# Patient Record
Sex: Male | Born: 1945
Health system: Southern US, Community
[De-identification: ages and names within clinical notes are randomized; demographics above are authoritative.]

## PROBLEM LIST (undated history)

## (undated) DIAGNOSIS — I493 Ventricular premature depolarization: Secondary | ICD-10-CM

## (undated) DIAGNOSIS — I10 Essential (primary) hypertension: Secondary | ICD-10-CM

## (undated) DIAGNOSIS — F32A Depression, unspecified: Secondary | ICD-10-CM

## (undated) DIAGNOSIS — F329 Major depressive disorder, single episode, unspecified: Secondary | ICD-10-CM

## (undated) DIAGNOSIS — F410 Panic disorder [episodic paroxysmal anxiety] without agoraphobia: Secondary | ICD-10-CM

## (undated) HISTORY — DX: Essential (primary) hypertension: I10

## (undated) HISTORY — PX: TYMPANIC MEMBRANE REPAIR: SHX294

## (undated) HISTORY — DX: Major depressive disorder, single episode, unspecified: F32.9

## (undated) HISTORY — DX: Depression, unspecified: F32.A

## (undated) HISTORY — PX: FRACTURE SURGERY: SHX138

## (undated) HISTORY — DX: Ventricular premature depolarization: I49.3

## (undated) HISTORY — DX: Panic disorder (episodic paroxysmal anxiety): F41.0

---

## 2006-05-23 ENCOUNTER — Ambulatory Visit: Payer: Self-pay | Admitting: Internal Medicine

## 2006-09-05 HISTORY — PX: EYE SURGERY: SHX253

## 2006-11-08 ENCOUNTER — Ambulatory Visit: Payer: Self-pay | Admitting: Unknown Physician Specialty

## 2010-01-11 ENCOUNTER — Ambulatory Visit: Payer: Self-pay | Admitting: Internal Medicine

## 2010-09-27 ENCOUNTER — Ambulatory Visit: Payer: Self-pay | Admitting: Internal Medicine

## 2011-05-10 ENCOUNTER — Other Ambulatory Visit: Payer: Self-pay | Admitting: Internal Medicine

## 2011-05-10 MED ORDER — CITALOPRAM HYDROBROMIDE 10 MG PO TABS: 10.0000 mg | ORAL_TABLET | Freq: Every day | ORAL | Status: DC

## 2011-05-10 NOTE — Telephone Encounter (Signed)
Please send refill for Citalopram 10 mg to [facility] in [place] [phone]

## 2011-06-01 ENCOUNTER — Other Ambulatory Visit: Payer: Self-pay | Admitting: Internal Medicine

## 2011-06-01 MED ORDER — FUROSEMIDE 20 MG PO TABS: 20.0000 mg | ORAL_TABLET | Freq: Every day | ORAL | Status: DC

## 2011-08-04 ENCOUNTER — Encounter: Payer: Self-pay | Admitting: Internal Medicine

## 2011-08-04 ENCOUNTER — Ambulatory Visit (INDEPENDENT_AMBULATORY_CARE_PROVIDER_SITE_OTHER): Payer: Self-pay | Admitting: Internal Medicine

## 2011-08-04 VITALS — BP 134/76 | HR 77 | Temp 97.6°F | Resp 16 | Ht 69.0 in | Wt 197.0 lb

## 2011-08-04 DIAGNOSIS — Z23 Encounter for immunization: Secondary | ICD-10-CM

## 2011-08-04 DIAGNOSIS — Z79899 Other long term (current) drug therapy: Secondary | ICD-10-CM

## 2011-08-04 DIAGNOSIS — Z13220 Encounter for screening for lipoid disorders: Secondary | ICD-10-CM

## 2011-08-04 DIAGNOSIS — Z1211 Encounter for screening for malignant neoplasm of colon: Secondary | ICD-10-CM

## 2011-08-04 DIAGNOSIS — M545 Low back pain: Secondary | ICD-10-CM

## 2011-08-04 DIAGNOSIS — Z125 Encounter for screening for malignant neoplasm of prostate: Secondary | ICD-10-CM

## 2011-08-04 DIAGNOSIS — R05 Cough: Secondary | ICD-10-CM

## 2011-08-04 DIAGNOSIS — F410 Panic disorder [episodic paroxysmal anxiety] without agoraphobia: Secondary | ICD-10-CM

## 2011-08-04 DIAGNOSIS — I10 Essential (primary) hypertension: Secondary | ICD-10-CM

## 2011-08-04 LAB — BASIC METABOLIC PANEL
BUN: 22 mg/dL (ref 6–23)
Calcium: 9.5 mg/dL (ref 8.4–10.5)
GFR: 70.66 mL/min (ref >60.00)
Glucose, Bld: 90 mg/dL (ref 70–99)

## 2011-08-04 LAB — HM COLONOSCOPY

## 2011-08-04 MED ORDER — LOSARTAN POTASSIUM 50 MG PO TABS: 50.0000 mg | ORAL_TABLET | Freq: Every day | ORAL | Status: DC

## 2011-08-04 NOTE — Progress Notes (Signed)
Subjective:    Patient ID: Austin Duran, male    DOB: 10-04-1945, 65 y.o.   MRN: [id number]  HPI  65 yo white male here for 6 month followup on hypertension, depression.  He has been having more lumbar back pain lately due to increased actitivity.  No radiculpathy or numbness, and pai is controlled with NSAIDS/analgesics OTC.  Worse with prlonged bending over /stooping.  N recent falls or accidents.  Past Medical History  Diagnosis Date  . Hearing loss     had a perforated right TM,   followed by Dr. Jenne Campus  . Panic attack   . Hypertension   . Depression   . Panic attacks     Current Outpatient Prescriptions on File Prior to Visit  Medication Sig Dispense Refill  . citalopram (CELEXA) 10 MG tablet Take 1 tablet (10 mg total) by mouth daily.  30 tablet  3  . furosemide (LASIX) 20 MG tablet Take 1 tablet (20 mg total) by mouth daily.  30 tablet  3     Review of Systems  Constitutional: Negative for fever, chills, diaphoresis, activity change, appetite change, fatigue and unexpected weight change.  HENT: Negative for hearing loss, ear pain, nosebleeds, congestion, sore throat, facial swelling, rhinorrhea, sneezing, drooling, mouth sores, trouble swallowing, neck pain, neck stiffness, dental problem, voice change, postnasal drip, sinus pressure, tinnitus and ear discharge.   Eyes: Negative for photophobia, pain, discharge, redness, itching and visual disturbance.  Respiratory: Negative for apnea, cough, choking, chest tightness, shortness of breath, wheezing and stridor.   Cardiovascular: Negative for chest pain, palpitations and leg swelling.  Gastrointestinal: Negative for nausea, vomiting, abdominal pain, diarrhea, constipation, blood in stool, abdominal distention, anal bleeding and rectal pain.  Genitourinary: Negative for dysuria, urgency, frequency, hematuria, flank pain, decreased urine volume, scrotal swelling, difficulty urinating and testicular pain.  Musculoskeletal: Positive  for back pain. Negative for myalgias, joint swelling, arthralgias and gait problem.  Skin: Negative for color change, rash and wound.  Neurological: Negative for dizziness, tremors, seizures, syncope, speech difficulty, weakness, light-headedness, numbness and headaches.  Psychiatric/Behavioral: Negative for suicidal ideas, hallucinations, behavioral problems, confusion, sleep disturbance, dysphoric mood, decreased concentration and agitation. The patient is not nervous/anxious.       BP 134/76  Pulse 77  Temp(Src) 97.6 F (36.4 C) (Oral)  Resp 16  Ht 5\' 9"  (1.753 m)  Wt 197 lb (89.359 kg)  BMI 29.09 kg/m2  SpO2 96%  Objective:   Physical Exam  Constitutional: He is oriented to person, place, and time.  HENT:  Head: Normocephalic and atraumatic.  Mouth/Throat: Oropharynx is clear and moist.  Eyes: Conjunctivae and EOM are normal.  Neck: Normal range of motion. Neck supple. No JVD present. No thyromegaly present.  Cardiovascular: Normal rate, regular rhythm and normal heart sounds.   Pulmonary/Chest: Effort normal and breath sounds normal. He has no wheezes. He has no rales.  Abdominal: Soft. Bowel sounds are normal. He exhibits no mass. There is no tenderness. There is no rebound.  Musculoskeletal: Normal range of motion. He exhibits no edema.  Neurological: He is alert and oriented to person, place, and time.  Skin: Skin is warm and dry.  Psychiatric: He has a normal mood and affect.       Assessment & Plan:   Screening for colon cancer He has refused colonoscopy.  Last FOBT that was actually returned was 2009, despite annual reminders.   Panic attacks Controlled with celexa.  No changes today.  Screening for hyperlipidemia Fasting  lipid panel was May 2012.  LDL was 84, HDL  Low at 32, trigs 95.  We have discussed the Mediterranean Diet in the past for lifestyle dietary changes which may improve his HDL.  Cough He was evaluated in May for cough with a normal chest x  ray.  Cough wasdue to ACE Inhibitor and has resolved with change in antihypertensive to Losartan  Lumbago Mild, episodic, without radiculopathy.  He has a history of cervical disk disease so likley has dgenerative changes in the lumbar spine as well.  Will defer workup at this time due to mildness of symptoms.   Continue NSAID/analgesics prn and continue regular exercise.     Updated Medication List Outpatient Encounter Prescriptions as of 08/04/2011  Medication Sig Dispense Refill  . aspirin 81 MG tablet Take 81 mg by mouth daily.        . citalopram (CELEXA) 10 MG tablet Take 1 tablet (10 mg total) by mouth daily.  30 tablet  3  . furosemide (LASIX) 20 MG tablet Take 1 tablet (20 mg total) by mouth daily.  30 tablet  3  . losartan (COZAAR) 50 MG tablet Take 1 tablet (50 mg total) by mouth daily.  90 tablet  3  . DISCONTD: losartan (COZAAR) 50 MG tablet Take 50 mg by mouth daily.

## 2011-08-07 ENCOUNTER — Encounter: Payer: Self-pay | Admitting: Internal Medicine

## 2011-08-07 DIAGNOSIS — F410 Panic disorder [episodic paroxysmal anxiety] without agoraphobia: Secondary | ICD-10-CM | POA: Insufficient documentation

## 2011-08-07 DIAGNOSIS — E785 Hyperlipidemia, unspecified: Secondary | ICD-10-CM | POA: Insufficient documentation

## 2011-08-07 DIAGNOSIS — Z125 Encounter for screening for malignant neoplasm of prostate: Secondary | ICD-10-CM | POA: Insufficient documentation

## 2011-08-07 DIAGNOSIS — M545 Low back pain: Secondary | ICD-10-CM | POA: Insufficient documentation

## 2011-08-07 DIAGNOSIS — R05 Cough: Secondary | ICD-10-CM | POA: Insufficient documentation

## 2011-08-07 DIAGNOSIS — Z1211 Encounter for screening for malignant neoplasm of colon: Secondary | ICD-10-CM | POA: Insufficient documentation

## 2011-08-07 NOTE — Assessment & Plan Note (Signed)
Fasting lipid panel was May 2012.  LDL was 84, HDL  Low at 32, trigs 95.  We have discussed the Mediterranean Diet in the past for lifestyle dietary changes which may improve his HDL.

## 2011-08-07 NOTE — Assessment & Plan Note (Signed)
Controlled with celexa.  No changes today.

## 2011-08-07 NOTE — Assessment & Plan Note (Signed)
He has refused colonoscopy.  Last FOBT that was actually returned was 2009, despite annual reminders.

## 2011-08-07 NOTE — Patient Instructions (Signed)
Your blood pressure is well controlled.  You are overdue for annual screening for colon CA using the take home fecal occult blood test.  Please do these as soon as possible.

## 2011-08-07 NOTE — Assessment & Plan Note (Signed)
He was evaluated in May for cough with a normal chest x ray.  Cough wasdue to ACE Inhibitor and has resolved with change in antihypertensive to Losartan

## 2011-08-07 NOTE — Assessment & Plan Note (Signed)
Mild, episodic, without radiculopathy.  He has a history of cervical disk disease so likley has dgenerative changes in the lumbar spine as well.  Will defer workup at this time due to mildness of symptoms.   Continue NSAID/analgesics prn and continue regular exercise.

## 2011-08-24 ENCOUNTER — Encounter: Payer: Self-pay | Admitting: Internal Medicine

## 2011-09-02 ENCOUNTER — Other Ambulatory Visit: Payer: Self-pay | Admitting: Internal Medicine

## 2011-09-02 MED ORDER — ALPRAZOLAM 0.5 MG PO TABS: 0.5000 mg | ORAL_TABLET | Freq: Two times a day (BID) | ORAL | Status: AC | PRN

## 2011-09-02 MED ORDER — CITALOPRAM HYDROBROMIDE 20 MG PO TABS: 20.0000 mg | ORAL_TABLET | Freq: Every day | ORAL | Status: DC

## 2011-09-02 NOTE — Telephone Encounter (Signed)
Patient's wife wants to know if you can bump up his Citalopram he has had several anxiety attacks over the last couple of weeks.

## 2011-09-02 NOTE — Telephone Encounter (Signed)
Spoke with patient and confirmed that he is having some panic attack issues. He has been very nervous and unable to sit still or concentrate. He said he almost feels claustrophobic in his car as well. He denies CP,SOB,diaphoresis, SI or HI, no thoughts of impending doom. He said he has been taking an extra citalopram when he has these attacks and hasn't noticed much difference. I explained that the medicine needed to build up in his system in order to notice any changes and that there may be something you could send in for immediate relief until increase in citalopram could get in his system, if you okayed the change. I explained that it may be Monday before he hears back from [place], but to check with his pharmacy tomorrow about any new meds. He verbalized understanding.

## 2011-09-02 NOTE — Telephone Encounter (Signed)
I really cannot adjust a patient's SSRI dose based on a request from his wife unless he is incompetent, which is not the case.  Can you call the patient to confirm that he is requesting a change in medication? thanks

## 2011-09-02 NOTE — Telephone Encounter (Signed)
Increase the citalopram to 20 mg daily , but this will not help immediately.  He needs an rx for alprazolam 0.5 mg one tablet twice daily if needed for panic attacks.  #60 no refills.  You can call to pharmacy.  I will update EPIC.

## 2011-09-02 NOTE — Telephone Encounter (Signed)
Rx called in as directed. Patient notified.  

## 2011-10-06 ENCOUNTER — Telehealth: Payer: Self-pay | Admitting: Internal Medicine

## 2011-10-06 NOTE — Telephone Encounter (Signed)
Pt has cough with green mucous, sinus pain.  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

## 2011-10-06 NOTE — Telephone Encounter (Signed)
Yes, please schedule for Friday

## 2011-10-06 NOTE — Telephone Encounter (Signed)
yes

## 2011-10-06 NOTE — Telephone Encounter (Signed)
Vernona Rieger when can i put this pt in on Friday  Thanks  robin

## 2011-10-06 NOTE — Telephone Encounter (Signed)
10:45

## 2011-10-06 NOTE — Telephone Encounter (Signed)
Patient is coughing up dark mucous and headache.Patient is wanting to be seen on Friday.

## 2011-10-07 ENCOUNTER — Encounter: Payer: Self-pay | Admitting: Internal Medicine

## 2011-10-07 ENCOUNTER — Ambulatory Visit (INDEPENDENT_AMBULATORY_CARE_PROVIDER_SITE_OTHER): Payer: Medicare Other | Admitting: Internal Medicine

## 2011-10-07 VITALS — BP 132/78 | HR 76 | Temp 97.8°F | Wt 206.0 lb

## 2011-10-07 DIAGNOSIS — H6690 Otitis media, unspecified, unspecified ear: Secondary | ICD-10-CM

## 2011-10-07 DIAGNOSIS — J0100 Acute maxillary sinusitis, unspecified: Secondary | ICD-10-CM | POA: Diagnosis not present

## 2011-10-07 MED ORDER — PREDNISONE (PAK) 10 MG PO TABS: ORAL_TABLET | ORAL | Status: AC

## 2011-10-07 MED ORDER — AMOXICILLIN-POT CLAVULANATE 875-125 MG PO TABS: 1.0000 | ORAL_TABLET | Freq: Two times a day (BID) | ORAL | Status: AC

## 2011-10-07 NOTE — Patient Instructions (Signed)
Have bronchitis and sinusitis/otitis .[name]  I am prescribing augmentin twice daily and prednisone taper  For 6 -7 days until finished.  For the first few days  Take OTC Sudafed PE  (10 mg every 6 hours unitl evening) for congestion ,  Then stop.    If still congested at night,  Use Afrin nasal spray for 4 to 5 days maximum   Ok to use Simply Saline nasal spray twice daily to flush sinuses (use before the afrin )   Use the benzonatate capsules for cough every 8 hours if needed

## 2011-10-07 NOTE — Progress Notes (Signed)
  Subjective:    Patient ID: Austin Duran, male    DOB: March 27, 1946, 66 y.o.   MRN: [id number]  HPI  is a 66 year old white male with a history of hypertension depression with anxiety attacks presents with a 2 week history of sinus congestion rhinitis postnasal drip headaches with more recent development of pain occurring under the left eye  accompanied by purulent blood-streaked mucoid discharge he has been using over-the-counter  Remedies Coricidin and Mucinex DM without any significant change in his symptoms. He denies fevers nausea vomiting or diarrhea. He did have an influenza vaccine this year and has had no recent sick contacts. Past Medical History  Diagnosis Date  . Hearing loss     had a perforated right TM,   followed by Dr. Jenne Campus  . Panic attack   . Hypertension   . Depression   . Panic attacks    Current Outpatient Prescriptions on File Prior to Visit  Medication Sig Dispense Refill  . aspirin 81 MG tablet Take 81 mg by mouth daily.        . citalopram (CELEXA) 20 MG tablet Take 1 tablet (20 mg total) by mouth daily.  30 tablet  3  . furosemide (LASIX) 20 MG tablet Take 1 tablet (20 mg total) by mouth daily.  30 tablet  3  . losartan (COZAAR) 50 MG tablet Take 1 tablet (50 mg total) by mouth daily.  90 tablet  3    Review of Systems  Constitutional: Negative for fever, chills, activity change and fatigue.  HENT: Positive for ear pain, congestion, postnasal drip and sinus pressure. Negative for hearing loss, nosebleeds, sore throat, rhinorrhea, sneezing, trouble swallowing, neck pain, neck stiffness, voice change, tinnitus and ear discharge.   Eyes: Negative for discharge, redness, itching and visual disturbance.  Respiratory: Negative for cough, chest tightness, shortness of breath, wheezing and stridor.   Cardiovascular: Negative for chest pain and leg swelling.  Musculoskeletal: Negative for myalgias and arthralgias.  Skin: Negative for color change and rash.    Neurological: Negative for dizziness, facial asymmetry and headaches.  Psychiatric/Behavioral: Negative for sleep disturbance.       Objective:   Physical Exam  Constitutional: He is oriented to person, place, and time.  HENT:  Head: Normocephalic and atraumatic.  Right Ear: Tympanic membrane is injected. A middle ear effusion is present.  Left Ear: Tympanic membrane is injected. A middle ear effusion is present.  Nose: Right sinus exhibits maxillary sinus tenderness. Left sinus exhibits maxillary sinus tenderness.  Mouth/Throat: Oropharynx is clear and moist.  Eyes: Conjunctivae and EOM are normal.  Neck: Normal range of motion. Neck supple. No JVD present. No thyromegaly present.  Cardiovascular: Normal rate, regular rhythm and normal heart sounds.   Pulmonary/Chest: Effort normal and breath sounds normal. He has no wheezes. He has no rales.  Abdominal: Soft. Bowel sounds are normal. He exhibits no mass. There is no tenderness. There is no rebound.  Musculoskeletal: Normal range of motion. He exhibits no edema.  Neurological: He is alert and oriented to person, place, and time.  Skin: Skin is warm and dry.  Psychiatric: He has a normal mood and affect.      Assessment & Plan:  Sinusitis with otitis: will treat with prednisone and augmentin, OTC sudafed PE if tolerated by BP

## 2011-10-07 NOTE — Telephone Encounter (Signed)
appt 10/07/11 @ 4:30 ok per laurie

## 2011-10-09 ENCOUNTER — Encounter: Payer: Self-pay | Admitting: Internal Medicine

## 2011-10-09 DIAGNOSIS — J0100 Acute maxillary sinusitis, unspecified: Secondary | ICD-10-CM | POA: Insufficient documentation

## 2011-10-10 ENCOUNTER — Ambulatory Visit: Payer: BC Managed Care – PPO | Admitting: Internal Medicine

## 2011-10-10 ENCOUNTER — Other Ambulatory Visit: Payer: Self-pay | Admitting: *Deleted

## 2011-10-10 MED ORDER — FUROSEMIDE 20 MG PO TABS: 20.0000 mg | ORAL_TABLET | Freq: Every day | ORAL | Status: DC

## 2011-10-11 ENCOUNTER — Ambulatory Visit: Payer: BC Managed Care – PPO | Admitting: Internal Medicine

## 2011-10-28 DIAGNOSIS — H2510 Age-related nuclear cataract, unspecified eye: Secondary | ICD-10-CM | POA: Diagnosis not present

## 2011-11-04 DIAGNOSIS — L819 Disorder of pigmentation, unspecified: Secondary | ICD-10-CM | POA: Diagnosis not present

## 2011-11-04 DIAGNOSIS — D239 Other benign neoplasm of skin, unspecified: Secondary | ICD-10-CM | POA: Diagnosis not present

## 2011-11-04 DIAGNOSIS — Z85828 Personal history of other malignant neoplasm of skin: Secondary | ICD-10-CM | POA: Diagnosis not present

## 2011-11-25 DIAGNOSIS — H2510 Age-related nuclear cataract, unspecified eye: Secondary | ICD-10-CM | POA: Diagnosis not present

## 2011-12-06 ENCOUNTER — Ambulatory Visit: Payer: Self-pay | Admitting: Ophthalmology

## 2011-12-06 DIAGNOSIS — I10 Essential (primary) hypertension: Secondary | ICD-10-CM | POA: Diagnosis not present

## 2011-12-06 DIAGNOSIS — F329 Major depressive disorder, single episode, unspecified: Secondary | ICD-10-CM | POA: Diagnosis not present

## 2011-12-06 DIAGNOSIS — R12 Heartburn: Secondary | ICD-10-CM | POA: Diagnosis not present

## 2011-12-06 DIAGNOSIS — M19029 Primary osteoarthritis, unspecified elbow: Secondary | ICD-10-CM | POA: Diagnosis not present

## 2011-12-06 DIAGNOSIS — G4733 Obstructive sleep apnea (adult) (pediatric): Secondary | ICD-10-CM | POA: Diagnosis not present

## 2011-12-06 DIAGNOSIS — K219 Gastro-esophageal reflux disease without esophagitis: Secondary | ICD-10-CM | POA: Diagnosis not present

## 2011-12-06 DIAGNOSIS — Z9109 Other allergy status, other than to drugs and biological substances: Secondary | ICD-10-CM | POA: Diagnosis not present

## 2011-12-06 DIAGNOSIS — F410 Panic disorder [episodic paroxysmal anxiety] without agoraphobia: Secondary | ICD-10-CM | POA: Diagnosis not present

## 2011-12-06 DIAGNOSIS — H2510 Age-related nuclear cataract, unspecified eye: Secondary | ICD-10-CM | POA: Diagnosis not present

## 2011-12-06 DIAGNOSIS — Z87891 Personal history of nicotine dependence: Secondary | ICD-10-CM | POA: Diagnosis not present

## 2011-12-06 DIAGNOSIS — Z79899 Other long term (current) drug therapy: Secondary | ICD-10-CM | POA: Diagnosis not present

## 2011-12-06 DIAGNOSIS — M19019 Primary osteoarthritis, unspecified shoulder: Secondary | ICD-10-CM | POA: Diagnosis not present

## 2011-12-06 DIAGNOSIS — Z7982 Long term (current) use of aspirin: Secondary | ICD-10-CM | POA: Diagnosis not present

## 2012-01-02 ENCOUNTER — Encounter: Payer: Self-pay | Admitting: Internal Medicine

## 2012-01-02 ENCOUNTER — Ambulatory Visit (INDEPENDENT_AMBULATORY_CARE_PROVIDER_SITE_OTHER): Payer: Medicare Other | Admitting: Internal Medicine

## 2012-01-02 VITALS — BP 126/74 | HR 70 | Temp 98.3°F | Resp 16 | Ht 69.0 in | Wt 206.0 lb

## 2012-01-02 DIAGNOSIS — Z0000 Encounter for general adult medical examination without abnormal findings: Secondary | ICD-10-CM | POA: Diagnosis not present

## 2012-01-02 DIAGNOSIS — R5381 Other malaise: Secondary | ICD-10-CM

## 2012-01-02 DIAGNOSIS — E785 Hyperlipidemia, unspecified: Secondary | ICD-10-CM | POA: Diagnosis not present

## 2012-01-02 DIAGNOSIS — E669 Obesity, unspecified: Secondary | ICD-10-CM | POA: Diagnosis not present

## 2012-01-02 DIAGNOSIS — R5383 Other fatigue: Secondary | ICD-10-CM

## 2012-01-02 DIAGNOSIS — Z125 Encounter for screening for malignant neoplasm of prostate: Secondary | ICD-10-CM

## 2012-01-02 DIAGNOSIS — C44519 Basal cell carcinoma of skin of other part of trunk: Secondary | ICD-10-CM | POA: Insufficient documentation

## 2012-01-02 DIAGNOSIS — Z9841 Cataract extraction status, right eye: Secondary | ICD-10-CM

## 2012-01-02 DIAGNOSIS — H905 Unspecified sensorineural hearing loss: Secondary | ICD-10-CM | POA: Insufficient documentation

## 2012-01-02 DIAGNOSIS — H903 Sensorineural hearing loss, bilateral: Secondary | ICD-10-CM | POA: Insufficient documentation

## 2012-01-02 DIAGNOSIS — R05 Cough: Secondary | ICD-10-CM

## 2012-01-02 DIAGNOSIS — Z79899 Other long term (current) drug therapy: Secondary | ICD-10-CM

## 2012-01-02 LAB — CBC WITH DIFFERENTIAL/PLATELET
Basophils Relative: 0.7 % (ref 0.0–3.0)
Eosinophils Relative: 2.2 % (ref 0.0–5.0)
HCT: 45.3 % (ref 39.0–52.0)
Hemoglobin: 15.3 g/dL (ref 13.0–17.0)
Lymphs Abs: 1.5 10*3/uL (ref 0.7–4.0)
MCV: 90.9 fl (ref 78.0–100.0)
Monocytes Absolute: 0.6 10*3/uL (ref 0.1–1.0)
Neutro Abs: 3.7 10*3/uL (ref 1.4–7.7)
Platelets: 285 10*3/uL (ref 150.0–400.0)
RBC: 4.99 Mil/uL (ref 4.22–5.81)
WBC: 6 10*3/uL (ref 4.5–10.5)

## 2012-01-02 LAB — TSH: TSH: 1.08 u[IU]/mL (ref 0.35–5.50)

## 2012-01-02 LAB — LIPID PANEL
Cholesterol: 131 mg/dL (ref 0–200)
LDL Cholesterol: 84 mg/dL (ref 0–99)
VLDL: 19.2 mg/dL (ref 0.0–40.0)

## 2012-01-02 MED ORDER — BENZONATATE 200 MG PO CAPS: 200.0000 mg | ORAL_CAPSULE | Freq: Three times a day (TID) | ORAL | Status: DC | PRN

## 2012-01-02 NOTE — Assessment & Plan Note (Signed)
I have addressed  BMI and recommended a low glycemic index diet utilizing smaller more frequent meals to increase metabolism.  I have also recommended that patient start exercising with a goal of 30 minutes of aerobic exercise a minimum of 5 days per week. Screening for lipid disorders, thyroid and diabetes to be done today.   

## 2012-01-02 NOTE — Progress Notes (Signed)
Patient ID: Austin Duran, male   DOB: 12-28-1945, 66 y.o.   MRN: [id number]  The patient is here for annual Medicare wellness examination and management of other chronic and acute problems.   The risk factors are reflected in the social history.  The roster of all physicians providing medical care to patient - is listed in the Snapshot section of the chart.  Activities of daily living:  The patient is 100% independent in all ADLs: dressing, toileting, feeding as well as independent mobility  Home safety : The patient has smoke detectors in the home. They wear seatbelts.  There are no firearms at home. There is no violence in the home.   There is no risks for hepatitis, STDs or HIV. There is no   history of blood transfusion. They have no travel history to infectious disease endemic areas of the world.  The patient has seen their dentist in the last six month. They have seen their eye doctor in the last year. They admit to slight hearing difficulty with regard to whispered voices and some television programs.  They have deferred audiologic testing in the last year.  They do not  have excessive sun exposure. Discussed the need for sun protection: hats, long sleeves and use of sunscreen if there is significant sun exposure.   Diet: the importance of a healthy diet is discussed. They do have a healthy diet.  The benefits of regular aerobic exercise were discussed. She walks 4 times per week ,  20 minutes.   Depression screen: there are no signs or vegative symptoms of depression- irritability, change in appetite, anhedonia, sadness/tearfullness. He has a history of panic attacks, controled with daily citalopram.   Cognitive assessment: the patient manages all their financial and personal affairs and is actively engaged. They could relate day,date,year and events; recalled 2/3 objects at 3 minutes; performed clock-face test normally.  The following portions of the patient's history were reviewed and  updated as appropriate: allergies, current medications, past family history, past medical history,  past surgical history, past social history  and problem list.  Visual acuity was not assessed per patient preference since he has regular follow up with her ophthalmologist and has undergone cataract surgery recently . 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  body mass index were assessed and reviewed.   During the course of the visit the patient was educated and counseled about appropriate screening and preventive services including : fall prevention , diabetes screening, nutrition counseling, colorectal cancer screening, and recommended immunizations.    BP 126/74  Pulse 70  Temp(Src) 98.3 F (36.8 C) (Oral)  Resp 16  Ht 5\' 9"  (1.753 m)  Wt 206 lb (93.441 kg)  BMI 30.42 kg/m2  SpO2 97%  .BP 126/74  Pulse 70  Temp(Src) 98.3 F (36.8 C) (Oral)  Resp 16  Ht 5\' 9"  (1.753 m)  Wt 206 lb (93.441 kg)  BMI 30.42 kg/m2  SpO2 97%  General Appearance:    Alert, cooperative, no distress, appears stated age  Head:    Normocephalic, without obvious abnormality, atraumatic  Eyes:    PERRL, conjunctiva/corneas clear, EOM's intact, fundi    benign, both eyes       Ears:    Normal TM's and external ear canals, both ears  Nose:   Nares normal, septum midline, mucosa normal, no drainage   or sinus tenderness  Throat:   Lips, mucosa, and  tongue normal; teeth and gums normal  Neck:   Supple, symmetrical, trachea midline, no adenopathy;       thyroid:  No enlargement/tenderness/nodules; no carotid   bruit or JVD  Back:     Symmetric, no curvature, ROM normal, no CVA tenderness  Lungs:     Clear to auscultation bilaterally, respirations unlabored  Chest wall:    No tenderness or deformity  Heart:    Regular rate and rhythm, S1 and S2 normal, no murmur, rub   or gallop  Abdomen:     Soft, non-tender, bowel sounds  active all four quadrants,    no masses, no organomegaly  Genitalia:    Normal male without lesion, discharge or tenderness  Rectal:    Normal tone, normal prostate, no masses or tenderness;   guaiac negative stool  Extremities:   Extremities normal, atraumatic, no cyanosis or edema  Pulses:   2+ and symmetric all extremities  Skin:   Skin color, texture, turgor normal, no rashes or lesions  Lymph nodes:   Cervical, supraclavicular, and axillary nodes normal  Neurologic:   CNII-XII intact. Normal strength, sensation and reflexes      throughout   Assessment and Plan:  Basal cell carcinoma of back Taken  Off of back by Diona Browner last year  Encounter for Medicare annual wellness exam Prostate, testicular/genital exam and hernia check all done today . PSA drawn .  Obesity (BMI 30-39.9) I have addressed  BMI and recommended a low glycemic index diet utilizing smaller more frequent meals to increase metabolism.  I have also recommended that patient start exercising with a goal of 30 minutes of aerobic exercise a minimum of 5 days per week. Screening for lipid disorders, thyroid and diabetes to be done today.       Updated Medication List Outpatient Encounter Prescriptions as of 01/02/2012  Medication Sig Dispense Refill  . aspirin 81 MG tablet Take 81 mg by mouth daily.        . citalopram (CELEXA) 20 MG tablet Take 1 tablet (20 mg total) by mouth daily.  30 tablet  3  . furosemide (LASIX) 20 MG tablet Take 1 tablet (20 mg total) by mouth daily.  30 tablet  3  . losartan (COZAAR) 50 MG tablet Take 1 tablet (50 mg total) by mouth daily.  90 tablet  3  . benzonatate (TESSALON) 200 MG capsule Take 1 capsule (200 mg total) by mouth 3 (three) times daily as needed for cough.  60 capsule  1

## 2012-01-02 NOTE — Assessment & Plan Note (Signed)
Prostate, testicular/genital exam and hernia check all done today . PSA drawn .

## 2012-01-02 NOTE — Assessment & Plan Note (Signed)
Taken  Off of back by [employer] last year

## 2012-01-03 LAB — COMPLETE METABOLIC PANEL WITH GFR
AST: 19 U/L (ref 0–37)
Albumin: 4.5 g/dL (ref 3.5–5.2)
Alkaline Phosphatase: 71 U/L (ref 39–117)
BUN: 16 mg/dL (ref 6–23)
Potassium: 4.3 mEq/L (ref 3.5–5.3)
Total Bilirubin: 0.8 mg/dL (ref 0.3–1.2)

## 2012-01-04 HISTORY — PX: CATARACT EXTRACTION, BILATERAL: SHX1313

## 2012-01-09 DIAGNOSIS — H2510 Age-related nuclear cataract, unspecified eye: Secondary | ICD-10-CM | POA: Diagnosis not present

## 2012-01-10 ENCOUNTER — Other Ambulatory Visit: Payer: Self-pay | Admitting: *Deleted

## 2012-01-10 MED ORDER — CITALOPRAM HYDROBROMIDE 20 MG PO TABS: 20.0000 mg | ORAL_TABLET | Freq: Every day | ORAL | Status: DC

## 2012-01-17 ENCOUNTER — Ambulatory Visit: Payer: Self-pay | Admitting: Ophthalmology

## 2012-01-17 DIAGNOSIS — I10 Essential (primary) hypertension: Secondary | ICD-10-CM | POA: Diagnosis not present

## 2012-01-17 DIAGNOSIS — M19019 Primary osteoarthritis, unspecified shoulder: Secondary | ICD-10-CM | POA: Diagnosis not present

## 2012-01-17 DIAGNOSIS — H9190 Unspecified hearing loss, unspecified ear: Secondary | ICD-10-CM | POA: Diagnosis not present

## 2012-01-17 DIAGNOSIS — F3289 Other specified depressive episodes: Secondary | ICD-10-CM | POA: Diagnosis not present

## 2012-01-17 DIAGNOSIS — Z7982 Long term (current) use of aspirin: Secondary | ICD-10-CM | POA: Diagnosis not present

## 2012-01-17 DIAGNOSIS — J449 Chronic obstructive pulmonary disease, unspecified: Secondary | ICD-10-CM | POA: Diagnosis not present

## 2012-01-17 DIAGNOSIS — G4733 Obstructive sleep apnea (adult) (pediatric): Secondary | ICD-10-CM | POA: Diagnosis not present

## 2012-01-17 DIAGNOSIS — Z79899 Other long term (current) drug therapy: Secondary | ICD-10-CM | POA: Diagnosis not present

## 2012-01-17 DIAGNOSIS — Z87891 Personal history of nicotine dependence: Secondary | ICD-10-CM | POA: Diagnosis not present

## 2012-01-17 DIAGNOSIS — M19029 Primary osteoarthritis, unspecified elbow: Secondary | ICD-10-CM | POA: Diagnosis not present

## 2012-01-17 DIAGNOSIS — K219 Gastro-esophageal reflux disease without esophagitis: Secondary | ICD-10-CM | POA: Diagnosis not present

## 2012-01-17 DIAGNOSIS — H2510 Age-related nuclear cataract, unspecified eye: Secondary | ICD-10-CM | POA: Diagnosis not present

## 2012-01-17 DIAGNOSIS — F410 Panic disorder [episodic paroxysmal anxiety] without agoraphobia: Secondary | ICD-10-CM | POA: Diagnosis not present

## 2012-02-09 ENCOUNTER — Telehealth: Payer: Self-pay | Admitting: Internal Medicine

## 2012-02-09 MED ORDER — FUROSEMIDE 20 MG PO TABS: 20.0000 mg | ORAL_TABLET | Freq: Every day | ORAL | Status: DC

## 2012-02-09 NOTE — Telephone Encounter (Signed)
Rx called in, patient notified.

## 2012-02-09 NOTE — Telephone Encounter (Signed)
Austin Duran called checking on rx for furosemide  [name] in [place] told pt this refill request was sent in  On Monday Pt has enough to get through sat Please advise

## 2012-04-05 HISTORY — PX: CATARACT EXTRACTION: SUR2

## 2012-05-02 ENCOUNTER — Other Ambulatory Visit: Payer: Self-pay | Admitting: Internal Medicine

## 2012-05-02 MED ORDER — CITALOPRAM HYDROBROMIDE 20 MG PO TABS: 20.0000 mg | ORAL_TABLET | Freq: Every day | ORAL | Status: DC

## 2012-06-08 ENCOUNTER — Telehealth: Payer: Self-pay | Admitting: Internal Medicine

## 2012-06-08 MED ORDER — FUROSEMIDE 20 MG PO TABS: 20.0000 mg | ORAL_TABLET | Freq: Every day | ORAL | Status: DC

## 2012-06-08 NOTE — Telephone Encounter (Signed)
Rx sent to [facility] pharmacy. Pt informed via VM.

## 2012-06-08 NOTE — Telephone Encounter (Signed)
Patients wife called in stating that [name] was suppose to send request for his furosemide on Sunday. I advised patient that we haven't gotten any request from them but I would take a phone note. Their number is [phone].

## 2012-06-08 NOTE — Telephone Encounter (Signed)
Lasix oral tablet 20 mg Sig: take one tablet by mouth one time daily.  This is a duplicate, and has already been taken care of.

## 2012-06-08 NOTE — Telephone Encounter (Signed)
Please call in prescription for furosemide to [facility] [place] at [phone]. Patient's wife states they faxed [place] request on Sunday, I advised we didn't receive this.

## 2012-06-21 DIAGNOSIS — L919 Hypertrophic disorder of the skin, unspecified: Secondary | ICD-10-CM | POA: Diagnosis not present

## 2012-06-21 DIAGNOSIS — L819 Disorder of pigmentation, unspecified: Secondary | ICD-10-CM | POA: Diagnosis not present

## 2012-06-21 DIAGNOSIS — D239 Other benign neoplasm of skin, unspecified: Secondary | ICD-10-CM | POA: Diagnosis not present

## 2012-06-21 DIAGNOSIS — Z85828 Personal history of other malignant neoplasm of skin: Secondary | ICD-10-CM | POA: Diagnosis not present

## 2012-07-04 ENCOUNTER — Encounter: Payer: Self-pay | Admitting: Internal Medicine

## 2012-07-04 ENCOUNTER — Telehealth: Payer: Self-pay

## 2012-07-04 ENCOUNTER — Ambulatory Visit (INDEPENDENT_AMBULATORY_CARE_PROVIDER_SITE_OTHER): Payer: Medicare Other | Admitting: Internal Medicine

## 2012-07-04 VITALS — BP 122/70 | HR 65 | Temp 97.6°F | Resp 12 | Ht 67.5 in | Wt 208.5 lb

## 2012-07-04 DIAGNOSIS — Z9849 Cataract extraction status, unspecified eye: Secondary | ICD-10-CM | POA: Diagnosis not present

## 2012-07-04 DIAGNOSIS — Z1211 Encounter for screening for malignant neoplasm of colon: Secondary | ICD-10-CM | POA: Diagnosis not present

## 2012-07-04 DIAGNOSIS — Z23 Encounter for immunization: Secondary | ICD-10-CM | POA: Diagnosis not present

## 2012-07-04 DIAGNOSIS — I10 Essential (primary) hypertension: Secondary | ICD-10-CM | POA: Diagnosis not present

## 2012-07-04 DIAGNOSIS — Z79899 Other long term (current) drug therapy: Secondary | ICD-10-CM

## 2012-07-04 DIAGNOSIS — E785 Hyperlipidemia, unspecified: Secondary | ICD-10-CM

## 2012-07-04 DIAGNOSIS — Z9841 Cataract extraction status, right eye: Secondary | ICD-10-CM

## 2012-07-04 DIAGNOSIS — E669 Obesity, unspecified: Secondary | ICD-10-CM

## 2012-07-04 LAB — BASIC METABOLIC PANEL
BUN: 15 mg/dL (ref 6–23)
CO2: 27 mEq/L (ref 19–32)
Calcium: 9.1 mg/dL (ref 8.4–10.5)
Creatinine, Ser: 1.2 mg/dL (ref 0.4–1.5)
Glucose, Bld: 102 mg/dL — ABNORMAL HIGH (ref 70–99)

## 2012-07-04 MED ORDER — CITALOPRAM HYDROBROMIDE 20 MG PO TABS: 20.0000 mg | ORAL_TABLET | Freq: Every day | ORAL | Status: DC

## 2012-07-04 MED ORDER — FUROSEMIDE 20 MG PO TABS: 20.0000 mg | ORAL_TABLET | Freq: Every day | ORAL | Status: DC

## 2012-07-04 MED ORDER — LOSARTAN POTASSIUM 50 MG PO TABS: 50.0000 mg | ORAL_TABLET | Freq: Every day | ORAL | Status: DC

## 2012-07-04 MED ORDER — ZOSTER VACCINE LIVE 19400 UNT/0.65ML ~~LOC~~ SOLR: 0.6500 mL | Freq: Once | SUBCUTANEOUS | Status: DC

## 2012-07-04 NOTE — Addendum Note (Signed)
Addended by: Sherlene Shams on: 07/04/2012 02:35 PM   Modules accepted: Orders

## 2012-07-04 NOTE — Patient Instructions (Addendum)
Your goal weight is 197 lbs to bring your BMI down to < 30   You cna ge3t the shingles vaccine at any participating pharmacy with the prescripiton I have provided.   This is  Dr. Melina Schools version of a  "Low GI"  Diet:  All of the foods can be found at grocery stores and in bulk at [employer].  The Atkins protein bars and shakes are available in more varieties at [employer], [employer] and [employer].     7 AM Breakfast:  Low carbohydrate Protein  Shakes (I recommend the EAS AdvantEdge "Carb Control" shakes  Or the low carb shakes by Atkins.   Both are available everywhere:  In  cases at [employer]Js  Or in 4 packs at grocery stores and pharmacies  2.5 carbs  (Alternative is  a toasted Arnold's Sandwhich Thin w/ peanut butter, a "Bagel Thin" with cream cheese and salmon) or  a scrambled egg burrito made with a low carb tortilla .  Avoid cereal and bananas, oatmeal too unless you are cooking the old fashioned kind that takes 30-40 minutes to prepare.  the rest is overly processed, has minimal fiber, and is loaded with carbohydrates!   10 AM: Protein bar by Atkins (the snack size, under 200 cal).  There are many varieties , available widely again or in bulk in limited varieties at [employer]Js)  Other so called "protein bars" tend to be loaded with carbohydrates.  Remember, in food advertising, the word "energy" is synonymous for " carbohydrate."  Lunch: sandwich of [place], (or any lunchmeat, grilled meat or canned tuna), fresh avocado, mayonnaise  and cheese on a lower carbohydrate pita bread, flatbread, or tortilla . Ok to use regular mayonnaise. The bread is the only source or carbohydrate that can be decreased (Joseph's makes a pita bread and a flat bread that are 50 cal and 4 net carbs ; Toufayan makes a low carb flatbread that's 100 cal and 9 net carbs  and  [place]ission makes a low carb whole wheat tortilla  That is 210 cal and 6 net carbs)  3 PM:  Mid day :  Another protein bar,  Or a  cheese stick (100 cal, 0 carbs),  Or 1  ounce of  almonds, walnuts, pistachios, pecans, peanuts,  Macadamia nuts. Or a Dannon light n Fit greek yogurt, 80 cal 8 net carbs . Avoid "granola"; the dried cranberries and raisins are loaded with carbohydrates. Mixed nuts ok if no raisins or cranberries or dried fruit.      6 PM  Dinner:  "mean and green:"  Meat/chicken/fish or a high protein legume; , with a green salad, and a low GI  Veggie (broccoli, cauliflower, green beans, spinach, brussel sprouts. Lima beans) : Avoid "Low fat dressings, as well as Reyne Dumas and [address]! They are loaded with sugar! Instead use ranch, vinagrette,  Blue cheese, etc  9 PM snack : Breyer's "low carb" fudgsicle or  ice cream bar (Carb Smart line), or  Weight Watcher's ice cream bar , or another "no sugar added" ice cream;a serving of fresh berries/cherries with whipped cream (Avoid bananas, pineapple, grapes  and watermelon on a regular basis because they are high in sugar)   Remember that snack Substitutions should be less than 15 to 20 carbs  Per serving. Remember to subtract fiber grams and sugar alcohols to get the "net carbs."

## 2012-07-04 NOTE — Progress Notes (Signed)
Patient ID: Austin Duran, male   DOB: [birth year], 66 y.o.   MRN: [id number]    Patient Active Problem List  Diagnosis  . Panic attacks  . Screening for colon cancer  . Screening for prostate cancer  . Dyslipidemia with elevated low density lipoprotein cholesterol and abnormally low high density lipoprotein cholesterol  . Cough  . Lumbago  . Sinusitis, acute maxillary  . Basal cell carcinoma of back  . Hearing loss, sensorineural, asymmetrical  . S/P bilateral cataract extraction  . Encounter for Medicare annual wellness exam  . Obesity (BMI 30-39.9)  . Hypertension    Subjective:  CC:   Chief Complaint  Patient presents with  . Follow-up    HPI:   Austin Duran a 66 y.o. male who presents 6 month follow up on hypertension, hyperlipidemia and other chronic problems.  He has had cataract surgery by Dr. Druscilla Brownie with excellent results.  His vision has been restored to normal .  He has no abnormal joint pain.  He exercises twice a week by walking on a treadmill and lifting weights. Has not retired yet.  Not interested in taking more medications for low HDL. Sleeping well.   Past Medical History  Diagnosis Date  . Hearing loss     had a perforated right TM,   followed by Dr. Jenne Campus  . Panic attack   . Hypertension   . Depression   . Panic attacks     Past Surgical History  Procedure Date  . Tympanic membrane repair     [facility] , right   . Fracture surgery   . Eye surgery 2008    eyelid surgery, Dr. Kemper Durie  . Cataract extraction, bilateral May 2013    Porfilio         The following portions of the patient's history were reviewed and updated as appropriate: Allergies, current medications, and problem list.    Review of Systems:   12 Pt  review of systems was negative except those addressed in the HPI,     History   Social History  . Marital Status: Married    Spouse Name: N/A    Number of Children: N/A  . Years of Education: N/A    Occupational History  . Not on file.   Social History Main Topics  . Smoking status: Former Smoker    Quit date: 08/03/2004  . Smokeless tobacco: Never Used  . Alcohol Use: 2.5 oz/week    5 drink(s) per week  . Drug Use: No  . Sexually Active: Not on file   Other Topics Concern  . Not on file   Social History Narrative  . No narrative on file    Objective:  BP 122/70  Pulse 65  Temp 97.6 F (36.4 C) (Oral)  Resp 12  Ht 5' 7.5" (1.715 m)  Wt 208 lb 8 oz (94.575 kg)  BMI 32.17 kg/m2  SpO2 96%  General appearance: alert, cooperative and appears stated age Ears: normal TM's and external ear canals both ears Throat: lips, mucosa, and tongue normal; teeth and gums normal Neck: no adenopathy, no carotid bruit, supple, symmetrical, trachea midline and thyroid not enlarged, symmetric, no tenderness/mass/nodules Back: symmetric, no curvature. ROM normal. No CVA tenderness. Lungs: clear to auscultation bilaterally Heart: regular rate and rhythm, S1, S2 normal, no murmur, click, rub or gallop Abdomen: soft, non-tender; bowel sounds normal; no masses,  no organomegaly Pulses: 2+ and symmetric Skin: Skin color, texture, turgor normal. No rashes or lesions Lymph  nodes: Cervical, supraclavicular, and axillary nodes normal.  Assessment and Plan:  S/P bilateral cataract extraction Done sequentially.  Good results.   Screening for colon cancer He has deferred colonoscopy annually,  Hi has been given fecal occult blood tests on numerous occasions but has yet to return them.   Dyslipidemia with elevated low density lipoprotein cholesterol and abnormally low high density lipoprotein cholesterol He has been offered Niaspan for low HDL but i snot interested in taking more medication.  Obesity (BMI 30-39.9) I have addressed  BMI and recommended a low glycemic index diet utilizing smaller more frequent meals to increase metabolism.  I have also recommended that patient increase  His   exercising with a goal of 30 minutes of aerobic exercise to 5 days per week.    Hypertension Well controlled on current regimen. Renal function stable, no changes today.   Updated Medication List Outpatient Encounter Prescriptions as of 07/04/2012  Medication Sig Dispense Refill  . aspirin 81 MG tablet Take 81 mg by mouth daily.        . citalopram (CELEXA) 20 MG tablet Take 1 tablet (20 mg total) by mouth daily.  90 tablet  3  . furosemide (LASIX) 20 MG tablet Take 1 tablet (20 mg total) by mouth daily.  90 tablet  3  . losartan (COZAAR) 50 MG tablet Take 1 tablet (50 mg total) by mouth daily.  90 tablet  3  . DISCONTD: citalopram (CELEXA) 20 MG tablet Take 1 tablet (20 mg total) by mouth daily.  30 tablet  3  . DISCONTD: furosemide (LASIX) 20 MG tablet Take 1 tablet (20 mg total) by mouth daily.  30 tablet  3  . DISCONTD: losartan (COZAAR) 50 MG tablet Take 1 tablet (50 mg total) by mouth daily.  90 tablet  3  . benzonatate (TESSALON) 200 MG capsule Take 1 capsule (200 mg total) by mouth 3 (three) times daily as needed for cough.  60 capsule  1  . zoster vaccine live, PF, (ZOSTAVAX) 29562 UNT/0.65ML injection Inject 19,400 Units into the skin once.  1 vial  0     Orders Placed This Encounter  Procedures  . Flu vaccine greater than or equal to 3yo preservative free IM  . Basic metabolic panel    No Follow-up on file.

## 2012-07-04 NOTE — Telephone Encounter (Signed)
Received IFOB back from pt, please enter order so lab can process .

## 2012-07-06 ENCOUNTER — Encounter: Payer: Self-pay | Admitting: Internal Medicine

## 2012-07-06 DIAGNOSIS — I10 Essential (primary) hypertension: Secondary | ICD-10-CM | POA: Insufficient documentation

## 2012-07-06 NOTE — Assessment & Plan Note (Signed)
Well controlled on current regimen. Renal function stable, no changes today. 

## 2012-07-06 NOTE — Assessment & Plan Note (Signed)
He has deferred colonoscopy annually,  Hi has been given fecal occult blood tests on numerous occasions but has yet to return them.

## 2012-07-06 NOTE — Assessment & Plan Note (Signed)
I have addressed  BMI and recommended a low glycemic index diet utilizing smaller more frequent meals to increase metabolism.  I have also recommended that patient increase  His  exercising with a goal of 30 minutes of aerobic exercise to 5 days per week.

## 2012-07-06 NOTE — Assessment & Plan Note (Signed)
He has been offered Niaspan for low HDL but i snot interested in taking more medication.

## 2012-07-06 NOTE — Assessment & Plan Note (Signed)
Done sequentially.  Good results.

## 2012-07-09 ENCOUNTER — Other Ambulatory Visit: Payer: Medicare Other

## 2012-07-19 ENCOUNTER — Telehealth: Payer: Self-pay | Admitting: Internal Medicine

## 2012-07-19 ENCOUNTER — Ambulatory Visit: Payer: Self-pay | Admitting: Family Medicine

## 2012-07-19 DIAGNOSIS — S60459A Superficial foreign body of unspecified finger, initial encounter: Secondary | ICD-10-CM | POA: Diagnosis not present

## 2012-07-19 MED ORDER — ZOSTER VACCINE LIVE 19400 UNT/0.65ML ~~LOC~~ SOLR: 0.6500 mL | Freq: Once | SUBCUTANEOUS | Status: DC

## 2012-07-19 NOTE — Telephone Encounter (Signed)
Mr portal thought dr Darrick Huntsman gave him a rx for shingles shot He cannot find this  Can you send the rx to [facility] in [place]   [phone]

## 2012-07-19 NOTE — Telephone Encounter (Signed)
Shingles vaccine sent electronic to [facility].

## 2012-08-15 DIAGNOSIS — H7290 Unspecified perforation of tympanic membrane, unspecified ear: Secondary | ICD-10-CM | POA: Diagnosis not present

## 2012-08-15 DIAGNOSIS — H26499 Other secondary cataract, unspecified eye: Secondary | ICD-10-CM | POA: Diagnosis not present

## 2012-08-15 DIAGNOSIS — H66009 Acute suppurative otitis media without spontaneous rupture of ear drum, unspecified ear: Secondary | ICD-10-CM | POA: Diagnosis not present

## 2012-09-07 ENCOUNTER — Encounter: Payer: Self-pay | Admitting: Adult Health

## 2012-09-07 ENCOUNTER — Ambulatory Visit (INDEPENDENT_AMBULATORY_CARE_PROVIDER_SITE_OTHER): Payer: Medicare Other | Admitting: Adult Health

## 2012-09-07 ENCOUNTER — Telehealth: Payer: Self-pay | Admitting: Internal Medicine

## 2012-09-07 VITALS — BP 141/80 | HR 76 | Temp 97.7°F | Ht 67.0 in | Wt 206.0 lb

## 2012-09-07 DIAGNOSIS — J329 Chronic sinusitis, unspecified: Secondary | ICD-10-CM | POA: Insufficient documentation

## 2012-09-07 MED ORDER — FLUTICASONE PROPIONATE 50 MCG/ACT NA SUSP: NASAL | Status: DC

## 2012-09-07 MED ORDER — AMOXICILLIN-POT CLAVULANATE 875-125 MG PO TABS: 1.0000 | ORAL_TABLET | Freq: Two times a day (BID) | ORAL | Status: DC

## 2012-09-07 NOTE — Telephone Encounter (Signed)
Patient Information:  Caller Name: Dereck  Phone: [phone]  Patient: Austin, Duran  Gender: Male  DOB: April 21, 1946  Age: 67 Years  PCP: Duncan Dull (Adults only)  Office Follow Up:  Does the office need to follow up with this patient?: Yes  Instructions For The Office: Patient triages to be seen today or tomrrow . Office appt booked. Advised home care and instrucitons. Please contact patient for appt/ Work in  Charity fundraiser Note:  Patient requesting to be seen before weekend.  Home care instructions and guidelines reviewed.  Symptoms  Reason For Call & Symptoms: Patient states he has been feeling bad since 08/31/12 .  Yesterday, 09/07/11, onset of cough productive Manson Passey to clear, voice hoarse and scratchy , +runny nose clear,  Reviewed Health History In EMR: Yes  Reviewed Medications In EMR: Yes  Reviewed Allergies In EMR: Yes  Reviewed Surgeries / Procedures: No  Date of Onset of Symptoms: 08/31/2012  Treatments Tried: tylenol cough/cold  Treatments Tried Worked: No  Guideline(s) Used:  Cough  Disposition Per Guideline:   See Today or Tomorrow in Office  Reason For Disposition Reached:   Continuous (nonstop) coughing interferes with work or school and no improvement using cough treatment per Care Advice  Advice Given:  Reassurance  Coughing is the way that our lungs remove irritants and mucus. It helps protect our lungs from getting pneumonia.  You can get a dry hacking cough after a chest cold. Sometimes this type of cough can last 1-3 weeks, and be worse at night.  You can also get a cough after being exposed to irritating substances like smoke, strong perfumes, and dust.  Here is some care advice that should help.  OTC Cough Syrup - Dextromethorphan:  Read the package instructions for dosage, contraindications, and other important information.  Cough Medicines:  OTC Cough Drops: Cough drops can help a lot, especially for mild coughs. They reduce coughing by soothing your  irritated throat and removing that tickle sensation in the back of the throat. Cough drops also have the advantage of portability - you can carry them with you.  Home Remedy - Hard Candy: Hard candy works just as well as medicine-flavored OTC cough drops. Diabetics should use sugar-free candy.  Home Remedy - Honey: This old home remedy has been shown to help decrease coughing at night. The adult dosage is 2 teaspoons (10 ml) at bedtime. Honey should not be given to infants under one year of age.  Coughing Spasms:  Drink warm fluids. Inhale warm mist (Reason: both relax the airway and loosen up the phlegm).  Suck on cough drops or hard candy to coat the irritated throat.  Prevent Dehydration:  Drink adequate liquids.  This will help soothe an irritated or dry throat and loosen up the phlegm.  Avoid Tobacco Smoke:  Smoking or being exposed to smoke makes coughs much worse.  Call Back If:  Difficulty breathing  Cough lasts more than 3 weeks  Fever lasts > 3 days  You become worse.

## 2012-09-07 NOTE — Telephone Encounter (Signed)
Appointment 1/3 @ 3:30 with Austin Duran  Pt aware of appointment

## 2012-09-07 NOTE — Patient Instructions (Addendum)
  You have a sinus infection.  You can take tessalon capsules for your cough.  I have given you a prescription for Augmentin. Fill this only if you develop a fever or if your secretions change to a yellow-green color.  Please call if your symptoms do not improve within 3-4 days.

## 2012-09-07 NOTE — Telephone Encounter (Signed)
Please try the other providers in the office for appt today

## 2012-09-07 NOTE — Progress Notes (Signed)
  Subjective:    Patient ID: Austin Duran, male    DOB: 12-10-1945, 67 y.o.   MRN: [id number]  HPI  Patient is a 67 y/o male who presents to clinic today with c/o runny nose, sore throat, coughing which has been waking him up multiple times during the night. His symptoms started about 10 days ago. He is now c/o frontal HA and believes this may be from sinus pressure. Pt has tried tylenol cold and sinus with temporary relief of symptoms. He denies fever, chills, wheezing.   Current Outpatient Prescriptions on File Prior to Visit  Medication Sig Dispense Refill  . aspirin 81 MG tablet Take 81 mg by mouth daily.        . citalopram (CELEXA) 20 MG tablet Take 1 tablet (20 mg total) by mouth daily.  90 tablet  3  . losartan (COZAAR) 50 MG tablet Take 1 tablet (50 mg total) by mouth daily.  90 tablet  3  . zoster vaccine live, PF, (ZOSTAVAX) 62952 UNT/0.65ML injection Inject 19,400 Units into the skin once.  1 vial  0  . benzonatate (TESSALON) 200 MG capsule Take 1 capsule (200 mg total) by mouth 3 (three) times daily as needed for cough.  60 capsule  1  . fluticasone (FLONASE) 50 MCG/ACT nasal spray 2 sprays in each nostril daily  16 g  6  . furosemide (LASIX) 20 MG tablet Take 1 tablet (20 mg total) by mouth daily.  90 tablet  3     Review of Systems  Constitutional: Negative for fever, chills, activity change and appetite change.  HENT: Positive for congestion, sore throat, rhinorrhea, postnasal drip and sinus pressure. Negative for ear pain, neck pain and ear discharge.   Eyes: Negative.   Respiratory: Positive for cough. Negative for chest tightness, shortness of breath and wheezing.   Cardiovascular: Negative for chest pain.  Gastrointestinal: Negative.   Genitourinary: Negative.   Skin: Negative for rash.  Neurological: Negative.   Psychiatric/Behavioral: Negative.          Objective:   Physical Exam  Constitutional: He is oriented to person, place, and time. He appears  well-developed and well-nourished. No distress.  HENT:  Head: Normocephalic and atraumatic.  Right Ear: External ear normal.  Left Ear: External ear normal.  Mouth/Throat: No oropharyngeal exudate.       Pharyngeal erythema  Eyes: Conjunctivae normal are normal. Right eye exhibits no discharge. Left eye exhibits no discharge.  Neck: Normal range of motion.  Cardiovascular: Normal rate, regular rhythm and normal heart sounds.  Exam reveals no gallop.   No murmur heard. Pulmonary/Chest: Effort normal and breath sounds normal. No respiratory distress. He has no wheezes. He has no rales.  Abdominal: Soft.  Lymphadenopathy:    He has no cervical adenopathy.  Neurological: He is alert and oriented to person, place, and time.  Skin: Skin is warm and dry. No rash noted.  Psychiatric: He has a normal mood and affect. His behavior is normal. Judgment and thought content normal.          Assessment & Plan:

## 2012-09-07 NOTE — Assessment & Plan Note (Signed)
Suspect this is viral. Start flonase nasal spray. Tessalon capsules for cough. Provided patient with prescription for Augmentin but instructed not to fill unless he develops a fever or his secretions change color to yellow-green. RTC if no resolution of symptoms.

## 2012-12-27 ENCOUNTER — Encounter: Payer: Self-pay | Admitting: Internal Medicine

## 2012-12-27 ENCOUNTER — Ambulatory Visit (INDEPENDENT_AMBULATORY_CARE_PROVIDER_SITE_OTHER): Payer: Medicare Other | Admitting: Internal Medicine

## 2012-12-27 VITALS — BP 122/62 | HR 79 | Temp 97.8°F | Resp 14 | Wt 210.8 lb

## 2012-12-27 DIAGNOSIS — E876 Hypokalemia: Secondary | ICD-10-CM

## 2012-12-27 DIAGNOSIS — R5381 Other malaise: Secondary | ICD-10-CM

## 2012-12-27 DIAGNOSIS — Z125 Encounter for screening for malignant neoplasm of prostate: Secondary | ICD-10-CM | POA: Diagnosis not present

## 2012-12-27 DIAGNOSIS — I10 Essential (primary) hypertension: Secondary | ICD-10-CM

## 2012-12-27 DIAGNOSIS — Z79899 Other long term (current) drug therapy: Secondary | ICD-10-CM | POA: Diagnosis not present

## 2012-12-27 DIAGNOSIS — R5383 Other fatigue: Secondary | ICD-10-CM | POA: Diagnosis not present

## 2012-12-27 DIAGNOSIS — E785 Hyperlipidemia, unspecified: Secondary | ICD-10-CM | POA: Diagnosis not present

## 2012-12-27 DIAGNOSIS — E669 Obesity, unspecified: Secondary | ICD-10-CM

## 2012-12-27 LAB — COMPREHENSIVE METABOLIC PANEL
BUN: 16 mg/dL (ref 6–23)
CO2: 30 mEq/L (ref 19–32)
GFR: 68.23 mL/min (ref >60.00)
Glucose, Bld: 133 mg/dL — ABNORMAL HIGH (ref 70–99)
Sodium: 136 mEq/L (ref 135–145)
Total Bilirubin: 1.4 mg/dL — ABNORMAL HIGH (ref 0.3–1.2)
Total Protein: 6.8 g/dL (ref 6.0–8.3)

## 2012-12-27 MED ORDER — POTASSIUM CHLORIDE CRYS ER 10 MEQ PO TBCR: 20.0000 meq | EXTENDED_RELEASE_TABLET | Freq: Every day | ORAL | Status: DC

## 2012-12-27 NOTE — Patient Instructions (Signed)
Return in 6 months for your Wellness exam

## 2012-12-27 NOTE — Assessment & Plan Note (Signed)
Diuretic induced.

## 2012-12-27 NOTE — Progress Notes (Signed)
Patient ID: Austin Duran, male   DOB: Dec 26, 1945, 67 y.o.   MRN: [id number]  Patient Active Problem List  Diagnosis  . Screening for colon cancer  . Screening for prostate cancer  . Dyslipidemia with elevated low density lipoprotein cholesterol and abnormally low high density lipoprotein cholesterol  . Cough  . Lumbago  . Basal cell carcinoma of back  . Hearing loss, sensorineural, asymmetrical  . S/P bilateral cataract extraction  . Encounter for Medicare annual wellness exam  . Obesity (BMI 30-39.9)  . Hypertension  . Hypokalemia    Subjective:  CC:   Chief Complaint  Patient presents with  . Follow-up    HPI:   Austin Duran a 67 y.o. male who presents 6 month follow up on hypertension and hyperlipidemia .  He was treated for sinusitis several weeks ago but he  developed nausea and vomiting from the Augmentin and stopped it after one day.  His symptoms resolved without antibiotics .  He feels fine .  Tolerating medications without muscle cramps    Sleeping well, no complaints. Has not lost any weight since the fall but is walking several  times a week. .  Underwent cataract surgery last fall with no complications.    Past Medical History  Diagnosis Date  . Hearing loss     had a perforated right TM,   followed by Dr. Jenne Campus  . Panic attack   . Hypertension   . Depression   . Panic attacks     Past Surgical History  Procedure Laterality Date  . Tympanic membrane repair      [facility] , right   . Fracture surgery    . Cataract extraction, bilateral  May 2013    Porfilio  . Eye surgery  2008    eyelid surgery, Dr. Kemper Durie  . Cataract extraction Bilateral Aug 2013    Porfilio       The following portions of the patient's history were reviewed and updated as appropriate: Allergies, current medications, and problem list.    Review of Systems:   Patient denies headache, fevers, malaise, unintentional weight loss, skin rash, eye pain, sinus congestion and  sinus pain, sore throat, dysphagia,  hemoptysis , cough, dyspnea, wheezing, chest pain, palpitations, orthopnea, edema, abdominal pain, nausea, melena, diarrhea, constipation, flank pain, dysuria, hematuria, urinary  Frequency, nocturia, numbness, tingling, seizures,  Focal weakness, Loss of consciousness,  Tremor, insomnia, depression, anxiety, and suicidal ideation.      History   Social History  . Marital Status: Married    Spouse Name: N/A    Number of Children: N/A  . Years of Education: N/A   Occupational History  . Not on file.   Social History Main Topics  . Smoking status: Former Smoker    Quit date: 08/03/2004  . Smokeless tobacco: Never Used  . Alcohol Use: 2.5 oz/week    5 drink(s) per week  . Drug Use: No  . Sexually Active: Not on file   Other Topics Concern  . Not on file   Social History Narrative  . No narrative on file    Objective:  BP 122/62  Pulse 79  Temp(Src) 97.8 F (36.6 C) (Oral)  Resp 14  Wt 210 lb 12 oz (95.596 kg)  BMI 33 kg/m2  SpO2 97%  General appearance: alert, cooperative and appears stated age Ears: normal TM's and external ear canals both ears Throat: lips, mucosa, and tongue normal; teeth and gums normal Neck: no adenopathy, no carotid  bruit, supple, symmetrical, trachea midline and thyroid not enlarged, symmetric, no tenderness/mass/nodules Back: symmetric, no curvature. ROM normal. No CVA tenderness. Lungs: clear to auscultation bilaterally Heart: regular rate and rhythm, S1, S2 normal, no murmur, click, rub or gallop Abdomen: soft, non-tender; bowel sounds normal; no masses,  no organomegaly Pulses: 2+ and symmetric Skin: Skin color, texture, turgor normal. No rashes or lesions Lymph nodes: Cervical, supraclavicular, and axillary nodes normal.  Assessment and Plan:  Hypokalemia Diuretic induced.   Hypertension Well controlled on current regimen. Renal function stable,  Potassium low.  no changes today.  Obesity (BMI  30-39.9) I have addressed  BMI and recommended a low glycemic index diet utilizing smaller more frequent meals to increase metabolism.  I have also recommended that patient start exercising with a goal of 30 minutes of aerobic exercise a minimum of 5 days per week.   Dyslipidemia with elevated low density lipoprotein cholesterol and abnormally low high density lipoprotein cholesterol LDL and triglycerides are at goal on current medications. He has no side effects and liver enzymes are normal. No changes today    Updated Medication List Outpatient Encounter Prescriptions as of 12/27/2012  Medication Sig Dispense Refill  . aspirin 81 MG tablet Take 81 mg by mouth daily.        . citalopram (CELEXA) 20 MG tablet Take 1 tablet (20 mg total) by mouth daily.  90 tablet  3  . furosemide (LASIX) 20 MG tablet Take 1 tablet (20 mg total) by mouth daily.  90 tablet  3  . losartan (COZAAR) 50 MG tablet Take 1 tablet (50 mg total) by mouth daily.  90 tablet  3  . potassium chloride (K-DUR,KLOR-CON) 10 MEQ tablet Take 2 tablets (20 mEq total) by mouth daily.  10 tablet  1  . [DISCONTINUED] amoxicillin-clavulanate (AUGMENTIN) 875-125 MG per tablet Take 1 tablet by mouth 2 (two) times daily.  20 tablet  0  . [DISCONTINUED] benzonatate (TESSALON) 200 MG capsule Take 1 capsule (200 mg total) by mouth 3 (three) times daily as needed for cough.  60 capsule  1  . [DISCONTINUED] fluticasone (FLONASE) 50 MCG/ACT nasal spray 2 sprays in each nostril daily  16 g  6  . [DISCONTINUED] zoster vaccine live, PF, (ZOSTAVAX) 16109 UNT/0.65ML injection Inject 19,400 Units into the skin once.  1 vial  0   No facility-administered encounter medications on file as of 12/27/2012.     Orders Placed This Encounter  Procedures  . Comprehensive metabolic panel  . Lipid panel  . CBC with Differential  . TSH  . Comprehensive metabolic panel  . PSA, Medicare    Return in about 6 months (around 06/28/2013).

## 2012-12-27 NOTE — Assessment & Plan Note (Signed)
LDL and triglycerides are at goal on current medications. He has no side effects and liver enzymes are normal. No changes today  

## 2012-12-27 NOTE — Assessment & Plan Note (Signed)
Well controlled on current regimen. Renal function stable,  Potassium low.  no changes today.

## 2012-12-27 NOTE — Assessment & Plan Note (Signed)
I have addressed  BMI and recommended a low glycemic index diet utilizing smaller more frequent meals to increase metabolism.  I have also recommended that patient start exercising with a goal of 30 minutes of aerobic exercise a minimum of 5 days per week.  

## 2013-01-02 ENCOUNTER — Ambulatory Visit: Payer: Medicare Other | Admitting: Internal Medicine

## 2013-03-21 DIAGNOSIS — D239 Other benign neoplasm of skin, unspecified: Secondary | ICD-10-CM | POA: Diagnosis not present

## 2013-03-21 DIAGNOSIS — D485 Neoplasm of uncertain behavior of skin: Secondary | ICD-10-CM | POA: Diagnosis not present

## 2013-03-21 DIAGNOSIS — L570 Actinic keratosis: Secondary | ICD-10-CM | POA: Diagnosis not present

## 2013-03-21 DIAGNOSIS — L821 Other seborrheic keratosis: Secondary | ICD-10-CM | POA: Diagnosis not present

## 2013-07-02 ENCOUNTER — Encounter: Payer: Self-pay | Admitting: Internal Medicine

## 2013-07-02 ENCOUNTER — Ambulatory Visit (INDEPENDENT_AMBULATORY_CARE_PROVIDER_SITE_OTHER): Payer: Medicare Other | Admitting: Internal Medicine

## 2013-07-02 VITALS — BP 130/80 | HR 78 | Temp 98.3°F | Resp 12 | Ht 68.0 in | Wt 213.8 lb

## 2013-07-02 DIAGNOSIS — E669 Obesity, unspecified: Secondary | ICD-10-CM

## 2013-07-02 DIAGNOSIS — G609 Hereditary and idiopathic neuropathy, unspecified: Secondary | ICD-10-CM | POA: Diagnosis not present

## 2013-07-02 DIAGNOSIS — Z0000 Encounter for general adult medical examination without abnormal findings: Secondary | ICD-10-CM | POA: Diagnosis not present

## 2013-07-02 DIAGNOSIS — Z79899 Other long term (current) drug therapy: Secondary | ICD-10-CM | POA: Diagnosis not present

## 2013-07-02 DIAGNOSIS — I10 Essential (primary) hypertension: Secondary | ICD-10-CM

## 2013-07-02 DIAGNOSIS — C44519 Basal cell carcinoma of skin of other part of trunk: Secondary | ICD-10-CM | POA: Diagnosis not present

## 2013-07-02 DIAGNOSIS — R5381 Other malaise: Secondary | ICD-10-CM

## 2013-07-02 DIAGNOSIS — Z1211 Encounter for screening for malignant neoplasm of colon: Secondary | ICD-10-CM

## 2013-07-02 DIAGNOSIS — E785 Hyperlipidemia, unspecified: Secondary | ICD-10-CM | POA: Diagnosis not present

## 2013-07-02 DIAGNOSIS — Z125 Encounter for screening for malignant neoplasm of prostate: Secondary | ICD-10-CM

## 2013-07-02 DIAGNOSIS — Z23 Encounter for immunization: Secondary | ICD-10-CM | POA: Diagnosis not present

## 2013-07-02 DIAGNOSIS — G5693 Unspecified mononeuropathy of bilateral upper limbs: Secondary | ICD-10-CM

## 2013-07-02 DIAGNOSIS — E559 Vitamin D deficiency, unspecified: Secondary | ICD-10-CM | POA: Diagnosis not present

## 2013-07-02 DIAGNOSIS — N400 Enlarged prostate without lower urinary tract symptoms: Secondary | ICD-10-CM | POA: Insufficient documentation

## 2013-07-02 DIAGNOSIS — R002 Palpitations: Secondary | ICD-10-CM | POA: Diagnosis not present

## 2013-07-02 LAB — CBC WITH DIFFERENTIAL/PLATELET
Basophils Absolute: 0 10*3/uL (ref 0.0–0.1)
Eosinophils Relative: 1.7 % (ref 0.0–5.0)
HCT: 44.1 % (ref 39.0–52.0)
Hemoglobin: 14.9 g/dL (ref 13.0–17.0)
Lymphocytes Relative: 21.7 % (ref 12.0–46.0)
Lymphs Abs: 1.3 10*3/uL (ref 0.7–4.0)
MCV: 90.6 fl (ref 78.0–100.0)
Monocytes Relative: 11.6 % (ref 3.0–12.0)
Neutro Abs: 4 10*3/uL (ref 1.4–7.7)
Neutrophils Relative %: 64.6 % (ref 43.0–77.0)
Platelets: 245 10*3/uL (ref 150.0–400.0)
RDW: 12.6 % (ref 11.5–14.6)
WBC: 6.1 10*3/uL (ref 4.5–10.5)

## 2013-07-02 LAB — LIPID PANEL
Cholesterol: 144 mg/dL (ref 0–200)
LDL Cholesterol: 93 mg/dL (ref 0–99)
Total CHOL/HDL Ratio: 5
VLDL: 20.6 mg/dL (ref 0.0–40.0)

## 2013-07-02 LAB — COMPREHENSIVE METABOLIC PANEL
ALT: 18 U/L (ref 0–53)
AST: 19 U/L (ref 0–37)
Albumin: 4.1 g/dL (ref 3.5–5.2)
CO2: 25 mEq/L (ref 19–32)
Calcium: 9.2 mg/dL (ref 8.4–10.5)
Chloride: 105 mEq/L (ref 96–112)
Creatinine, Ser: 1 mg/dL (ref 0.4–1.5)
GFR: 79.24 mL/min (ref >60.00)
Potassium: 4.1 mEq/L (ref 3.5–5.1)
Total Bilirubin: 1.4 mg/dL — ABNORMAL HIGH (ref 0.3–1.2)

## 2013-07-02 LAB — TSH: TSH: 1.28 u[IU]/mL (ref 0.35–5.50)

## 2013-07-02 LAB — MAGNESIUM: Magnesium: 2.1 mg/dL (ref 1.5–2.5)

## 2013-07-02 NOTE — Patient Instructions (Signed)
You had your annual Medicare wellness exam today  Please use the stool kit to send [place] back a sample to test for blood.  This is your colon CA screening test.   You received the pneumonia and the flu vaccines today.   Three issues identified on your exam today:  1) enlarged prostate 2) Weight gain.  Your BMI is now 32 3) Recurrent episodes of arm weakness and numbness of uncertain etiology  We will contact you with the bloodwork results    I want you to lose 18 lbs over the next 6 monhths (goal 195 lbs), to get your BMI < 30. You can do this easily if you combine daily exercise with a low glycemic index diet (a diet that reduces the starches in your diet to high fiber substitutes.    This is  One version of a  "Low GI"  Diet:  It will still lower your blood sugars and allow you to lose 4 to 8  lbs  per month if you follow it carefully.  Your goal with exercise is a minimum of 30 minutes of aerobic exercise 5 days per week (Walking does not count once it becomes easy!)    All of the foods can be found at grocery stores and in bulk at [employer].  The Atkins protein bars and shakes are available in more varieties at Target, WalMart and Lowe's Foods.     7 AM Breakfast:  Choose from the following:  Low carbohydrate Protein  Shakes (I recommend the EAS AdvantEdge "Carb Control" shakes  Or the low carb shakes by Atkins.    2.5 carbs   Arnold's "Sandwhich Thin"toasted  w/ peanut butter (no jelly: about 20 net carbs  "Bagel Thin" with cream cheese and salmon: about 20 carbs   a scrambled egg/bacon/cheese burrito made with Mission's "carb balance" whole wheat tortilla  (about 10 net carbs )   Avoid cereal and bananas, oatmeal and cream of wheat and grits. They are loaded with carbohydrates!   10 AM: high protein snack  Protein bar by Atkins (the snack size, under 200 cal, usually < 6 net carbs).    A stick of cheese:  Around 1 carb,  100 cal     Dannon Light n Fit [place] Yogurt  (80 cal, 8  carbs)  Other so called "protein bars" and Greek yogurts tend to be loaded with carbohydrates.  Remember, in food advertising, the word "energy" is synonymous for " carbohydrate."  Lunch:   A Sandwich using the bread choices listed, Can use any  Eggs,  lunchmeat, grilled meat or canned tuna), avocado, regular mayo/mustard  and cheese.  A Salad using blue cheese, ranch,  Goddess or vinagrette,  No croutons or "confetti" and no "candied nuts" but regular nuts OK.   No pretzels or chips.  Pickles and miniature sweet peppers are a good low carb alternative that provide a "crunch"  The bread is the only source of carbohydrate in a sandwich and  can be decreased by trying some of these alternatives to traditional loaf bread  Joseph's makes a pita bread and a flat bread that are 50 cal and 4 net carbs available at BJs and WalMart.  This can be toasted to use with hummous as well  Toufayan makes a low carb flatbread that's 100 cal and 9 net carbs available at [employer] and [employer] makes 2 sizes of  Low carb whole wheat tortilla  (The large one is  210 cal and 6 net carbs) Avoid "Low fat dressings, as well as Reyne Dumas and [address] dressings They are loaded with sugar!   3 PM/ Mid day  Snack:  Consider  1 ounce of  almonds, walnuts, pistachios, pecans, peanuts,  Macadamia nuts or a nut medley.  Avoid "granola"; the dried cranberries and raisins are loaded with carbohydrates. Mixed nuts as long as there are no raisins,  cranberries or dried fruit.     6 PM  Dinner:     Meat/fowl/fish with a green salad, and either broccoli, cauliflower, green beans, spinach, brussel sprouts or  Lima beans. DO NOT BREAD THE PROTEIN!!      There is a low carb pasta by Dreamfield's that is acceptable and tastes great: only 5 digestible carbs/serving.( All grocery stores but BJs carry it )  Try Kai Levins Angelo's chicken piccata or chicken or eggplant parm over low carb pasta.(Lowes and BJs)   Clifton Custard Sanchez's  "Carnitas" (pulled pork, no sauce,  0 carbs) or his beef pot roast to make a dinner burrito (at BJ's)  Pesto over low carb pasta (bj's sells a good quality pesto in the center refrigerated section of the deli   Whole wheat pasta is still full of digestible carbs and  Not as low in glycemic index as Dreamfield's.   Brown rice is still rice,  So skip the rice and noodles if you eat [place] or [place] (or at least limit to 1/2 cup)  9 PM snack :   Breyer's "low carb" fudgsicle or  ice cream bar (Carb Smart line), or  Weight Watcher's ice cream bar , or another "no sugar added" ice cream;  a serving of fresh berries/cherries with whipped cream   Cheese or DANNON'S LlGHT N FIT [place]REEK YOGURT  Avoid bananas, pineapple, grapes  and watermelon on a regular basis because they are high in sugar.  THINK OF THEM AS DESSERT  Remember that snack Substitutions should be less than 10 NET carbs per serving and meals < 20 carbs. Remember to subtract fiber grams to get the "net carbs."

## 2013-07-02 NOTE — Progress Notes (Signed)
Patient ID: Austin Duran, male   DOB: [birth year], 67 y.o.   MRN: [id number]   The patient is here for annual Medicare wellness examination and management of other chronic and acute problems.   1 month history of Recurrent episodes of bilateral arm weakness ,severe,  with aching and distal tingling. The episodes occur spontaneously and last several minutes before resolving spontaneously .  He has had 5 discreet episodes,  1st one occurred while working in the yard ,  2ndoccurred  just walking across the room. None associated with heavy lifting .  No chronic neck pain.  No diaphoresis,  Jaw pain, chest pain or nausea    The risk factors are reflected in the social history.  The roster of all physicians providing medical care to patient - is listed in the Snapshot section of the chart.  Activities of daily living:  The patient is 100% independent in all ADLs: dressing, toileting, feeding as well as independent mobility  Home safety : The patient has smoke detectors in the home. They wear seatbelts.  There are no firearms at home. There is no violence in the home.   There is no risks for hepatitis, STDs or HIV. There is no   history of blood transfusion. They have no travel history to infectious disease endemic areas of the world.  The patient has seen their dentist in the last six month. They have seen their eye doctor in the last year. They admit to slight hearing difficulty with regard to whispered voices and some television programs.  They have deferred audiologic testing in the last year.  They do not  have excessive sun exposure. Discussed the need for sun protection: hats, long sleeves and use of sunscreen if there is significant sun exposure.   Diet: the importance of a healthy diet is discussed. They do have a healthy diet.  The benefits of regular aerobic exercise were discussed. She walks 4 times per week ,  20 minutes.   Depression screen: there are no signs or vegative symptoms of  depression- irritability, change in appetite, anhedonia, sadness/tearfullness.  Cognitive assessment: the patient manages all their financial and personal affairs and is actively engaged. They could relate day,date,year and events; recalled 2/3 objects at 3 minutes; performed clock-face test normally.  The following portions of the patient's history were reviewed and updated as appropriate: allergies, current medications, past family history, past medical history,  past surgical history, past social history  and problem list.  Visual acuity was not assessed per patient preference since she has regular follow up with her ophthalmologist. Hearing and body mass index were assessed and reviewed.   During the course of the visit the patient was educated and counseled about appropriate screening and preventive services including : fall prevention , diabetes screening, nutrition counseling, colorectal cancer screening, and recommended immunizations.    Objective:  BP 130/80  Pulse 78  Temp(Src) 98.3 F (36.8 C) (Oral)  Resp 12  Ht 5\' 8"  (1.727 m)  Wt 213 lb 12 oz (96.956 kg)  BMI 32.51 kg/m2  SpO2 97%  General Appearance:    Alert, cooperative, no distress, appears stated age  Head:    Normocephalic, without obvious abnormality, atraumatic  Eyes:    PERRL, conjunctiva/corneas clear, EOM's intact, fundi    benign, both eyes       Ears:    Normal TM's and external ear canals, both ears  Nose:   Nares normal, septum midline, mucosa normal, no drainage  or sinus tenderness  Throat:   Lips, mucosa, and tongue normal; teeth and gums normal  Neck:   Supple, symmetrical, trachea midline, no adenopathy;       thyroid:  No enlargement/tenderness/nodules; no carotid   bruit or JVD  Back:     Symmetric, no curvature, ROM normal, no CVA tenderness  Lungs:     Clear to auscultation bilaterally, respirations unlabored  Chest wall:    No tenderness or deformity  Heart:    Regular rate and rhythm, S1  and S2 normal, no murmur, rub   or gallop  Abdomen:     Soft, non-tender, bowel sounds active all four quadrants,    no masses, no organomegaly  Genitalia:    Normal male without lesion, discharge or tenderness  Rectal:    Normal tone, right side of prostate enlarged,  nontender  Extremities:   Extremities normal, atraumatic, no cyanosis or edema  Pulses:   2+ and symmetric all extremities  Skin:   Skin color, texture, turgor normal, no rashes or lesions  Lymph nodes:   Cervical, supraclavicular, and axillary nodes normal  Neurologic:   CNII-XII intact. Normal strength, sensation and reflexes      throughout   Assessment and Plan:  Obesity (BMI 30-39.9) I have addressed  BMI and recommended a low glycemic index diet utilizing smaller more frequent meals to increase metabolism.  I have also recommended that patient start exercising with a goal of 30 minutes of aerobic exercise a minimum of 5 days per week. Screening for lipid disorders, thyroid and diabetes to be done today.    Hypertension Well controlled on current regimen. Renal function due,  no changes today. Lab Results  Component Value Date   CREATININE 1.0 07/02/2013    Dyslipidemia with elevated low density lipoprotein cholesterol and abnormally low high density lipoprotein cholesterol LDL and triglycerides are at goal on current medications. He has no side effects and liver enzymes are normal. No changes today  Lab Results  Component Value Date   CHOL 144 07/02/2013   HDL 30.90* 07/02/2013   LDLCALC 93 07/02/2013   TRIG 103.0 07/02/2013   CHOLHDL 5 07/02/2013       Encounter for Medicare annual wellness exam Annual male exam was done including testicular and prostate exam, which was abnormal. PSA is pending .  Colon ca screening was reviewed and options given.    Enlarged prostate without lower urinary tract symptoms (luts) Right lobe , on exam.  If PSA is low , no referral since he is asymptomatic.    Screening for colon cancer He has continaully deferred colonoscopy ,  continue annual FOBTs  Bilateral neuropathy of upper extremities With a normal nueuomuscular exam today etiology   sunclear.  EKG is normal,  screening labs,  Consider MRI cervical spine if labs are normal vs cardiac evaluation   Updated Medication List Outpatient Encounter Prescriptions as of 07/02/2013  Medication Sig Dispense Refill  . aspirin 81 MG tablet Take 81 mg by mouth daily.        . citalopram (CELEXA) 20 MG tablet Take 1 tablet (20 mg total) by mouth daily.  90 tablet  3  . furosemide (LASIX) 20 MG tablet Take 1 tablet (20 mg total) by mouth daily.  90 tablet  3  . losartan (COZAAR) 50 MG tablet Take 1 tablet (50 mg total) by mouth daily.  90 tablet  3  . potassium chloride (K-DUR,KLOR-CON) 10 MEQ tablet Take 2 tablets (20 mEq total)  by mouth daily.  10 tablet  1   No facility-administered encounter medications on file as of 07/02/2013.

## 2013-07-02 NOTE — Assessment & Plan Note (Signed)
LDL and triglycerides are at goal on current medications. He has no side effects and liver enzymes are normal. No changes today  Lab Results  Component Value Date   CHOL 144 07/02/2013   HDL 30.90* 07/02/2013   LDLCALC 93 07/02/2013   TRIG 103.0 07/02/2013   CHOLHDL 5 07/02/2013

## 2013-07-02 NOTE — Assessment & Plan Note (Signed)
Well controlled on current regimen. Renal function due,  no changes today. Lab Results  Component Value Date   CREATININE 1.0 07/02/2013

## 2013-07-02 NOTE — Assessment & Plan Note (Signed)
Annual male exam was done including testicular and prostate exam, which was abnormal. PSA is pending .  Colon ca screening was reviewed and options given.

## 2013-07-02 NOTE — Assessment & Plan Note (Signed)
With a normal nueuomuscular exam today etiology i sunclear.  Screening labs,  Consider MRI cervical spine if labs are normal vs cardiac evaluation

## 2013-07-02 NOTE — Assessment & Plan Note (Signed)
Right lobe , on exam.  If PSA is low , no referral since he is asymptomatic.

## 2013-07-02 NOTE — Assessment & Plan Note (Signed)
He has continaully deferred colonoscopy ,  continue annual FOBTs

## 2013-07-02 NOTE — Assessment & Plan Note (Signed)
I have addressed  BMI and recommended a low glycemic index diet utilizing smaller more frequent meals to increase metabolism.  I have also recommended that patient start exercising with a goal of 30 minutes of aerobic exercise a minimum of 5 days per week. Screening for lipid disorders, thyroid and diabetes to be done today.   

## 2013-07-03 DIAGNOSIS — E559 Vitamin D deficiency, unspecified: Secondary | ICD-10-CM | POA: Insufficient documentation

## 2013-07-03 MED ORDER — ERGOCALCIFEROL 1.25 MG (50000 UT) PO CAPS: 50000.0000 [IU] | ORAL_CAPSULE | ORAL | Status: DC

## 2013-07-03 NOTE — Addendum Note (Signed)
Addended by: Sherlene Shams on: 07/03/2013 01:15 PM   Modules accepted: Orders

## 2013-07-10 ENCOUNTER — Other Ambulatory Visit: Payer: Self-pay | Admitting: *Deleted

## 2013-07-10 MED ORDER — CITALOPRAM HYDROBROMIDE 20 MG PO TABS: 20.0000 mg | ORAL_TABLET | Freq: Every day | ORAL | Status: DC

## 2013-07-10 MED ORDER — FUROSEMIDE 20 MG PO TABS: 20.0000 mg | ORAL_TABLET | Freq: Every day | ORAL | Status: DC

## 2013-07-10 NOTE — Telephone Encounter (Signed)
Eprescribed.

## 2013-08-09 ENCOUNTER — Other Ambulatory Visit: Payer: Self-pay | Admitting: *Deleted

## 2013-08-09 DIAGNOSIS — I10 Essential (primary) hypertension: Secondary | ICD-10-CM

## 2013-08-09 MED ORDER — LOSARTAN POTASSIUM 50 MG PO TABS: 50.0000 mg | ORAL_TABLET | Freq: Every day | ORAL | Status: DC

## 2013-08-22 DIAGNOSIS — H02409 Unspecified ptosis of unspecified eyelid: Secondary | ICD-10-CM | POA: Diagnosis not present

## 2014-01-08 ENCOUNTER — Other Ambulatory Visit: Payer: Self-pay | Admitting: Internal Medicine

## 2014-01-20 DIAGNOSIS — M1710 Unilateral primary osteoarthritis, unspecified knee: Secondary | ICD-10-CM | POA: Diagnosis not present

## 2014-02-20 ENCOUNTER — Encounter: Payer: Self-pay | Admitting: Internal Medicine

## 2014-02-20 ENCOUNTER — Ambulatory Visit (INDEPENDENT_AMBULATORY_CARE_PROVIDER_SITE_OTHER): Payer: Medicare Other | Admitting: Internal Medicine

## 2014-02-20 VITALS — BP 128/74 | HR 65 | Temp 97.8°F | Resp 16 | Ht 67.0 in | Wt 217.8 lb

## 2014-02-20 DIAGNOSIS — G609 Hereditary and idiopathic neuropathy, unspecified: Secondary | ICD-10-CM

## 2014-02-20 DIAGNOSIS — E538 Deficiency of other specified B group vitamins: Secondary | ICD-10-CM

## 2014-02-20 DIAGNOSIS — E876 Hypokalemia: Secondary | ICD-10-CM

## 2014-02-20 DIAGNOSIS — E559 Vitamin D deficiency, unspecified: Secondary | ICD-10-CM

## 2014-02-20 DIAGNOSIS — I10 Essential (primary) hypertension: Secondary | ICD-10-CM

## 2014-02-20 DIAGNOSIS — E785 Hyperlipidemia, unspecified: Secondary | ICD-10-CM | POA: Diagnosis not present

## 2014-02-20 DIAGNOSIS — G5693 Unspecified mononeuropathy of bilateral upper limbs: Secondary | ICD-10-CM

## 2014-02-20 DIAGNOSIS — E669 Obesity, unspecified: Secondary | ICD-10-CM

## 2014-02-20 LAB — LIPID PANEL
CHOLESTEROL: 136 mg/dL (ref 0–200)
HDL: 30.7 mg/dL — AB (ref >39.00)
LDL Cholesterol: 75 mg/dL (ref 0–99)
NonHDL: 105.3
Total CHOL/HDL Ratio: 4
Triglycerides: 150 mg/dL — ABNORMAL HIGH (ref 0.0–149.0)
VLDL: 30 mg/dL (ref 0.0–40.0)

## 2014-02-20 LAB — COMPREHENSIVE METABOLIC PANEL
ALT: 18 U/L (ref 0–53)
AST: 22 U/L (ref 0–37)
Albumin: 4 g/dL (ref 3.5–5.2)
Alkaline Phosphatase: 59 U/L (ref 39–117)
BILIRUBIN TOTAL: 1.4 mg/dL — AB (ref 0.2–1.2)
BUN: 19 mg/dL (ref 6–23)
CALCIUM: 9.3 mg/dL (ref 8.4–10.5)
CO2: 27 meq/L (ref 19–32)
CREATININE: 1.1 mg/dL (ref 0.4–1.5)
Chloride: 106 mEq/L (ref 96–112)
GFR: 67.99 mL/min (ref >60.00)
Glucose, Bld: 109 mg/dL — ABNORMAL HIGH (ref 70–99)
Potassium: 4.1 mEq/L (ref 3.5–5.1)
Sodium: 139 mEq/L (ref 135–145)
Total Protein: 6.4 g/dL (ref 6.0–8.3)

## 2014-02-20 LAB — VITAMIN B12: Vitamin B-12: 191 pg/mL — ABNORMAL LOW (ref 211–911)

## 2014-02-20 LAB — MAGNESIUM: Magnesium: 2.3 mg/dL (ref 1.5–2.5)

## 2014-02-20 LAB — TSH: TSH: 1.37 u[IU]/mL (ref 0.35–4.50)

## 2014-02-20 MED ORDER — CYANOCOBALAMIN 1000 MCG/ML IJ SOLN
1000.0000 ug | Freq: Once | INTRAMUSCULAR | Status: AC
  Administered 2014-02-20: 1000 ug via INTRAMUSCULAR

## 2014-02-20 NOTE — Assessment & Plan Note (Addendum)
Worsening, with nearly daily episodes, NOT BROUGHT on with head turn .  He has a  normal neuromuscular exam today.  etiology appears to be B12 deficiency .  Will begin b12 I injections daily x 4, then weekly x 4, the monthly.

## 2014-02-20 NOTE — Progress Notes (Signed)
Pre-visit discussion using our clinic review tool. No additional management support is needed unless otherwise documented below in the visit note.  

## 2014-02-20 NOTE — Assessment & Plan Note (Addendum)
Treated with Drisdol weekly x 3 monhts,  Now on oral daily supplements 1000 units

## 2014-02-20 NOTE — Assessment & Plan Note (Signed)
I have addressed  BMI and recommended wt loss of 10% of body weigh over the next 6 months using a low glycemic index diet and regular exercise a minimum of 5 days per week.   

## 2014-02-20 NOTE — Progress Notes (Signed)
Patient ID: Austin Duran, male   DOB: 1946/04/11, 68 y.o.   MRN: [id number]  Patient Active Problem List   Diagnosis Date Noted  . Unspecified vitamin D deficiency 07/03/2013  . Enlarged prostate without lower urinary tract symptoms (luts) 07/02/2013  . Bilateral neuropathy of upper extremities 07/02/2013  . Hypokalemia 12/27/2012  . Hypertension 07/06/2012  . Basal cell carcinoma of back 01/02/2012  . Hearing loss, sensorineural, asymmetrical 01/02/2012  . S/P bilateral cataract extraction 01/02/2012  . Encounter for Medicare annual wellness exam 01/02/2012  . Obesity (BMI 30-39.9) 01/02/2012  . Screening for colon cancer 08/07/2011  . Screening for prostate cancer 08/07/2011  . Dyslipidemia with elevated low density lipoprotein cholesterol and abnormally low high density lipoprotein cholesterol 08/07/2011  . Lumbago 08/07/2011    Subjective:  CC:   Chief Complaint  Patient presents with  . Follow-up    medication    HPI:   Austin Duran is a 68 y.o. male who presents for  For the past 6 months  Hew has been having recurrent episodes of both arms going numb from elbow to hand.  Has pain from neck to elbows that he describes as aching  And the distal part goes numb. Not brought on with head turn,  His  hands get weak and tremulous at the time .  Has been taking Vitamin D supplements as directed.    Past Medical History  Diagnosis Date  . Hearing loss     had a perforated right TM,   followed by Dr. Tami Ribas  . Panic attack   . Hypertension   . Depression   . Panic attacks     Past Surgical History  Procedure Laterality Date  . Tympanic membrane repair      [facility] , right   . Fracture surgery    . Cataract extraction, bilateral  May 2013    [facility]  . Eye surgery  2008    eyelid surgery, Dr. Loletta Specter  . Cataract extraction Bilateral Aug 2013    [facility]       The following portions of the patient's history were reviewed and updated as appropriate:  Allergies, current medications, and problem list.    Review of Systems:   Patient denies headache, fevers, malaise, unintentional weight loss, skin rash, eye pain, sinus congestion and sinus pain, sore throat, dysphagia,  hemoptysis , cough, dyspnea, wheezing, chest pain, palpitations, orthopnea, edema, abdominal pain, nausea, melena, diarrhea, constipation, flank pain, dysuria, hematuria, urinary  Frequency, nocturia, numbness, tingling, seizures,  Focal weakness, Loss of consciousness,  Tremor, insomnia, depression, anxiety, and suicidal ideation.     History   Social History  . Marital Status: Married    Spouse Name: N/A    Number of Children: N/A  . Years of Education: N/A   Occupational History  . Not on file.   Social History Main Topics  . Smoking status: Former Smoker    Quit date: 08/03/2004  . Smokeless tobacco: Never Used  . Alcohol Use: 2.5 oz/week    5 drink(s) per week  . Drug Use: No  . Sexual Activity: Not on file   Other Topics Concern  . Not on file   Social History Narrative  . No narrative on file    Objective:  Filed Vitals:   02/20/14 0812  BP: 128/74  Pulse: 65  Temp: 97.8 F (36.6 C)  Resp: 16     General appearance: alert, cooperative and appears stated age Ears: normal TM's and external  ear canals both ears Throat: lips, mucosa, and tongue normal; teeth and gums normal Neck: no adenopathy, no carotid bruit, supple, symmetrical, trachea midline and thyroid not enlarged, symmetric, no tenderness/mass/nodules Back: symmetric, no curvature. ROM normal. No CVA tenderness. Lungs: clear to auscultation bilaterally Heart: regular rate and rhythm, S1, S2 normal, no murmur, click, rub or gallop Abdomen: soft, non-tender; bowel sounds normal; no masses,  no organomegaly Pulses: 2+ and symmetric Skin: Skin color, texture, turgor normal. No rashes or lesions Lymph nodes: Cervical, supraclavicular, and axillary nodes normal.  Assessment and  Plan:  Bilateral neuropathy of upper extremities Worsening, with nearly daily episodes, NOT BROUGHT on with head turn .  He has a  normal neuromuscular exam today.  etiology appears to be B12 deficiency .  Will begin b12 I injections daily x 4, then weekly x 4, the monthly.  Unspecified vitamin D deficiency Treated with Drisdol weekly x 3 monhts,  Now on oral daily supplements 1000 units  Obesity (BMI 30-39.9) I have addressed  BMI and recommended wt loss of 10% of body weigh over the next 6 months using a low glycemic index diet and regular exercise a minimum of 5 days per week.    Hypokalemia Checking again today with mg level.    Updated Medication List Outpatient Encounter Prescriptions as of 02/20/2014  Medication Sig  . aspirin 81 MG tablet Take 81 mg by mouth daily.    . Cholecalciferol (VITAMIN D-3 PO) Take 1 tablet by mouth daily.  . citalopram (CELEXA) 20 MG tablet TAKE 1 TABLET BY MOUTH EVERY DAY  . furosemide (LASIX) 20 MG tablet TAKE 1 TABLET BY MOUTH EVERY DAY  . losartan (COZAAR) 50 MG tablet TAKE 1 TABLET BY MOUTH DAILY.  . ergocalciferol (DRISDOL) 50000 UNITS capsule Take 1 capsule (50,000 Units total) by mouth once a week. For 3 months  . potassium chloride (K-DUR,KLOR-CON) 10 MEQ tablet Take 2 tablets (20 mEq total) by mouth daily.  . [EXPIRED] cyanocobalamin ((VITAMIN B-12)) injection 1,000 mcg      Orders Placed This Encounter  Procedures  . Methylmalonic Acid  . Vitamin B12  . Magnesium  . Comprehensive metabolic panel  . TSH  . Lipid panel    No Follow-up on file.

## 2014-02-20 NOTE — Patient Instructions (Signed)
Your arm symptoms may be due to a b12 deficiency  i am repeating your b12 level today, if it is not the case, I will let you know and I advise getting x rays of the cervical spine and NOT seeing a chiropractor  I want you to lose 23 lbs over the next 6 months using a low glycemic index diet and regular exercise   This is  my version of a  "Low GI"  Diet:  It will still lower your blood sugars and allow you to lose 4 to 8  lbs  per month if you follow it carefully.  Your goal with exercise is a minimum of 30 minutes of aerobic exercise 5 days per week (Walking does not count once it becomes easy!)    All of the foods can be found at grocery stores and in bulk at [employer].  The Atkins protein bars and shakes are available in more varieties at Target, WalMart and [employer].     7 AM Breakfast:  Choose from the following:  Low carbohydrate Protein  Shakes (I recommend the EAS AdvantEdge "Carb Control" shakes  Or the low carb shakes by Atkins.    2.5 carbs   Arnold's "Sandwhich Thin"toasted  w/ peanut butter (no jelly: about 20 net carbs  "Bagel Thin" with cream cheese and salmon: about 20 carbs   a scrambled egg/bacon/cheese burrito made with Mission's "carb balance" whole wheat tortilla  (about 10 net carbs )   Avoid cereal and bananas, oatmeal and cream of wheat and grits. They are loaded with carbohydrates!   10 AM: high protein snack  Protein bar by Atkins (the snack size, under 200 cal, usually < 6 net carbs).    A stick of cheese:  Around 1 carb,  100 cal     Dannon Light n Fit [place] Yogurt  (80 cal, 8 carbs)  Other so called "protein bars" and Greek yogurts tend to be loaded with carbohydrates.  Remember, in food advertising, the word "energy" is synonymous for " carbohydrate."  Lunch:   A Sandwich using the bread choices listed, Can use any  Eggs,  lunchmeat, grilled meat or canned tuna), avocado, regular mayo/mustard  and cheese.  A Salad using blue cheese, ranch,  Goddess or  vinagrette,  No croutons or "confetti" and no "candied nuts" but regular nuts OK.   No pretzels or chips.  Pickles and miniature sweet peppers are a good low carb alternative that provide a "crunch"  The bread is the only source of carbohydrate in a sandwich and  can be decreased by trying some of these alternatives to traditional loaf bread  Joseph's makes a pita bread and a flat bread that are 50 cal and 4 net carbs available at [place] and [place].  This can be toasted to use with hummous as well  Toufayan makes a low carb flatbread that's 100 cal and 9 net carbs available at [employer] and BJ's makes 2 sizes of  Low carb whole wheat tortilla  (The large one is 210 cal and 6 net carbs) Avoid "Low fat dressings, as well as Barry Brunner and [place] dressings They are loaded with sugar!   3 PM/ Mid day  Snack:  Consider  1 ounce of  almonds, walnuts, pistachios, pecans, peanuts,  Macadamia nuts or a nut medley.  Avoid "granola"; the dried cranberries and raisins are loaded with carbohydrates. Mixed nuts as long as there are no raisins,  cranberries  or dried fruit.    Try the prosciutto/mozzarella cheese sticks by [employer]  In deli /backery section   High protein      6 PM  Dinner:     Meat/fowl/fish with a green salad, and either broccoli, cauliflower, green beans, spinach, brussel sprouts or  Lima beans. DO NOT BREAD THE PROTEIN!!      There is a low carb pasta by Dreamfield's that is acceptable and tastes great: only 5 digestible carbs/serving.( All grocery stores but BJs carry it )  Try Hurley Cisco Angelo's chicken piccata or chicken or eggplant parm over low carb pasta.(Lowes and BJs)   Marjory Lies Sanchez's "Carnitas" (pulled pork, no sauce,  0 carbs) or his beef pot roast to make a dinner burrito (at BJ's)  Pesto over low carb pasta (bj's sells a good quality pesto in the center refrigerated section of the deli   Try satueeing  Cheral Marker with mushroooms  Whole wheat pasta is still full of  digestible carbs and  Not as low in glycemic index as Dreamfield's.   Brown rice is still rice,  So skip the rice and noodles if you eat [place] or [place] (or at least limit to 1/2 cup)  9 PM snack :   Breyer's "low carb" fudgsicle or  ice cream bar (Carb Smart line), or  Weight Watcher's ice cream bar , or another "no sugar added" ice cream;  a serving of fresh berries/cherries with whipped cream   Cheese or DANNON'S LlGHT N FIT GREEK YOGURT  8 ounces of [place] unsweetened almond/cococunut milk    Avoid bananas, pineapple, grapes  and watermelon on a regular basis because they are high in sugar.  THINK OF THEM AS DESSERT  Remember that snack Substitutions should be less than 10 NET carbs per serving and meals < 20 carbs. Remember to subtract fiber grams to get the "net carbs."

## 2014-02-20 NOTE — Assessment & Plan Note (Signed)
Checking again today with mg level.

## 2014-02-21 ENCOUNTER — Encounter: Payer: Self-pay | Admitting: *Deleted

## 2014-02-21 ENCOUNTER — Telehealth: Payer: Self-pay | Admitting: Internal Medicine

## 2014-02-21 NOTE — Telephone Encounter (Signed)
Relevant patient education mailed to patient.  

## 2014-02-22 ENCOUNTER — Other Ambulatory Visit: Payer: Self-pay | Admitting: Internal Medicine

## 2014-02-23 LAB — METHYLMALONIC ACID, SERUM: Methylmalonic Acid, Quant: 107 nmol/L (ref 87–318)

## 2014-02-24 NOTE — Telephone Encounter (Signed)
Last OV 6.18.15, last refill 5.6.15.  Please advise refill

## 2014-02-25 ENCOUNTER — Telehealth: Payer: Self-pay | Admitting: *Deleted

## 2014-02-25 ENCOUNTER — Ambulatory Visit (INDEPENDENT_AMBULATORY_CARE_PROVIDER_SITE_OTHER): Payer: Medicare Other | Admitting: *Deleted

## 2014-02-25 ENCOUNTER — Ambulatory Visit: Payer: Medicare Other

## 2014-02-25 DIAGNOSIS — E538 Deficiency of other specified B group vitamins: Secondary | ICD-10-CM | POA: Diagnosis not present

## 2014-02-25 MED ORDER — CITALOPRAM HYDROBROMIDE 20 MG PO TABS: 20.0000 mg | ORAL_TABLET | Freq: Every day | ORAL | Status: DC

## 2014-02-25 MED ORDER — CYANOCOBALAMIN 1000 MCG/ML IJ SOLN
1000.0000 ug | Freq: Once | INTRAMUSCULAR | Status: AC
  Administered 2014-02-25: 1000 ug via INTRAMUSCULAR

## 2014-02-25 NOTE — Telephone Encounter (Signed)
Ok to refill,  Refill sent  

## 2014-02-25 NOTE — Telephone Encounter (Signed)
Fax from pharmacy requesting Citalopram 20mg  one tab QD  #30.  Please advise refill

## 2014-02-26 ENCOUNTER — Ambulatory Visit (INDEPENDENT_AMBULATORY_CARE_PROVIDER_SITE_OTHER): Payer: Medicare Other | Admitting: *Deleted

## 2014-02-26 DIAGNOSIS — E538 Deficiency of other specified B group vitamins: Secondary | ICD-10-CM | POA: Diagnosis not present

## 2014-02-26 MED ORDER — CYANOCOBALAMIN 1000 MCG/ML IJ SOLN
1000.0000 ug | Freq: Once | INTRAMUSCULAR | Status: AC
  Administered 2014-02-26: 1000 ug via INTRAMUSCULAR

## 2014-02-27 ENCOUNTER — Telehealth: Payer: Self-pay | Admitting: *Deleted

## 2014-02-27 ENCOUNTER — Ambulatory Visit (INDEPENDENT_AMBULATORY_CARE_PROVIDER_SITE_OTHER): Payer: Medicare Other | Admitting: *Deleted

## 2014-02-27 DIAGNOSIS — E538 Deficiency of other specified B group vitamins: Secondary | ICD-10-CM | POA: Diagnosis not present

## 2014-02-27 MED ORDER — FUROSEMIDE 20 MG PO TABS: ORAL_TABLET | ORAL | Status: DC

## 2014-02-27 MED ORDER — CITALOPRAM HYDROBROMIDE 20 MG PO TABS: 20.0000 mg | ORAL_TABLET | Freq: Every day | ORAL | Status: DC

## 2014-02-27 MED ORDER — LOSARTAN POTASSIUM 50 MG PO TABS: ORAL_TABLET | ORAL | Status: DC

## 2014-02-27 MED ORDER — CYANOCOBALAMIN 1000 MCG/ML IJ SOLN
1000.0000 ug | Freq: Once | INTRAMUSCULAR | Status: AC
  Administered 2014-02-27: 1000 ug via INTRAMUSCULAR

## 2014-02-27 NOTE — Telephone Encounter (Signed)
Pt came in today for B12 injection, and asked me about his resent refills.  Pt states that you and him talked 90 day prescription for each medication, with a refill to get him through to his 6 mth follow up appt.  However the Rx's were all sent in differently some with no refills one with 11 refills, all only 30 day supply.  Medications = Furosemide, [place] and Citalopram.   Okay to send all Rx's for 90 day supply with 1 refill?

## 2014-02-28 ENCOUNTER — Ambulatory Visit (INDEPENDENT_AMBULATORY_CARE_PROVIDER_SITE_OTHER): Payer: Medicare Other | Admitting: *Deleted

## 2014-02-28 DIAGNOSIS — E538 Deficiency of other specified B group vitamins: Secondary | ICD-10-CM | POA: Diagnosis not present

## 2014-02-28 MED ORDER — CYANOCOBALAMIN 1000 MCG/ML IJ SOLN
1000.0000 ug | Freq: Once | INTRAMUSCULAR | Status: AC
  Administered 2014-02-28: 1000 ug via INTRAMUSCULAR

## 2014-03-04 NOTE — Telephone Encounter (Signed)
90 day refill was ordered on citalopram on June 25th

## 2014-03-04 NOTE — Telephone Encounter (Signed)
Last refill 5.6.15, last OV 6.18.15.  Please advise refill.

## 2014-03-07 NOTE — Telephone Encounter (Signed)
PLEASE READ NOTES  FROM ME.  THIS IS MY THIRD TIME RESPONDING TO REFILL REQUEST

## 2014-03-10 ENCOUNTER — Ambulatory Visit (INDEPENDENT_AMBULATORY_CARE_PROVIDER_SITE_OTHER): Payer: Medicare Other | Admitting: *Deleted

## 2014-03-10 DIAGNOSIS — E538 Deficiency of other specified B group vitamins: Secondary | ICD-10-CM

## 2014-03-10 MED ORDER — CYANOCOBALAMIN 1000 MCG/ML IJ SOLN
1000.0000 ug | Freq: Once | INTRAMUSCULAR | Status: AC
  Administered 2014-03-10: 1000 ug via INTRAMUSCULAR

## 2014-03-18 ENCOUNTER — Ambulatory Visit (INDEPENDENT_AMBULATORY_CARE_PROVIDER_SITE_OTHER): Payer: Medicare Other | Admitting: *Deleted

## 2014-03-18 DIAGNOSIS — E538 Deficiency of other specified B group vitamins: Secondary | ICD-10-CM | POA: Diagnosis not present

## 2014-03-18 MED ORDER — CYANOCOBALAMIN 1000 MCG/ML IJ SOLN
1000.0000 ug | Freq: Once | INTRAMUSCULAR | Status: AC
  Administered 2014-03-18: 1000 ug via INTRAMUSCULAR

## 2014-03-25 ENCOUNTER — Ambulatory Visit (INDEPENDENT_AMBULATORY_CARE_PROVIDER_SITE_OTHER): Payer: Medicare Other | Admitting: *Deleted

## 2014-03-25 DIAGNOSIS — E538 Deficiency of other specified B group vitamins: Secondary | ICD-10-CM | POA: Diagnosis not present

## 2014-03-25 MED ORDER — CYANOCOBALAMIN 1000 MCG/ML IJ SOLN
1000.0000 ug | Freq: Once | INTRAMUSCULAR | Status: AC
  Administered 2014-03-25: 1000 ug via INTRAMUSCULAR

## 2014-04-01 ENCOUNTER — Ambulatory Visit (INDEPENDENT_AMBULATORY_CARE_PROVIDER_SITE_OTHER): Payer: Medicare Other | Admitting: *Deleted

## 2014-04-01 DIAGNOSIS — E538 Deficiency of other specified B group vitamins: Secondary | ICD-10-CM

## 2014-04-01 MED ORDER — CYANOCOBALAMIN 1000 MCG/ML IJ SOLN
1000.0000 ug | Freq: Once | INTRAMUSCULAR | Status: AC
  Administered 2014-04-01: 1000 ug via INTRAMUSCULAR

## 2014-05-06 ENCOUNTER — Ambulatory Visit (INDEPENDENT_AMBULATORY_CARE_PROVIDER_SITE_OTHER): Payer: Medicare Other | Admitting: *Deleted

## 2014-05-06 DIAGNOSIS — E538 Deficiency of other specified B group vitamins: Secondary | ICD-10-CM | POA: Diagnosis not present

## 2014-05-06 MED ORDER — CYANOCOBALAMIN 1000 MCG/ML IJ SOLN
1000.0000 ug | Freq: Once | INTRAMUSCULAR | Status: AC
  Administered 2014-05-06: 1000 ug via INTRAMUSCULAR

## 2014-05-07 DIAGNOSIS — M67919 Unspecified disorder of synovium and tendon, unspecified shoulder: Secondary | ICD-10-CM | POA: Diagnosis not present

## 2014-05-07 DIAGNOSIS — M719 Bursopathy, unspecified: Secondary | ICD-10-CM | POA: Diagnosis not present

## 2014-05-15 DIAGNOSIS — M1710 Unilateral primary osteoarthritis, unspecified knee: Secondary | ICD-10-CM | POA: Diagnosis not present

## 2014-05-15 DIAGNOSIS — M25569 Pain in unspecified knee: Secondary | ICD-10-CM | POA: Diagnosis not present

## 2014-05-15 DIAGNOSIS — R262 Difficulty in walking, not elsewhere classified: Secondary | ICD-10-CM | POA: Diagnosis not present

## 2014-05-20 DIAGNOSIS — M25569 Pain in unspecified knee: Secondary | ICD-10-CM | POA: Diagnosis not present

## 2014-05-20 DIAGNOSIS — M1710 Unilateral primary osteoarthritis, unspecified knee: Secondary | ICD-10-CM | POA: Diagnosis not present

## 2014-05-20 DIAGNOSIS — R262 Difficulty in walking, not elsewhere classified: Secondary | ICD-10-CM | POA: Diagnosis not present

## 2014-05-22 DIAGNOSIS — M1710 Unilateral primary osteoarthritis, unspecified knee: Secondary | ICD-10-CM | POA: Diagnosis not present

## 2014-05-22 DIAGNOSIS — M25569 Pain in unspecified knee: Secondary | ICD-10-CM | POA: Diagnosis not present

## 2014-05-22 DIAGNOSIS — R262 Difficulty in walking, not elsewhere classified: Secondary | ICD-10-CM | POA: Diagnosis not present

## 2014-05-27 DIAGNOSIS — M25569 Pain in unspecified knee: Secondary | ICD-10-CM | POA: Diagnosis not present

## 2014-05-27 DIAGNOSIS — M1710 Unilateral primary osteoarthritis, unspecified knee: Secondary | ICD-10-CM | POA: Diagnosis not present

## 2014-05-27 DIAGNOSIS — R262 Difficulty in walking, not elsewhere classified: Secondary | ICD-10-CM | POA: Diagnosis not present

## 2014-05-29 DIAGNOSIS — M25569 Pain in unspecified knee: Secondary | ICD-10-CM | POA: Diagnosis not present

## 2014-05-29 DIAGNOSIS — M1710 Unilateral primary osteoarthritis, unspecified knee: Secondary | ICD-10-CM | POA: Diagnosis not present

## 2014-05-29 DIAGNOSIS — R262 Difficulty in walking, not elsewhere classified: Secondary | ICD-10-CM | POA: Diagnosis not present

## 2014-06-03 DIAGNOSIS — R262 Difficulty in walking, not elsewhere classified: Secondary | ICD-10-CM | POA: Diagnosis not present

## 2014-06-03 DIAGNOSIS — M25569 Pain in unspecified knee: Secondary | ICD-10-CM | POA: Diagnosis not present

## 2014-06-03 DIAGNOSIS — M1710 Unilateral primary osteoarthritis, unspecified knee: Secondary | ICD-10-CM | POA: Diagnosis not present

## 2014-06-04 DIAGNOSIS — M1710 Unilateral primary osteoarthritis, unspecified knee: Secondary | ICD-10-CM | POA: Diagnosis not present

## 2014-06-05 DIAGNOSIS — M25561 Pain in right knee: Secondary | ICD-10-CM | POA: Diagnosis not present

## 2014-06-05 DIAGNOSIS — M170 Bilateral primary osteoarthritis of knee: Secondary | ICD-10-CM | POA: Diagnosis not present

## 2014-06-05 DIAGNOSIS — R262 Difficulty in walking, not elsewhere classified: Secondary | ICD-10-CM | POA: Diagnosis not present

## 2014-06-10 ENCOUNTER — Ambulatory Visit (INDEPENDENT_AMBULATORY_CARE_PROVIDER_SITE_OTHER): Payer: Medicare Other | Admitting: *Deleted

## 2014-06-10 DIAGNOSIS — E538 Deficiency of other specified B group vitamins: Secondary | ICD-10-CM

## 2014-06-10 MED ORDER — CYANOCOBALAMIN 1000 MCG/ML IJ SOLN
1000.0000 ug | Freq: Once | INTRAMUSCULAR | Status: AC
  Administered 2014-06-10: 1000 ug via INTRAMUSCULAR

## 2014-07-10 ENCOUNTER — Encounter: Payer: Self-pay | Admitting: Internal Medicine

## 2014-07-10 ENCOUNTER — Ambulatory Visit (INDEPENDENT_AMBULATORY_CARE_PROVIDER_SITE_OTHER): Payer: Medicare Other | Admitting: Internal Medicine

## 2014-07-10 VITALS — BP 118/70 | HR 70 | Temp 97.8°F | Resp 16 | Ht 67.5 in | Wt 205.5 lb

## 2014-07-10 DIAGNOSIS — I10 Essential (primary) hypertension: Secondary | ICD-10-CM

## 2014-07-10 DIAGNOSIS — G5693 Unspecified mononeuropathy of bilateral upper limbs: Secondary | ICD-10-CM

## 2014-07-10 DIAGNOSIS — Z125 Encounter for screening for malignant neoplasm of prostate: Secondary | ICD-10-CM

## 2014-07-10 DIAGNOSIS — Z0000 Encounter for general adult medical examination without abnormal findings: Secondary | ICD-10-CM | POA: Diagnosis not present

## 2014-07-10 DIAGNOSIS — E785 Hyperlipidemia, unspecified: Secondary | ICD-10-CM

## 2014-07-10 DIAGNOSIS — H6122 Impacted cerumen, left ear: Secondary | ICD-10-CM | POA: Diagnosis not present

## 2014-07-10 DIAGNOSIS — E538 Deficiency of other specified B group vitamins: Secondary | ICD-10-CM | POA: Diagnosis not present

## 2014-07-10 DIAGNOSIS — Z6830 Body mass index (BMI) 30.0-30.9, adult: Secondary | ICD-10-CM

## 2014-07-10 DIAGNOSIS — H6120 Impacted cerumen, unspecified ear: Secondary | ICD-10-CM | POA: Insufficient documentation

## 2014-07-10 DIAGNOSIS — Z23 Encounter for immunization: Secondary | ICD-10-CM

## 2014-07-10 DIAGNOSIS — R5383 Other fatigue: Secondary | ICD-10-CM | POA: Diagnosis not present

## 2014-07-10 DIAGNOSIS — G629 Polyneuropathy, unspecified: Secondary | ICD-10-CM | POA: Diagnosis not present

## 2014-07-10 DIAGNOSIS — N400 Enlarged prostate without lower urinary tract symptoms: Secondary | ICD-10-CM

## 2014-07-10 DIAGNOSIS — H905 Unspecified sensorineural hearing loss: Secondary | ICD-10-CM

## 2014-07-10 DIAGNOSIS — E559 Vitamin D deficiency, unspecified: Secondary | ICD-10-CM

## 2014-07-10 DIAGNOSIS — H903 Sensorineural hearing loss, bilateral: Secondary | ICD-10-CM

## 2014-07-10 DIAGNOSIS — E669 Obesity, unspecified: Secondary | ICD-10-CM

## 2014-07-10 DIAGNOSIS — Z1211 Encounter for screening for malignant neoplasm of colon: Secondary | ICD-10-CM

## 2014-07-10 DIAGNOSIS — G4733 Obstructive sleep apnea (adult) (pediatric): Secondary | ICD-10-CM

## 2014-07-10 LAB — CBC WITH DIFFERENTIAL/PLATELET
BASOS ABS: 0.1 10*3/uL (ref 0.0–0.1)
Basophils Relative: 0.9 % (ref 0.0–3.0)
Eosinophils Absolute: 0.1 10*3/uL (ref 0.0–0.7)
Eosinophils Relative: 1.7 % (ref 0.0–5.0)
HCT: 46.4 % (ref 39.0–52.0)
HEMOGLOBIN: 15.3 g/dL (ref 13.0–17.0)
LYMPHS ABS: 1.5 10*3/uL (ref 0.7–4.0)
LYMPHS PCT: 23.6 % (ref 12.0–46.0)
MCHC: 33.1 g/dL (ref 30.0–36.0)
MCV: 90 fl (ref 78.0–100.0)
Monocytes Absolute: 0.9 10*3/uL (ref 0.1–1.0)
Monocytes Relative: 13.9 % — ABNORMAL HIGH (ref 3.0–12.0)
NEUTROS ABS: 3.9 10*3/uL (ref 1.4–7.7)
Neutrophils Relative %: 59.9 % (ref 43.0–77.0)
PLATELETS: 276 10*3/uL (ref 150.0–400.0)
RBC: 5.15 Mil/uL (ref 4.22–5.81)
RDW: 13.3 % (ref 11.5–15.5)
WBC: 6.5 10*3/uL (ref 4.0–10.5)

## 2014-07-10 LAB — TSH: TSH: 0.88 u[IU]/mL (ref 0.35–4.50)

## 2014-07-10 LAB — VITAMIN D 25 HYDROXY (VIT D DEFICIENCY, FRACTURES): VITD: 12.57 ng/mL — AB (ref 30.00–100.00)

## 2014-07-10 LAB — COMPREHENSIVE METABOLIC PANEL
ALK PHOS: 70 U/L (ref 39–117)
ALT: 17 U/L (ref 0–53)
AST: 18 U/L (ref 0–37)
Albumin: 3.7 g/dL (ref 3.5–5.2)
BILIRUBIN TOTAL: 1.6 mg/dL — AB (ref 0.2–1.2)
BUN: 18 mg/dL (ref 6–23)
CO2: 28 meq/L (ref 19–32)
CREATININE: 1.2 mg/dL (ref 0.4–1.5)
Calcium: 9.6 mg/dL (ref 8.4–10.5)
Chloride: 103 mEq/L (ref 96–112)
GFR: 64.01 mL/min (ref >60.00)
Glucose, Bld: 89 mg/dL (ref 70–99)
Potassium: 4.6 mEq/L (ref 3.5–5.1)
SODIUM: 138 meq/L (ref 135–145)
TOTAL PROTEIN: 6.9 g/dL (ref 6.0–8.3)

## 2014-07-10 LAB — LIPID PANEL
Cholesterol: 146 mg/dL (ref 0–200)
HDL: 28.3 mg/dL — AB (ref >39.00)
LDL Cholesterol: 98 mg/dL (ref 0–99)
NonHDL: 117.7
TRIGLYCERIDES: 99 mg/dL (ref 0.0–149.0)
Total CHOL/HDL Ratio: 5
VLDL: 19.8 mg/dL (ref 0.0–40.0)

## 2014-07-10 LAB — PSA, MEDICARE: PSA: 0.96 ng/mL (ref 0.10–4.00)

## 2014-07-10 MED ORDER — CYANOCOBALAMIN 1000 MCG/ML IJ SOLN
1000.0000 ug | Freq: Once | INTRAMUSCULAR | Status: AC
  Administered 2014-07-10: 1000 ug via INTRAMUSCULAR

## 2014-07-10 NOTE — Patient Instructions (Addendum)
YOU CAN USE AN LIQUID CALLED DEBROX TP HELP DISSOLVE THE WAX BUILDUP IN YOUR LEFT EAR   Great work on the [employer]!!  You're more than halfway there   You had your annual  wellness exam today.    We will contact you with the bloodwork results  Health Maintenance A healthy lifestyle and preventative care can promote health and wellness.  Maintain regular health, dental, and eye exams.  Eat a healthy diet. Foods like vegetables, fruits, whole grains, low-fat dairy products, and lean protein foods contain the nutrients you need and are low in calories. Decrease your intake of foods high in solid fats, added sugars, and salt. Get information about a proper diet from your health care provider, if necessary.  Regular physical exercise is one of the most important things you can do for your health. Most adults should get at least 150 minutes of moderate-intensity exercise (any activity that increases your heart rate and causes you to sweat) each week. In addition, most adults need muscle-strengthening exercises on 2 or more days a week.   Maintain a healthy weight. The body mass index (BMI) is a screening tool to identify possible weight problems. It provides an estimate of body fat based on height and weight. Your health care provider can find your BMI and can help you achieve or maintain a healthy weight. For males 20 years and older:  A BMI below 18.5 is considered underweight.  A BMI of 18.5 to 24.9 is normal.  A BMI of 25 to 29.9 is considered overweight.  A BMI of 30 and above is considered obese.  Maintain normal blood lipids and cholesterol by exercising and minimizing your intake of saturated fat. Eat a balanced diet with plenty of fruits and vegetables. Blood tests for lipids and cholesterol should begin at age 59 and be repeated every 5 years. If your lipid or cholesterol levels are high, you are over age 35, or you are at high risk for heart disease, you may need your cholesterol  levels checked more frequently.Ongoing high lipid and cholesterol levels should be treated with medicines if diet and exercise are not working.  If you smoke, find out from your health care provider how to quit. If you do not use tobacco, do not start.  Lung cancer screening is recommended for adults aged 34-80 years who are at high risk for developing lung cancer because of a history of smoking. A yearly low-dose CT scan of the lungs is recommended for people who have at least a 30-pack-year history of smoking and are current smokers or have quit within the past 15 years. A pack year of smoking is smoking an average of 1 pack of cigarettes a day for 1 year (for example, a 30-pack-year history of smoking could mean smoking 1 pack a day for 30 years or 2 packs a day for 15 years). Yearly screening should continue until the smoker has stopped smoking for at least 15 years. Yearly screening should be stopped for people who develop a health problem that would prevent them from having lung cancer treatment.  If you choose to drink alcohol, do not have more than 2 drinks per day. One drink is considered to be 12 oz (360 mL) of beer, 5 oz (150 mL) of wine, or 1.5 oz (45 mL) of liquor.  Avoid the use of street drugs. Do not share needles with anyone. Ask for help if you need support or instructions about stopping the use of drugs.  High blood pressure causes heart disease and increases the risk of stroke. Blood pressure should be checked at least every 1-2 years. Ongoing high blood pressure should be treated with medicines if weight loss and exercise are not effective.  If you are 2-55 years old, ask your health care provider if you should take aspirin to prevent heart disease.  Diabetes screening involves taking a blood sample to check your fasting blood sugar level. This should be done once every 3 years after age 55 if you are at a normal weight and without risk factors for diabetes. Testing should be  considered at a younger age or be carried out more frequently if you are overweight and have at least 1 risk factor for diabetes.  Colorectal cancer can be detected and often prevented. Most routine colorectal cancer screening begins at the age of 50 and continues through age 80. However, your health care provider may recommend screening at an earlier age if you have risk factors for colon cancer. On a yearly basis, your health care provider may provide home test kits to check for hidden blood in the stool. A small camera at the end of a tube may be used to directly examine the colon (sigmoidoscopy or colonoscopy) to detect the earliest forms of colorectal cancer. Talk to your health care provider about this at age 60 when routine screening begins. A direct exam of the colon should be repeated every 5-10 years through age 36, unless early forms of precancerous polyps or small growths are found.  People who are at an increased risk for hepatitis B should be screened for this virus. You are considered at high risk for hepatitis B if:  You were born in a country where hepatitis B occurs often. Talk with your health care provider about which countries are considered high risk.  Your parents were born in a high-risk country and you have not received a shot to protect against hepatitis B (hepatitis B vaccine).  You have HIV or AIDS.  You use needles to inject street drugs.  You live with, or have sex with, someone who has hepatitis B.  You are a man who has sex with other men (MSM).  You get hemodialysis treatment.  You take certain medicines for conditions like cancer, organ transplantation, and autoimmune conditions.  Hepatitis C blood testing is recommended for all people born from [birth year] through 1965 and any individual with known risk factors for hepatitis C.  Healthy men should no longer receive prostate-specific antigen (PSA) blood tests as part of routine cancer screening. Talk to your health  care provider about prostate cancer screening.  Testicular cancer screening is not recommended for adolescents or adult males who have no symptoms. Screening includes self-exam, a health care provider exam, and other screening tests. Consult with your health care provider about any symptoms you have or any concerns you have about testicular cancer.  Practice safe sex. Use condoms and avoid high-risk sexual practices to reduce the spread of sexually transmitted infections (STIs).  You should be screened for STIs, including gonorrhea and chlamydia if:  You are sexually active and are younger than 24 years.  You are older than 24 years, and your health care provider tells you that you are at risk for this type of infection.  Your sexual activity has changed since you were last screened, and you are at an increased risk for chlamydia or gonorrhea. Ask your health care provider if you are at risk.  If you are  at risk of being infected with HIV, it is recommended that you take a prescription medicine daily to prevent HIV infection. This is called pre-exposure prophylaxis (PrEP). You are considered at risk if:  You are a man who has sex with other men (MSM).  You are a heterosexual man who is sexually active with multiple partners.  You take drugs by injection.  You are sexually active with a partner who has HIV.  Talk with your health care provider about whether you are at high risk of being infected with HIV. If you choose to begin PrEP, you should first be tested for HIV. You should then be tested every 3 months for as long as you are taking PrEP.  Use sunscreen. Apply sunscreen liberally and repeatedly throughout the day. You should seek shade when your shadow is shorter than you. Protect yourself by wearing long sleeves, pants, a wide-brimmed hat, and sunglasses year round whenever you are outdoors.  Tell your health care provider of new moles or changes in moles, especially if there is a  change in shape or color. Also, tell your health care provider if a mole is larger than the size of a pencil eraser.  A one-time screening for abdominal aortic aneurysm (AAA) and surgical repair of large AAAs by ultrasound is recommended for men aged 72-75 years who are current or former smokers.  Stay current with your vaccines (immunizations). Document Released: 02/18/2008 Document Revised: 08/27/2013 Document Reviewed: 01/17/2011 [facility] Patient Information 2015 [place], [place]. This information is not intended to replace advice given to you by your health care provider. Make sure you discuss any questions you have with your health care provider.

## 2014-07-10 NOTE — Progress Notes (Signed)
Pre-visit discussion using our clinic review tool. No additional management support is needed unless otherwise documented below in the visit note.  

## 2014-07-10 NOTE — Progress Notes (Signed)
Patient ID: Austin Duran, male   DOB: [birth year], 68 y.o.   MRN: [id number]  The patient is here for annual Medicare wellness examination and follow up on  chronic problems including hypertension, dysliipdemia, obesity, untreated OSA  and degenerative disk disease.    He has been having bilateral arm numbness intermittently,  He works out  [place] .   He is SLEEPING A LOT,  WAKES UP TIRED AND SLEEPY.  HAS OSA AND REFUSES TO USE IT.   Has had a 12 LB WT LOSS INTENTIONALLY BY GIVing IN UP JUNK FOOD and exercising.  Annual PSA and DRE was  done today     The risk factors are reflected in the social history.  The roster of all physicians providing medical care to patient - is listed in the Snapshot section of the chart.  Activities of daily living:  The patient is 100% independent in all ADLs: dressing, toileting, feeding as well as independent mobility  Home safety : The patient has smoke detectors in the home. They wear seatbelts.  There are no firearms at home. There is no violence in the home.   There is no risks for hepatitis, STDs or HIV. There is no   history of blood transfusion. They have no travel history to infectious disease endemic areas of the world.  The patient has seen their dentist in the last six month. They have seen their eye doctor in the last year. They admit to slight hearing difficulty with regard to whispered voices and some television programs.  They have deferred audiologic testing in the last year.  They do not  have excessive sun exposure. Discussed the need for sun protection: hats, long sleeves and use of sunscreen if there is significant sun exposure.   Diet: the importance of a healthy diet is discussed. They do have a healthy diet.  The benefits of regular aerobic exercise were discussed. he exercises 3 times per week for an hour    Depression screen: there are no signs or vegative symptoms of depression- irritability, change in  appetite, anhedonia, sadness/tearfullness.  Cognitive assessment: the patient manages all their financial and personal affairs and is actively engaged. They could relate day,date,year and events; recalled 2/3 objects at 3 minutes; performed clock-face test normally.  The following portions of the patient's history were reviewed and updated as appropriate: allergies, current medications, past family history, past medical history,  past surgical history, past social history  and problem list.  Visual acuity was not assessed per patient preference since she has regular follow up with her ophthalmologist. Hearing and body mass index were assessed and reviewed.   During the course of the visit the patient was educated and counseled about appropriate screening and preventive services including : fall prevention , diabetes screening, nutrition counseling, colorectal cancer screening, and recommended immunizations.    Objective:  BP 118/70 mmHg  Pulse 70  Temp(Src) 97.8 F (36.6 C) (Oral)  Resp 16  Ht 5' 7.5" (1.715 m)  Wt 205 lb 8 oz (93.214 kg)  BMI 31.69 kg/m2  SpO2 96%  General Appearance:    Alert, cooperative, no distress, appears stated age  Head:    Normocephalic, without obvious abnormality, atraumatic  Eyes:    PERRL, conjunctiva/corneas clear, EOM's intact, fundi    benign, both eyes       Ears:    Normal TM's and external ear canals, both ears  Nose:   Nares normal, septum midline,  mucosa normal, no drainage   or sinus tenderness  Throat:   Lips, mucosa, and tongue normal; teeth and gums normal  Neck:   Supple, symmetrical, trachea midline, no adenopathy;       thyroid:  No enlargement/tenderness/nodules; no carotid   bruit or JVD  Back:     Symmetric, no curvature, ROM normal, no CVA tenderness  Lungs:     Clear to auscultation bilaterally, respirations unlabored  Chest wall:    No tenderness or deformity  Heart:    Regular rate and rhythm, S1 and S2 normal, no murmur, rub    or gallop  Abdomen:     Soft, non-tender, bowel sounds active all four quadrants,    no masses, no organomegaly  Genitalia:    Normal male without lesion, discharge or tenderness  Rectal:    Normal tone, normal prostate, no masses or tenderness;   guaiac negative stool  Extremities:   Extremities normal, atraumatic, no cyanosis or edema  Pulses:   2+ and symmetric all extremities  Skin:   Skin color, texture, turgor normal, no rashes or lesions  Lymph nodes:   Cervical, supraclavicular, and axillary nodes normal  Neurologic:   CNII-XII intact. Normal strength, sensation and reflexes      throughout    Assessment and plan:  Bilateral neuropathy of upper extremities Suggestive of cervical disk disease. Has had rare episodes of numbness accompanied by weakness .  Episodes are transient and occur bilaterally, Discussed workup which would include plain films followed by MRI if plain films suggest. Spinal stenosis.  Hypertension Well controlled on current regimen. Renal function stable, no changes today.  Lab Results  Component Value Date   CREATININE 1.2 07/10/2014   Lab Results  Component Value Date   NA 138 07/10/2014   K 4.6 07/10/2014   CL 103 07/10/2014   CO2 28 07/10/2014     Cerumen impaction Recommended use of Debrox and return for irrigation   Enlarged prostate without lower urinary tract symptoms (luts) Currently asymptomatic.   Lab Results  Component Value Date   PSA 0.96 07/10/2014   PSA 0.88 07/02/2013   PSA 0.76 01/02/2012     Encounter for Medicare annual wellness exam Annual Medicare wellness  exam was done as well as a comprehensive physical exam and management of acute and chronic conditions .  During the course of the visit the patient was educated and counseled about appropriate screening and preventive services including : fall prevention , diabetes screening, nutrition counseling, colorectal cancer screening, and recommended immunizations.  Printed  recommendations for health maintenance screenings was given.   Dyslipidemia with elevated low density lipoprotein cholesterol and abnormally low high density lipoprotein cholesterol LDL and triglycerides are at goal on current medications. He has no side effects and liver enzymes are normal. No changes today  Lab Results  Component Value Date   CHOL 146 07/10/2014   HDL 28.30* 07/10/2014   LDLCALC 98 07/10/2014   TRIG 99.0 07/10/2014   CHOLHDL 5 07/10/2014         Screening for prostate cancer Annual PSA and DRE was  done today   Lab Results  Component Value Date   PSA 0.96 07/10/2014   PSA 0.88 07/02/2013   PSA 0.76 01/02/2012     Obesity (BMI 30-39.9) I have congratulated him in reduction of   BMI and encouraged  Continued weight loss with goal of 10% of body weight over the next 6 months using a low glycemic index diet  and regular exercise a minimum of 5 days per week.    OSA (obstructive sleep apnea) diagnosed with prior sleep study but treatment has been continually deferred d by patient.  Discussed the long term history of OSA , the risks of long term damage to heart and the signs and symptoms attributable to OSA.  Advised patient to consider  significant weight loss and/or use of CPAP.   B12 deficiency Found during workup for neuropathy.  Continue supplementation and recheck level.   Lab Results  Component Value Date   VITAMINB12 191* 02/20/2014     Hearing loss, sensorineural, asymmetrical Right greater than left , occupational.  prior hearing study  ,He remains uninterested in hearing aids ,    Vitamin D deficiency Recurrent requiring replacement with megadose for level < 15 again,    Updated Medication List Outpatient Encounter Prescriptions as of 07/10/2014  Medication Sig  . aspirin 81 MG tablet Take 81 mg by mouth daily.    . Cholecalciferol (VITAMIN D-3 PO) Take 1 tablet by mouth daily.  . citalopram (CELEXA) 20 MG tablet TAKE 1 TABLET BY MOUTH  EVERY DAY  . ergocalciferol (DRISDOL) 50000 UNITS capsule Take 1 capsule (50,000 Units total) by mouth once a week. For 3 months  . furosemide (LASIX) 20 MG tablet TAKE 1 TABLET BY MOUTH EVERY DAY  . losartan (COZAAR) 50 MG tablet TAKE 1 TABLET BY MOUTH DAILY  . potassium chloride (K-DUR,KLOR-CON) 10 MEQ tablet Take 2 tablets (20 mEq total) by mouth daily.  . [EXPIRED] cyanocobalamin ((VITAMIN B-12)) injection 1,000 mcg

## 2014-07-10 NOTE — Assessment & Plan Note (Addendum)
Suggestive of cervical disk disease. Has had rare episodes of numbness accompanied by weakness .  Episodes are transient and occur bilaterally, Discussed workup which would include plain films followed by MRI if plain films suggest. Spinal stenosis.

## 2014-07-13 DIAGNOSIS — G4733 Obstructive sleep apnea (adult) (pediatric): Secondary | ICD-10-CM | POA: Insufficient documentation

## 2014-07-13 NOTE — Assessment & Plan Note (Signed)
Recurrent requiring replacement with megadose for level < 15 again,

## 2014-07-13 NOTE — Assessment & Plan Note (Signed)
Found during workup for neuropathy.  Continue supplementation and recheck level.   Lab Results  Component Value Date   [id number] 191* 02/20/2014

## 2014-07-13 NOTE — Assessment & Plan Note (Signed)
Well controlled on current regimen. Renal function stable, no changes today.  Lab Results  Component Value Date   CREATININE 1.2 07/10/2014   Lab Results  Component Value Date   NA 138 07/10/2014   K 4.6 07/10/2014   CL 103 07/10/2014   CO2 28 07/10/2014

## 2014-07-13 NOTE — Assessment & Plan Note (Signed)
I have congratulated him in reduction of   BMI and encouraged  Continued weight loss with goal of 10% of body weight over the next 6 months using a low glycemic index diet and regular exercise a minimum of 5 days per week.

## 2014-07-13 NOTE — Assessment & Plan Note (Signed)
Currently asymptomatic.   Lab Results  Component Value Date   PSA 0.96 07/10/2014   PSA 0.88 07/02/2013   PSA 0.76 01/02/2012

## 2014-07-13 NOTE — Assessment & Plan Note (Signed)

## 2014-07-13 NOTE — Assessment & Plan Note (Signed)
Recommended use of Debrox and return for irrigation

## 2014-07-13 NOTE — Assessment & Plan Note (Signed)
Right greater than left , occupational.  prior hearing study  ,He remains uninterested in hearing aids ,

## 2014-07-13 NOTE — Assessment & Plan Note (Signed)
diagnosed with prior sleep study but treatment has been continually deferred d by patient.  Discussed the long term history of OSA , the risks of long term damage to heart and the signs and symptoms attributable to OSA.  Advised patient to consider  significant weight loss and/or use of CPAP.

## 2014-07-13 NOTE — Assessment & Plan Note (Signed)
LDL and triglycerides are at goal on current medications. He has no side effects and liver enzymes are normal. No changes today  Lab Results  Component Value Date   CHOL 146 07/10/2014   HDL 28.30* 07/10/2014   LDLCALC 98 07/10/2014   TRIG 99.0 07/10/2014   CHOLHDL 5 07/10/2014

## 2014-07-13 NOTE — Assessment & Plan Note (Signed)
Annual PSA and DRE was  done today   Lab Results  Component Value Date   PSA 0.96 07/10/2014   PSA 0.88 07/02/2013   PSA 0.76 01/02/2012

## 2014-07-14 ENCOUNTER — Encounter: Payer: Self-pay | Admitting: *Deleted

## 2014-09-11 ENCOUNTER — Ambulatory Visit: Payer: Self-pay | Admitting: Internal Medicine

## 2014-09-11 DIAGNOSIS — I10 Essential (primary) hypertension: Secondary | ICD-10-CM | POA: Diagnosis not present

## 2014-09-11 DIAGNOSIS — J329 Chronic sinusitis, unspecified: Secondary | ICD-10-CM | POA: Diagnosis not present

## 2014-09-11 DIAGNOSIS — Z87891 Personal history of nicotine dependence: Secondary | ICD-10-CM | POA: Diagnosis not present

## 2014-09-11 DIAGNOSIS — Z9849 Cataract extraction status, unspecified eye: Secondary | ICD-10-CM | POA: Diagnosis not present

## 2014-09-11 DIAGNOSIS — Z7982 Long term (current) use of aspirin: Secondary | ICD-10-CM | POA: Diagnosis not present

## 2014-09-11 DIAGNOSIS — Z79899 Other long term (current) drug therapy: Secondary | ICD-10-CM | POA: Diagnosis not present

## 2014-09-18 DIAGNOSIS — H26492 Other secondary cataract, left eye: Secondary | ICD-10-CM | POA: Diagnosis not present

## 2014-10-06 ENCOUNTER — Other Ambulatory Visit: Payer: Self-pay | Admitting: Internal Medicine

## 2015-01-08 ENCOUNTER — Encounter: Payer: Self-pay | Admitting: Internal Medicine

## 2015-01-08 ENCOUNTER — Ambulatory Visit (INDEPENDENT_AMBULATORY_CARE_PROVIDER_SITE_OTHER): Payer: Medicare Other | Admitting: Internal Medicine

## 2015-01-08 VITALS — BP 140/86 | HR 67 | Temp 98.0°F | Resp 14 | Ht 68.0 in | Wt 209.0 lb

## 2015-01-08 DIAGNOSIS — N508 Other specified disorders of male genital organs: Secondary | ICD-10-CM

## 2015-01-08 DIAGNOSIS — E784 Other hyperlipidemia: Secondary | ICD-10-CM

## 2015-01-08 DIAGNOSIS — E669 Obesity, unspecified: Secondary | ICD-10-CM

## 2015-01-08 DIAGNOSIS — N5089 Other specified disorders of the male genital organs: Secondary | ICD-10-CM

## 2015-01-08 DIAGNOSIS — N452 Orchitis: Secondary | ICD-10-CM | POA: Diagnosis not present

## 2015-01-08 DIAGNOSIS — E559 Vitamin D deficiency, unspecified: Secondary | ICD-10-CM

## 2015-01-08 DIAGNOSIS — I10 Essential (primary) hypertension: Secondary | ICD-10-CM

## 2015-01-08 DIAGNOSIS — E785 Hyperlipidemia, unspecified: Secondary | ICD-10-CM | POA: Diagnosis not present

## 2015-01-08 DIAGNOSIS — M5442 Lumbago with sciatica, left side: Secondary | ICD-10-CM

## 2015-01-08 LAB — LIPID PANEL
CHOLESTEROL: 132 mg/dL (ref 0–200)
HDL: 35.5 mg/dL — ABNORMAL LOW (ref >39.00)
LDL Cholesterol: 78 mg/dL (ref 0–99)
NONHDL: 96.5
Total CHOL/HDL Ratio: 4
Triglycerides: 95 mg/dL (ref 0.0–149.0)
VLDL: 19 mg/dL (ref 0.0–40.0)

## 2015-01-08 LAB — COMPREHENSIVE METABOLIC PANEL
ALBUMIN: 4.1 g/dL (ref 3.5–5.2)
ALK PHOS: 69 U/L (ref 39–117)
ALT: 16 U/L (ref 0–53)
AST: 18 U/L (ref 0–37)
BILIRUBIN TOTAL: 1.3 mg/dL — AB (ref 0.2–1.2)
BUN: 15 mg/dL (ref 6–23)
CALCIUM: 9.8 mg/dL (ref 8.4–10.5)
CHLORIDE: 104 meq/L (ref 96–112)
CO2: 29 mEq/L (ref 19–32)
CREATININE: 1.07 mg/dL (ref 0.40–1.50)
GFR: 72.96 mL/min (ref >60.00)
Glucose, Bld: 107 mg/dL — ABNORMAL HIGH (ref 70–99)
Potassium: 4.4 mEq/L (ref 3.5–5.1)
Sodium: 138 mEq/L (ref 135–145)
Total Protein: 6.7 g/dL (ref 6.0–8.3)

## 2015-01-08 LAB — VITAMIN D 25 HYDROXY (VIT D DEFICIENCY, FRACTURES): VITD: 12.36 ng/mL — ABNORMAL LOW (ref 30.00–100.00)

## 2015-01-08 MED ORDER — PREDNISONE 10 MG PO TABS: ORAL_TABLET | ORAL | Status: DC

## 2015-01-08 MED ORDER — LOSARTAN POTASSIUM-HCTZ 50-12.5 MG PO TABS: 1.0000 | ORAL_TABLET | Freq: Every day | ORAL | Status: DC

## 2015-01-08 NOTE — Patient Instructions (Addendum)
I am adding hctz to your losartan for better BP control.  New combination pill sent to pharmacy Stop the furosemide for now.  Some of your back pain is radiating into your groin,  The prednisone taper should help.  You do have tender massing your let testicle.  Ordering an ultrasound to check it out.    Your HDL was low last time.  This is your GOOD cholesterol.  .  I recommend the "Mediterranean "diet with  Reduction in starches and incorprating almond or cashew milk diet  your diet as a substitution for cow's milk  [employer]  makes an almond milk;  original formula is very good tasting !

## 2015-01-08 NOTE — Progress Notes (Signed)
Pre-visit discussion using our clinic review tool. No additional management support is needed unless otherwise documented below in the visit note.  

## 2015-01-08 NOTE — Progress Notes (Signed)
Patient ID: Austin Duran, male   DOB: [birth year], 69 y.o.   MRN: [id number]  Patient Active Problem List   Diagnosis Date Noted  . Testicular mass 01/10/2015  . OSA (obstructive sleep apnea) 07/13/2014  . B12 deficiency 07/10/2014  . Cerumen impaction 07/10/2014  . Vitamin D deficiency 07/03/2013  . Enlarged prostate without lower urinary tract symptoms (luts) 07/02/2013  . Bilateral neuropathy of upper extremities 07/02/2013  . Hypertension 07/06/2012  . Basal cell carcinoma of back 01/02/2012  . Hearing loss, sensorineural, asymmetrical 01/02/2012  . S/P bilateral cataract extraction 01/02/2012  . Encounter for Medicare annual wellness exam 01/02/2012  . Obesity (BMI 30-39.9) 01/02/2012  . Screening for colon cancer 08/07/2011  . Screening for prostate cancer 08/07/2011  . Dyslipidemia with elevated low density lipoprotein cholesterol and abnormally low high density lipoprotein cholesterol 08/07/2011  . Lumbago 08/07/2011    Subjective:  CC:   Chief Complaint  Patient presents with  . Follow-up    6 month    HPI:   Austin Duran is a 69 y.o. male who presents for   1) FOLLOW UP ON CHRONIC ISSUES including hypertension, obesity and hyperliipdemia.    2) ONE WEEK history of  LEFT TESTICULAR PAIN . Started with swelling which has resolved,  And improvement in the pain which is less proounded in the morning but becomes more sever as the day progresses.  Pain is not sharp,  More feeling of tenderness .  No penile  discharge, no recent unusual activity,,  No fevers or other systemic symptoms.    Past Medical History  Diagnosis Date  . Hearing loss     had a perforated right TM,   followed by Dr. Tami Duran  . Panic attack   . Hypertension   . Depression   . Panic attacks     Past Surgical History  Procedure Laterality Date  . Tympanic membrane repair      [facility] , right   . Fracture surgery    . Cataract extraction, bilateral  May 2013    Austin Duran   . Eye surgery  2008    eyelid surgery, Dr. Loletta Duran  . Cataract extraction Bilateral Aug 2013    Austin Duran       The following portions of the patient's history were reviewed and updated as appropriate: Allergies, current medications, and problem list.    Review of Systems:   Patient denies headache, fevers, malaise, unintentional weight loss, skin rash, eye pain, sinus congestion and sinus pain, sore throat, dysphagia,  hemoptysis , cough, dyspnea, wheezing, chest pain, palpitations, orthopnea, edema, abdominal pain, nausea, melena, diarrhea, constipation, flank pain, dysuria, hematuria, urinary  Frequency, nocturia, numbness, tingling, seizures,  Focal weakness, Loss of consciousness,  Tremor, insomnia, depression, anxiety, and suicidal ideation.     History   Social History  . Marital Status: Married    Spouse Name: N/A  . Number of Children: N/A  . Years of Education: N/A   Occupational History  . Not on file.   Social History Main Topics  . Smoking status: Former Smoker    Quit date: 08/03/2004  . Smokeless tobacco: Never Used  . Alcohol Use: 2.5 oz/week    5 drink(s) per week  . Drug Use: No  . Sexual Activity: Not on file   Other Topics Concern  . Not on file   Social History Narrative    Objective:  Filed Vitals:   01/08/15 1008  BP: 140/86  Pulse: 67  Temp: 98 F (36.7 C)  Resp: 14     General appearance: alert, cooperative and appears stated age Ears: normal TM's and external ear canals both ears Throat: lips, mucosa, and tongue normal; teeth and gums normal Neck: no adenopathy, no carotid bruit, supple, symmetrical, trachea midline and thyroid not enlarged, symmetric, no tenderness/mass/nodules Back: symmetric, no curvature. ROM normal. No CVA tenderness. Lungs: clear to auscultation bilaterally Heart: regular rate and rhythm, S1, S2 normal, no murmur, click, rub or gallop Abdomen: soft, non-tender; bowel sounds normal; no masses,  no  organomegaly Pulses: 2+ and symmetric Skin: Skin color, texture, turgor normal. No rashes or lesions Lymph nodes: Cervical, supraclavicular, and axillary nodes normal.  Assessment and Plan:  Dyslipidemia with elevated low density lipoprotein cholesterol and abnormally low high density lipoprotein cholesterol LDL and triglycerides are at goal on current medications. He has no side effects and liver enzymes are normal. No changes today  Lab Results  Component Value Date   CHOL 132 01/08/2015   HDL 35.50* 01/08/2015   LDLCALC 78 01/08/2015   TRIG 95.0 01/08/2015   CHOLHDL 4 01/08/2015    Lab Results  Component Value Date   ALT 16 01/08/2015   AST 18 01/08/2015   ALKPHOS 69 01/08/2015   BILITOT 1.3* 01/08/2015          Hypertension BP 140/86 mmHg  Pulse 67  Temp(Src) 98 F (36.7 C) (Oral)  Resp 14  Ht 5\' 8"  (1.727 m)  Wt 209 lb (94.802 kg)  BMI 31.79 kg/m2  SpO2 95%  Slight loss of control on current regimen due to pain .  Adding hctz to losartan . [name] Renal function stable,   Lab Results  Component Value Date   CREATININE 1.07 01/08/2015   Lab Results  Component Value Date   NA 138 01/08/2015   K 4.4 01/08/2015   CL 104 01/08/2015   CO2 29 01/08/2015      Testicular mass Appreciated on exam of left testicle today.  Ultrasound ordered.    Lumbago His back pain is flaring and may be contributing to his left testicular pain  Prednisone taper.    Obesity (BMI 30-39.9) Wt Readings from Last 3 Encounters:  01/08/15 209 lb (94.802 kg)  07/10/14 205 lb 8 oz (93.214 kg)  02/20/14 217 lb 12 oz (98.771 kg)   Body mass index is 31.79 kg/(m^2).  I have addressed  BMI and recommended wt loss of 10% of body weigh over the next 6 months using a low glycemic index diet and regular exercise a minimum of 5 days per week.       Updated Medication List Outpatient Encounter Prescriptions as of 01/08/2015  Medication Sig  . aspirin 81 MG tablet Take 81 mg by  mouth daily.    . Cholecalciferol (VITAMIN D-3 PO) Take 1 tablet by mouth daily.  . citalopram (CELEXA) 20 MG tablet TAKE 1 TABLET BY MOUTH EVERY DAY  . losartan-hydrochlorothiazide (HYZAAR) 50-12.5 MG per tablet Take 1 tablet by mouth daily.  . predniSONE (DELTASONE) 10 MG tablet 6 tablets on Day 1 , then reduce by 1 tablet daily until gone  . [DISCONTINUED] ergocalciferol (DRISDOL) 50000 UNITS capsule Take 1 capsule (50,000 Units total) by mouth once a week. For 3 months  . [DISCONTINUED] furosemide (LASIX) 20 MG tablet TAKE 1 TABLET BY MOUTH EVERY DAY  . [DISCONTINUED] losartan (COZAAR) 50 MG tablet TAKE 1 TABLET BY MOUTH EVERY DAY  . [DISCONTINUED] potassium chloride (K-DUR,KLOR-CON) 10 MEQ tablet Take  2 tablets (20 mEq total) by mouth daily.   No facility-administered encounter medications on file as of 01/08/2015.     Orders Placed This Encounter  Procedures  . US Scrotum  . Comprehensive metabolic panel  . Vit D  25 hydroxy (rtn osteoporosis monitoring)  . Lipid panel    Return in about 6 months (around 07/11/2015).

## 2015-01-10 ENCOUNTER — Encounter: Payer: Self-pay | Admitting: Internal Medicine

## 2015-01-10 DIAGNOSIS — N5089 Other specified disorders of the male genital organs: Secondary | ICD-10-CM | POA: Insufficient documentation

## 2015-01-10 NOTE — Assessment & Plan Note (Signed)
LDL and triglycerides are at goal on current medications. He has no side effects and liver enzymes are normal. No changes today  Lab Results  Component Value Date   CHOL 132 01/08/2015   HDL 35.50* 01/08/2015   LDLCALC 78 01/08/2015   TRIG 95.0 01/08/2015   CHOLHDL 4 01/08/2015    Lab Results  Component Value Date   ALT 16 01/08/2015   AST 18 01/08/2015   ALKPHOS 69 01/08/2015   BILITOT 1.3* 01/08/2015

## 2015-01-10 NOTE — Assessment & Plan Note (Signed)
Appreciated on exam of left testicle today.  Ultrasound ordered.

## 2015-01-10 NOTE — Assessment & Plan Note (Signed)
His back pain is flaring and may be contributing to his left testicular pain  Prednisone taper.

## 2015-01-10 NOTE — Assessment & Plan Note (Signed)
Wt Readings from Last 3 Encounters:  01/08/15 209 lb (94.802 kg)  07/10/14 205 lb 8 oz (93.214 kg)  02/20/14 217 lb 12 oz (98.771 kg)   Body mass index is 31.79 kg/(m^2).  I have addressed  BMI and recommended wt loss of 10% of body weigh over the next 6 months using a low glycemic index diet and regular exercise a minimum of 5 days per week.

## 2015-01-10 NOTE — Assessment & Plan Note (Addendum)
BP 140/86 mmHg  Pulse 67  Temp(Src) 98 F (36.7 C) (Oral)  Resp 14  Ht 5\' 8"  (1.727 m)  Wt 209 lb (94.802 kg)  BMI 31.79 kg/m2  SpO2 95%  Slight loss of control on current regimen due to pain .  Adding hctz to losartan . [name] Renal function stable,   Lab Results  Component Value Date   CREATININE 1.07 01/08/2015   Lab Results  Component Value Date   NA 138 01/08/2015   K 4.4 01/08/2015   CL 104 01/08/2015   CO2 29 01/08/2015

## 2015-01-11 MED ORDER — ERGOCALCIFEROL 1.25 MG (50000 UT) PO CAPS: 50000.0000 [IU] | ORAL_CAPSULE | ORAL | Status: DC

## 2015-01-11 NOTE — Addendum Note (Signed)
Addended by: [name] on: 01/11/2015 09:52 PM   Modules accepted: Orders

## 2015-01-12 ENCOUNTER — Encounter: Payer: Self-pay | Admitting: *Deleted

## 2015-01-15 ENCOUNTER — Other Ambulatory Visit: Payer: Self-pay | Admitting: Internal Medicine

## 2015-01-15 DIAGNOSIS — N452 Orchitis: Secondary | ICD-10-CM

## 2015-01-16 ENCOUNTER — Ambulatory Visit: Admission: RE | Admit: 2015-01-16 | Payer: Medicare Other | Source: Ambulatory Visit

## 2015-01-26 DIAGNOSIS — D229 Melanocytic nevi, unspecified: Secondary | ICD-10-CM | POA: Diagnosis not present

## 2015-01-26 DIAGNOSIS — L578 Other skin changes due to chronic exposure to nonionizing radiation: Secondary | ICD-10-CM | POA: Diagnosis not present

## 2015-01-26 DIAGNOSIS — D485 Neoplasm of uncertain behavior of skin: Secondary | ICD-10-CM | POA: Diagnosis not present

## 2015-01-26 DIAGNOSIS — L821 Other seborrheic keratosis: Secondary | ICD-10-CM | POA: Diagnosis not present

## 2015-01-26 DIAGNOSIS — B079 Viral wart, unspecified: Secondary | ICD-10-CM | POA: Diagnosis not present

## 2015-01-26 DIAGNOSIS — Z85828 Personal history of other malignant neoplasm of skin: Secondary | ICD-10-CM | POA: Diagnosis not present

## 2015-01-26 DIAGNOSIS — D225 Melanocytic nevi of trunk: Secondary | ICD-10-CM | POA: Diagnosis not present

## 2015-01-26 DIAGNOSIS — L814 Other melanin hyperpigmentation: Secondary | ICD-10-CM | POA: Diagnosis not present

## 2015-01-26 DIAGNOSIS — B359 Dermatophytosis, unspecified: Secondary | ICD-10-CM | POA: Diagnosis not present

## 2015-01-26 DIAGNOSIS — L570 Actinic keratosis: Secondary | ICD-10-CM | POA: Diagnosis not present

## 2015-01-26 DIAGNOSIS — D1800 Hemangioma unspecified site: Secondary | ICD-10-CM | POA: Diagnosis not present

## 2015-01-26 DIAGNOSIS — L859 Epidermal thickening, unspecified: Secondary | ICD-10-CM | POA: Diagnosis not present

## 2015-01-26 DIAGNOSIS — Z1283 Encounter for screening for malignant neoplasm of skin: Secondary | ICD-10-CM | POA: Diagnosis not present

## 2015-02-11 DIAGNOSIS — M9902 Segmental and somatic dysfunction of thoracic region: Secondary | ICD-10-CM | POA: Diagnosis not present

## 2015-02-11 DIAGNOSIS — M9903 Segmental and somatic dysfunction of lumbar region: Secondary | ICD-10-CM | POA: Diagnosis not present

## 2015-02-11 DIAGNOSIS — G540 Brachial plexus disorders: Secondary | ICD-10-CM | POA: Diagnosis not present

## 2015-02-11 DIAGNOSIS — M9901 Segmental and somatic dysfunction of cervical region: Secondary | ICD-10-CM | POA: Diagnosis not present

## 2015-02-11 DIAGNOSIS — M6080 Other myositis, unspecified site: Secondary | ICD-10-CM | POA: Diagnosis not present

## 2015-02-11 DIAGNOSIS — M531 Cervicobrachial syndrome: Secondary | ICD-10-CM | POA: Diagnosis not present

## 2015-02-12 DIAGNOSIS — G540 Brachial plexus disorders: Secondary | ICD-10-CM | POA: Diagnosis not present

## 2015-02-12 DIAGNOSIS — M531 Cervicobrachial syndrome: Secondary | ICD-10-CM | POA: Diagnosis not present

## 2015-02-12 DIAGNOSIS — M9901 Segmental and somatic dysfunction of cervical region: Secondary | ICD-10-CM | POA: Diagnosis not present

## 2015-02-12 DIAGNOSIS — M9903 Segmental and somatic dysfunction of lumbar region: Secondary | ICD-10-CM | POA: Diagnosis not present

## 2015-02-12 DIAGNOSIS — M6080 Other myositis, unspecified site: Secondary | ICD-10-CM | POA: Diagnosis not present

## 2015-02-12 DIAGNOSIS — M9902 Segmental and somatic dysfunction of thoracic region: Secondary | ICD-10-CM | POA: Diagnosis not present

## 2015-02-16 DIAGNOSIS — M9901 Segmental and somatic dysfunction of cervical region: Secondary | ICD-10-CM | POA: Diagnosis not present

## 2015-02-16 DIAGNOSIS — M531 Cervicobrachial syndrome: Secondary | ICD-10-CM | POA: Diagnosis not present

## 2015-02-16 DIAGNOSIS — M9903 Segmental and somatic dysfunction of lumbar region: Secondary | ICD-10-CM | POA: Diagnosis not present

## 2015-02-16 DIAGNOSIS — G540 Brachial plexus disorders: Secondary | ICD-10-CM | POA: Diagnosis not present

## 2015-02-16 DIAGNOSIS — M6080 Other myositis, unspecified site: Secondary | ICD-10-CM | POA: Diagnosis not present

## 2015-02-16 DIAGNOSIS — M9902 Segmental and somatic dysfunction of thoracic region: Secondary | ICD-10-CM | POA: Diagnosis not present

## 2015-02-18 DIAGNOSIS — M6080 Other myositis, unspecified site: Secondary | ICD-10-CM | POA: Diagnosis not present

## 2015-02-18 DIAGNOSIS — M9901 Segmental and somatic dysfunction of cervical region: Secondary | ICD-10-CM | POA: Diagnosis not present

## 2015-02-18 DIAGNOSIS — M9902 Segmental and somatic dysfunction of thoracic region: Secondary | ICD-10-CM | POA: Diagnosis not present

## 2015-02-18 DIAGNOSIS — M9903 Segmental and somatic dysfunction of lumbar region: Secondary | ICD-10-CM | POA: Diagnosis not present

## 2015-02-18 DIAGNOSIS — G540 Brachial plexus disorders: Secondary | ICD-10-CM | POA: Diagnosis not present

## 2015-02-18 DIAGNOSIS — M531 Cervicobrachial syndrome: Secondary | ICD-10-CM | POA: Diagnosis not present

## 2015-02-19 DIAGNOSIS — M9902 Segmental and somatic dysfunction of thoracic region: Secondary | ICD-10-CM | POA: Diagnosis not present

## 2015-02-19 DIAGNOSIS — M9903 Segmental and somatic dysfunction of lumbar region: Secondary | ICD-10-CM | POA: Diagnosis not present

## 2015-02-19 DIAGNOSIS — M9901 Segmental and somatic dysfunction of cervical region: Secondary | ICD-10-CM | POA: Diagnosis not present

## 2015-02-19 DIAGNOSIS — M6080 Other myositis, unspecified site: Secondary | ICD-10-CM | POA: Diagnosis not present

## 2015-02-19 DIAGNOSIS — M531 Cervicobrachial syndrome: Secondary | ICD-10-CM | POA: Diagnosis not present

## 2015-02-19 DIAGNOSIS — G540 Brachial plexus disorders: Secondary | ICD-10-CM | POA: Diagnosis not present

## 2015-02-23 DIAGNOSIS — M6080 Other myositis, unspecified site: Secondary | ICD-10-CM | POA: Diagnosis not present

## 2015-02-23 DIAGNOSIS — M9901 Segmental and somatic dysfunction of cervical region: Secondary | ICD-10-CM | POA: Diagnosis not present

## 2015-02-23 DIAGNOSIS — G540 Brachial plexus disorders: Secondary | ICD-10-CM | POA: Diagnosis not present

## 2015-02-23 DIAGNOSIS — M9903 Segmental and somatic dysfunction of lumbar region: Secondary | ICD-10-CM | POA: Diagnosis not present

## 2015-02-23 DIAGNOSIS — M9902 Segmental and somatic dysfunction of thoracic region: Secondary | ICD-10-CM | POA: Diagnosis not present

## 2015-02-23 DIAGNOSIS — M531 Cervicobrachial syndrome: Secondary | ICD-10-CM | POA: Diagnosis not present

## 2015-02-26 DIAGNOSIS — M9901 Segmental and somatic dysfunction of cervical region: Secondary | ICD-10-CM | POA: Diagnosis not present

## 2015-02-26 DIAGNOSIS — M9902 Segmental and somatic dysfunction of thoracic region: Secondary | ICD-10-CM | POA: Diagnosis not present

## 2015-02-26 DIAGNOSIS — M9903 Segmental and somatic dysfunction of lumbar region: Secondary | ICD-10-CM | POA: Diagnosis not present

## 2015-02-26 DIAGNOSIS — M531 Cervicobrachial syndrome: Secondary | ICD-10-CM | POA: Diagnosis not present

## 2015-02-26 DIAGNOSIS — G540 Brachial plexus disorders: Secondary | ICD-10-CM | POA: Diagnosis not present

## 2015-02-26 DIAGNOSIS — M6080 Other myositis, unspecified site: Secondary | ICD-10-CM | POA: Diagnosis not present

## 2015-03-03 DIAGNOSIS — M9901 Segmental and somatic dysfunction of cervical region: Secondary | ICD-10-CM | POA: Diagnosis not present

## 2015-03-03 DIAGNOSIS — M9903 Segmental and somatic dysfunction of lumbar region: Secondary | ICD-10-CM | POA: Diagnosis not present

## 2015-03-03 DIAGNOSIS — M9902 Segmental and somatic dysfunction of thoracic region: Secondary | ICD-10-CM | POA: Diagnosis not present

## 2015-03-03 DIAGNOSIS — M6080 Other myositis, unspecified site: Secondary | ICD-10-CM | POA: Diagnosis not present

## 2015-03-03 DIAGNOSIS — G540 Brachial plexus disorders: Secondary | ICD-10-CM | POA: Diagnosis not present

## 2015-03-03 DIAGNOSIS — M531 Cervicobrachial syndrome: Secondary | ICD-10-CM | POA: Diagnosis not present

## 2015-03-10 DIAGNOSIS — M6080 Other myositis, unspecified site: Secondary | ICD-10-CM | POA: Diagnosis not present

## 2015-03-10 DIAGNOSIS — M9903 Segmental and somatic dysfunction of lumbar region: Secondary | ICD-10-CM | POA: Diagnosis not present

## 2015-03-10 DIAGNOSIS — G540 Brachial plexus disorders: Secondary | ICD-10-CM | POA: Diagnosis not present

## 2015-03-10 DIAGNOSIS — M9902 Segmental and somatic dysfunction of thoracic region: Secondary | ICD-10-CM | POA: Diagnosis not present

## 2015-03-10 DIAGNOSIS — M531 Cervicobrachial syndrome: Secondary | ICD-10-CM | POA: Diagnosis not present

## 2015-03-10 DIAGNOSIS — M9901 Segmental and somatic dysfunction of cervical region: Secondary | ICD-10-CM | POA: Diagnosis not present

## 2015-03-12 DIAGNOSIS — M531 Cervicobrachial syndrome: Secondary | ICD-10-CM | POA: Diagnosis not present

## 2015-03-12 DIAGNOSIS — G540 Brachial plexus disorders: Secondary | ICD-10-CM | POA: Diagnosis not present

## 2015-03-12 DIAGNOSIS — M9901 Segmental and somatic dysfunction of cervical region: Secondary | ICD-10-CM | POA: Diagnosis not present

## 2015-03-12 DIAGNOSIS — M9903 Segmental and somatic dysfunction of lumbar region: Secondary | ICD-10-CM | POA: Diagnosis not present

## 2015-03-12 DIAGNOSIS — M6080 Other myositis, unspecified site: Secondary | ICD-10-CM | POA: Diagnosis not present

## 2015-03-12 DIAGNOSIS — M9902 Segmental and somatic dysfunction of thoracic region: Secondary | ICD-10-CM | POA: Diagnosis not present

## 2015-03-16 DIAGNOSIS — G540 Brachial plexus disorders: Secondary | ICD-10-CM | POA: Diagnosis not present

## 2015-03-16 DIAGNOSIS — M9902 Segmental and somatic dysfunction of thoracic region: Secondary | ICD-10-CM | POA: Diagnosis not present

## 2015-03-16 DIAGNOSIS — M531 Cervicobrachial syndrome: Secondary | ICD-10-CM | POA: Diagnosis not present

## 2015-03-16 DIAGNOSIS — M9901 Segmental and somatic dysfunction of cervical region: Secondary | ICD-10-CM | POA: Diagnosis not present

## 2015-03-16 DIAGNOSIS — M9903 Segmental and somatic dysfunction of lumbar region: Secondary | ICD-10-CM | POA: Diagnosis not present

## 2015-03-16 DIAGNOSIS — M6080 Other myositis, unspecified site: Secondary | ICD-10-CM | POA: Diagnosis not present

## 2015-03-19 DIAGNOSIS — M9902 Segmental and somatic dysfunction of thoracic region: Secondary | ICD-10-CM | POA: Diagnosis not present

## 2015-03-19 DIAGNOSIS — M6080 Other myositis, unspecified site: Secondary | ICD-10-CM | POA: Diagnosis not present

## 2015-03-19 DIAGNOSIS — M9903 Segmental and somatic dysfunction of lumbar region: Secondary | ICD-10-CM | POA: Diagnosis not present

## 2015-03-19 DIAGNOSIS — G540 Brachial plexus disorders: Secondary | ICD-10-CM | POA: Diagnosis not present

## 2015-03-19 DIAGNOSIS — M531 Cervicobrachial syndrome: Secondary | ICD-10-CM | POA: Diagnosis not present

## 2015-03-19 DIAGNOSIS — M9901 Segmental and somatic dysfunction of cervical region: Secondary | ICD-10-CM | POA: Diagnosis not present

## 2015-04-02 DIAGNOSIS — G540 Brachial plexus disorders: Secondary | ICD-10-CM | POA: Diagnosis not present

## 2015-04-02 DIAGNOSIS — M9903 Segmental and somatic dysfunction of lumbar region: Secondary | ICD-10-CM | POA: Diagnosis not present

## 2015-04-02 DIAGNOSIS — M6080 Other myositis, unspecified site: Secondary | ICD-10-CM | POA: Diagnosis not present

## 2015-04-02 DIAGNOSIS — M531 Cervicobrachial syndrome: Secondary | ICD-10-CM | POA: Diagnosis not present

## 2015-04-02 DIAGNOSIS — M9902 Segmental and somatic dysfunction of thoracic region: Secondary | ICD-10-CM | POA: Diagnosis not present

## 2015-04-02 DIAGNOSIS — M9901 Segmental and somatic dysfunction of cervical region: Secondary | ICD-10-CM | POA: Diagnosis not present

## 2015-04-16 DIAGNOSIS — G540 Brachial plexus disorders: Secondary | ICD-10-CM | POA: Diagnosis not present

## 2015-04-16 DIAGNOSIS — M9901 Segmental and somatic dysfunction of cervical region: Secondary | ICD-10-CM | POA: Diagnosis not present

## 2015-04-16 DIAGNOSIS — M531 Cervicobrachial syndrome: Secondary | ICD-10-CM | POA: Diagnosis not present

## 2015-04-16 DIAGNOSIS — M6080 Other myositis, unspecified site: Secondary | ICD-10-CM | POA: Diagnosis not present

## 2015-04-16 DIAGNOSIS — M9903 Segmental and somatic dysfunction of lumbar region: Secondary | ICD-10-CM | POA: Diagnosis not present

## 2015-04-16 DIAGNOSIS — M9902 Segmental and somatic dysfunction of thoracic region: Secondary | ICD-10-CM | POA: Diagnosis not present

## 2015-05-07 DIAGNOSIS — M9902 Segmental and somatic dysfunction of thoracic region: Secondary | ICD-10-CM | POA: Diagnosis not present

## 2015-05-07 DIAGNOSIS — G540 Brachial plexus disorders: Secondary | ICD-10-CM | POA: Diagnosis not present

## 2015-05-07 DIAGNOSIS — M9901 Segmental and somatic dysfunction of cervical region: Secondary | ICD-10-CM | POA: Diagnosis not present

## 2015-05-07 DIAGNOSIS — M9903 Segmental and somatic dysfunction of lumbar region: Secondary | ICD-10-CM | POA: Diagnosis not present

## 2015-05-07 DIAGNOSIS — M531 Cervicobrachial syndrome: Secondary | ICD-10-CM | POA: Diagnosis not present

## 2015-05-07 DIAGNOSIS — M6080 Other myositis, unspecified site: Secondary | ICD-10-CM | POA: Diagnosis not present

## 2015-05-19 ENCOUNTER — Other Ambulatory Visit: Payer: Self-pay | Admitting: Internal Medicine

## 2015-06-20 DIAGNOSIS — Z23 Encounter for immunization: Secondary | ICD-10-CM | POA: Diagnosis not present

## 2015-07-16 ENCOUNTER — Telehealth: Payer: Self-pay | Admitting: Internal Medicine

## 2015-07-16 ENCOUNTER — Ambulatory Visit (INDEPENDENT_AMBULATORY_CARE_PROVIDER_SITE_OTHER): Payer: Medicare Other | Admitting: Internal Medicine

## 2015-07-16 ENCOUNTER — Encounter: Payer: Self-pay | Admitting: Internal Medicine

## 2015-07-16 VITALS — BP 138/78 | HR 66 | Temp 97.9°F | Ht 68.0 in | Wt 215.8 lb

## 2015-07-16 DIAGNOSIS — E784 Other hyperlipidemia: Secondary | ICD-10-CM

## 2015-07-16 DIAGNOSIS — E669 Obesity, unspecified: Secondary | ICD-10-CM | POA: Diagnosis not present

## 2015-07-16 DIAGNOSIS — E559 Vitamin D deficiency, unspecified: Secondary | ICD-10-CM | POA: Diagnosis not present

## 2015-07-16 DIAGNOSIS — Z113 Encounter for screening for infections with a predominantly sexual mode of transmission: Secondary | ICD-10-CM

## 2015-07-16 DIAGNOSIS — R7301 Impaired fasting glucose: Secondary | ICD-10-CM | POA: Diagnosis not present

## 2015-07-16 DIAGNOSIS — Z125 Encounter for screening for malignant neoplasm of prostate: Secondary | ICD-10-CM | POA: Diagnosis not present

## 2015-07-16 DIAGNOSIS — I10 Essential (primary) hypertension: Secondary | ICD-10-CM | POA: Diagnosis not present

## 2015-07-16 DIAGNOSIS — Z0000 Encounter for general adult medical examination without abnormal findings: Secondary | ICD-10-CM | POA: Diagnosis not present

## 2015-07-16 DIAGNOSIS — E785 Hyperlipidemia, unspecified: Secondary | ICD-10-CM

## 2015-07-16 LAB — COMPREHENSIVE METABOLIC PANEL
ALBUMIN: 4.3 g/dL (ref 3.5–5.2)
ALT: 17 U/L (ref 0–53)
AST: 17 U/L (ref 0–37)
Alkaline Phosphatase: 63 U/L (ref 39–117)
BILIRUBIN TOTAL: 1.1 mg/dL (ref 0.2–1.2)
BUN: 16 mg/dL (ref 6–23)
CALCIUM: 9.9 mg/dL (ref 8.4–10.5)
CO2: 28 meq/L (ref 19–32)
CREATININE: 1.07 mg/dL (ref 0.40–1.50)
Chloride: 104 mEq/L (ref 96–112)
GFR: 72.84 mL/min (ref >60.00)
Glucose, Bld: 101 mg/dL — ABNORMAL HIGH (ref 70–99)
Potassium: 4.5 mEq/L (ref 3.5–5.1)
Sodium: 139 mEq/L (ref 135–145)
Total Protein: 6.6 g/dL (ref 6.0–8.3)

## 2015-07-16 LAB — PSA, MEDICARE: PSA: 0.92 ng/ml (ref 0.10–4.00)

## 2015-07-16 LAB — LIPID PANEL
Cholesterol: 138 mg/dL (ref 0–200)
HDL: 32.4 mg/dL — AB (ref >39.00)
LDL Cholesterol: 88 mg/dL (ref 0–99)
NONHDL: 105.12
Total CHOL/HDL Ratio: 4
Triglycerides: 84 mg/dL (ref 0.0–149.0)
VLDL: 16.8 mg/dL (ref 0.0–40.0)

## 2015-07-16 LAB — HEMOGLOBIN A1C: HEMOGLOBIN A1C: 5.7 % (ref 4.6–6.5)

## 2015-07-16 LAB — VITAMIN D 25 HYDROXY (VIT D DEFICIENCY, FRACTURES): VITD: 19.75 ng/mL — ABNORMAL LOW (ref 30.00–100.00)

## 2015-07-16 NOTE — Progress Notes (Signed)
Pre visit review using our clinic review tool, if applicable. No additional management support is needed unless otherwise documented below in the visit note. 

## 2015-07-16 NOTE — Patient Instructions (Signed)

## 2015-07-16 NOTE — Telephone Encounter (Signed)
Your PSA, cholesterol,  liver and kidney function are normal.  You have no signs of pre diabetes.    However, Your vitamin D is low, which can increase your risk of weak bones and fractures and interfere with your body's ability to absorb the calcium in your diet.   I am calling in a megadose of Vit D to take once weekly for a total of 3 months,  Then after you finish the weekly supplement, you should start taking an OTC  Vit D3 supplement 1000 units daily.   Regards,   Dr. Derrel Nip

## 2015-07-16 NOTE — Progress Notes (Signed)
Patient ID: Austin Duran, male    DOB: 29-Oct-1945  Age: 69 y.o. MRN: [id number]  The patient is here for annual preventive  examination and management of other chronic and acute problems.   The risk factors are reflected in the social history.  The roster of all physicians providing medical care to patient - is listed in the Snapshot section of the chart.  Activities of daily living:  The patient is 100% independent in all ADLs: dressing, toileting, feeding as well as independent mobility  Home safety : The patient has smoke detectors in the home. They wear seatbelts.  There are no firearms at home. There is no violence in the home.   There is no risks for hepatitis, STDs or HIV. There is no   history of blood transfusion. They have no travel history to infectious disease endemic areas of the world.  The patient has seen their dentist in the last six month. They have seen their eye doctor in the last year. They admit to slight hearing difficulty with regard to whispered voices and some television programs.  They have deferred audiologic testing in the last year.  They do not  have excessive sun exposure. Discussed the need for sun protection: hats, long sleeves and use of sunscreen if there is significant sun exposure.   Diet: the importance of a healthy diet is discussed. They do have a healthy diet.  The benefits of regular aerobic exercise were discussed. She walks 4 times per week ,  20 minutes.   Depression screen: there are no signs or vegative symptoms of depression- irritability, change in appetite, anhedonia, sadness/tearfullness.  Cognitive assessment: the patient manages all their financial and personal affairs and is actively engaged. They could relate day,date,year and events; recalled 2/3 objects at 3 minutes; performed clock-face test normally.  The following portions of the patient's history were reviewed and updated as appropriate: allergies, current medications, past  family history, past medical history,  past surgical history, past social history  and problem list.  Visual acuity was not assessed per patient preference since she has regular follow up with her ophthalmologist. Hearing and body mass index were assessed and reviewed.   During the course of the visit the patient was educated and counseled about appropriate screening and preventive services including : fall prevention , diabetes screening, nutrition counseling, colorectal cancer screening, and recommended immunizations.    CC: The primary encounter diagnosis was Impaired fasting glucose. Diagnoses of Prostate cancer screening, Obesity, Essential hypertension, Vitamin D deficiency, Screen for STD (sexually transmitted disease), Encounter for preventive health examination, Dyslipidemia with elevated low density lipoprotein cholesterol and abnormally low high density lipoprotein cholesterol, and Obesity (BMI 30-39.9) were also pertinent to this visit.  History Haruki has a past medical history of Hearing loss; Panic attack; Hypertension; Depression; and Panic attacks.   He has past surgical history that includes Tympanic membrane repair; Fracture surgery; Cataract extraction, bilateral (May 2013); Eye surgery (2008); and Cataract extraction (Bilateral, Aug 2013).   His family history includes Cancer in his maternal aunt and paternal uncle.He reports that he quit smoking about 10 years ago. He has never used smokeless tobacco. He reports that he drinks about 2.5 oz of alcohol per week. He reports that he does not use illicit drugs.  Outpatient Prescriptions Prior to Visit  Medication Sig Dispense Refill  . aspirin 81 MG tablet Take 81 mg by mouth daily.      . Cholecalciferol (VITAMIN D-3 PO) Take 1 tablet  by mouth daily.    . citalopram (CELEXA) 20 MG tablet TAKE 1 TABLET BY MOUTH EVERY DAY 90 tablet 1  . losartan-hydrochlorothiazide (HYZAAR) 50-12.5 MG per tablet Take 1 tablet by mouth daily. 90  tablet 3  . citalopram (CELEXA) 20 MG tablet TAKE 1 TABLET BY MOUTH EVERY DAY 90 tablet 1  . ergocalciferol (DRISDOL) 50000 UNITS capsule Take 1 capsule (50,000 Units total) by mouth once a week. 12 capsule 0  . predniSONE (DELTASONE) 10 MG tablet 6 tablets on Day 1 , then reduce by 1 tablet daily until gone 21 tablet 0   No facility-administered medications prior to visit.    Review of Systems   Patient denies headache, fevers, malaise, unintentional weight loss, skin rash, eye pain, sinus congestion and sinus pain, sore throat, dysphagia,  hemoptysis , cough, dyspnea, wheezing, chest pain, palpitations, orthopnea, edema, abdominal pain, nausea, melena, diarrhea, constipation, flank pain, dysuria, hematuria, urinary  Frequency, nocturia, numbness, tingling, seizures,  Focal weakness, Loss of consciousness,  Tremor, insomnia, depression, anxiety, and suicidal ideation.      Objective:  BP 138/78 mmHg  Pulse 66  Temp(Src) 97.9 F (36.6 C) (Oral)  Ht 5\' 8"  (1.727 m)  Wt 215 lb 12.8 oz (97.886 kg)  BMI 32.82 kg/m2  SpO2 96%  Physical Exam   General appearance: alert, cooperative and appears stated age Ears: normal TM's and external ear canals both ears Throat: lips, mucosa, and tongue normal; teeth and gums normal Neck: no adenopathy, no carotid bruit, supple, symmetrical, trachea midline and thyroid not enlarged, symmetric, no tenderness/mass/nodules Back: symmetric, no curvature. ROM normal. No CVA tenderness. Lungs: clear to auscultation bilaterally Heart: regular rate and rhythm, S1, S2 normal, no murmur, click, rub or gallop Abdomen: soft, non-tender; bowel sounds normal; no masses,  no organomegaly Pulses: 2+ and symmetric Skin: Skin color, texture, turgor normal. No rashes or lesions Lymph nodes: Cervical, supraclavicular, and axillary nodes normal.    Assessment & Plan:   Problem List Items Addressed This Visit    Dyslipidemia with elevated low density lipoprotein  cholesterol and abnormally low high density lipoprotein cholesterol    LDL and triglycerides are at goal on current medications. He has no side effects and liver enzymes are normal. No changes today  Lab Results  Component Value Date   CHOL 138 07/16/2015   HDL 32.40* 07/16/2015   LDLCALC 88 07/16/2015   TRIG 84.0 07/16/2015   CHOLHDL 4 07/16/2015    Lab Results  Component Value Date   ALT 17 07/16/2015   AST 17 07/16/2015   ALKPHOS 63 07/16/2015   BILITOT 1.1 07/16/2015               Obesity (BMI 30-39.9)    I have addressed  BMI and recommended a low glycemic index diet utilizing smaller more frequent meals to increase metabolism.  I have also recommended that patient start exercising with a goal of 30 minutes of aerobic exercise a minimum of 5 days per week. Screening for lipid disorders, thyroid and diabetes to be done today.  Lab Results  Component Value Date   HGBA1C 5.7 07/16/2015   Lab Results  Component Value Date   TSH 0.88 07/10/2014         Hypertension    Well controlled on current regimen. Renal function stable, no changes today.  Lab Results  Component Value Date   CREATININE 1.07 07/16/2015   Lab Results  Component Value Date   NA 139 07/16/2015  K 4.5 07/16/2015   CL 104 07/16/2015   CO2 28 07/16/2015         Relevant Orders   Comprehensive metabolic panel (Completed)   Encounter for preventive health examination    Annual comprehensive preventive exam was done as well as an evaluation and management of chronic conditions .  During the course of the visit the patient was educated and counseled about appropriate screening and preventive services including :  diabetes screening, lipid analysis with projected  10 year  risk for CAD   , nutrition counseling, colorectal and prostate cancer screening, and recommended immunizations.  Printed recommendations for health maintenance screenings was given.       Vitamin D deficiency   Relevant  Orders   Vit D  25 hydroxy (rtn osteoporosis monitoring) (Completed)    Other Visit Diagnoses    Impaired fasting glucose    -  Primary    Relevant Orders    Hemoglobin A1c (Completed)    Prostate cancer screening        Relevant Orders    PSA, Medicare (Completed)    Obesity        Relevant Orders    Lipid panel (Completed)    Screen for STD (sexually transmitted disease)        Relevant Orders    Hepatitis C antibody (Completed)       I have discontinued Mr. Heinig predniSONE and ergocalciferol. I am also having him maintain his aspirin, Cholecalciferol (VITAMIN D-3 PO), losartan-hydrochlorothiazide, and citalopram.  No orders of the defined types were placed in this encounter.    Medications Discontinued During This Encounter  Medication Reason  . ergocalciferol (DRISDOL) 50000 UNITS capsule Duplicate  . predniSONE (DELTASONE) 10 MG tablet Completed Course  . citalopram (CELEXA) 20 MG tablet Duplicate    Follow-up: No Follow-up on file.   [name], MD

## 2015-07-17 LAB — HEPATITIS C ANTIBODY: HCV Ab: NEGATIVE

## 2015-07-17 NOTE — Telephone Encounter (Signed)
Attempted to call patient, unable to leave a message. 

## 2015-07-18 DIAGNOSIS — Z0000 Encounter for general adult medical examination without abnormal findings: Secondary | ICD-10-CM | POA: Insufficient documentation

## 2015-07-18 NOTE — Assessment & Plan Note (Signed)
Well controlled on current regimen. Renal function stable, no changes today.  Lab Results  Component Value Date   CREATININE 1.07 07/16/2015   Lab Results  Component Value Date   NA 139 07/16/2015   K 4.5 07/16/2015   CL 104 07/16/2015   CO2 28 07/16/2015

## 2015-07-18 NOTE — Assessment & Plan Note (Signed)
LDL and triglycerides are at goal on current medications. He has no side effects and liver enzymes are normal. No changes today  Lab Results  Component Value Date   CHOL 138 07/16/2015   HDL 32.40* 07/16/2015   LDLCALC 88 07/16/2015   TRIG 84.0 07/16/2015   CHOLHDL 4 07/16/2015    Lab Results  Component Value Date   ALT 17 07/16/2015   AST 17 07/16/2015   ALKPHOS 63 07/16/2015   BILITOT 1.1 07/16/2015

## 2015-07-18 NOTE — Assessment & Plan Note (Signed)
I have addressed  BMI and recommended a low glycemic index diet utilizing smaller more frequent meals to increase metabolism.  I have also recommended that patient start exercising with a goal of 30 minutes of aerobic exercise a minimum of 5 days per week. Screening for lipid disorders, thyroid and diabetes to be done today.  Lab Results  Component Value Date   HGBA1C 5.7 07/16/2015   Lab Results  Component Value Date   TSH 0.88 07/10/2014

## 2015-07-18 NOTE — Assessment & Plan Note (Signed)
Annual comprehensive preventive exam was done as well as an evaluation and management of chronic conditions .  During the course of the visit the patient was educated and counseled about appropriate screening and preventive services including :  diabetes screening, lipid analysis with projected  10 year  risk for CAD   , nutrition counseling, colorectal and prostate cancer screening, and recommended immunizations.  Printed recommendations for health maintenance screenings was given.

## 2015-07-23 ENCOUNTER — Ambulatory Visit (INDEPENDENT_AMBULATORY_CARE_PROVIDER_SITE_OTHER): Payer: Medicare Other

## 2015-07-23 VITALS — BP 138/76 | HR 86 | Temp 98.0°F | Resp 14 | Ht 67.5 in | Wt 216.4 lb

## 2015-07-23 DIAGNOSIS — Z0000 Encounter for general adult medical examination without abnormal findings: Secondary | ICD-10-CM

## 2015-07-23 NOTE — Progress Notes (Signed)
Subjective:   Austin Duran is a 69 y.o. male who presents for Medicare Annual/Subsequent preventive examination.  Review of Systems:  No ROS.  Medicare Wellness Visit.  Cardiac Risk Factors include: advanced age (>53men, >30 women);male gender     Objective:    Vitals: BP 138/76 mmHg  Pulse 86  Temp(Src) 98 F (36.7 C) (Oral)  Resp 14  Ht 5' 7.5" (1.715 m)  Wt 216 lb 6.4 oz (98.158 kg)  BMI 33.37 kg/m2  SpO2 96%  Tobacco History  Smoking status  . Former Smoker  . Quit date: 08/03/2004  Smokeless tobacco  . Never Used     Counseling given: Not Answered   Past Medical History  Diagnosis Date  . Hearing loss     had a perforated right TM,   followed by Dr. Tami Ribas  . Panic attack   . Hypertension   . Depression   . Panic attacks    Past Surgical History  Procedure Laterality Date  . Tympanic membrane repair      [facility] , right   . Fracture surgery    . Cataract extraction, bilateral  May 2013    [facility]  . Eye surgery  2008    eyelid surgery, Dr. Loletta Specter  . Cataract extraction Bilateral Aug 2013    [facility]   Family History  Problem Relation Age of Onset  . Cancer Maternal Aunt     lung  . Cancer Paternal Uncle     colon   History  Sexual Activity  . Sexual Activity: Yes    Outpatient Encounter Prescriptions as of 07/23/2015  Medication Sig  . aspirin 81 MG tablet Take 81 mg by mouth daily.    . Cholecalciferol (VITAMIN D-3 PO) Take 1 tablet by mouth daily.  . citalopram (CELEXA) 20 MG tablet TAKE 1 TABLET BY MOUTH EVERY DAY  . losartan-hydrochlorothiazide (HYZAAR) 50-12.5 MG per tablet Take 1 tablet by mouth daily.   No facility-administered encounter medications on file as of 07/23/2015.    Activities of Daily Living In your present state of health, do you have any difficulty performing the following activities: 07/23/2015 07/23/2015  Hearing? (No Data) Y  Vision? - N  Difficulty concentrating or making decisions? - N    Walking or climbing stairs? - N  Dressing or bathing? - N  Doing errands, shopping? - N  Conservation officer, nature and eating ? - N  Using the Toilet? - N  In the past six months, have you accidently leaked urine? - N  Do you have problems with loss of bowel control? - N  Managing your Medications? - N  Managing your Finances? - N  Housekeeping or managing your Housekeeping? - N    Patient Care Team: [name], MD as PCP - General (Internal Medicine)   Assessment:    This is a routine wellness examination for [place]. The goal of the wellness visit is to assist the patient how to close the gaps in care and create a preventative care plan for the patient.   Calcium/ VIT D as appropriate/Osteoporosis risk discussed.  Taking meds without issues; no barriers identified.   Safety issues reviewed; smoke detectors in the home. Firearms locked in a secure area. Wears seatbelts when driving or riding with others. No violence in the home.  No identified risk were noted; The patient was oriented x 3; appropriate in dress and manner and no objective failures at ADL's or IADL's.   Patient Concerns:  None at this time.  Follow up with PCP as needed.  Exercise Activities and Dietary recommendations Current Exercise Habits:: Home exercise routine, Type of exercise: walking, Time (Minutes): 20, Intensity: Mild  Goals    . Increase water intake     Currently drinks 2 bottles =4 cups of water daily.  Increase water intake up to 1 bottle daily =2.  Total water consumed 3 bottles=6 cups daily.   Maintain exercise regiment currently in place.         Fall Risk Fall Risk  07/23/2015 07/02/2013 09/07/2012  Falls in the past year? No No No   Depression Screen PHQ 2/9 Scores 07/23/2015 07/02/2013 09/07/2012  PHQ - 2 Score 0 0 0    Cognitive Testing MMSE - Mini Mental State Exam 07/23/2015  Orientation to time 5  Orientation to Place 5  Registration 3  Attention/ Calculation 5  Recall 3   Language- name 2 objects 2  Language- repeat 1  Language- follow 3 step command 3  Language- read & follow direction 1  Write a sentence 1  Copy design 1  Total score 30    Immunization History  Administered Date(s) Administered  . Influenza Split 08/04/2011, 07/04/2012, 06/15/2015  . Influenza, High Dose Seasonal PF 07/02/2013  . Influenza,inj,Quad PF,36+ Mos 07/10/2014  . Pneumococcal Conjugate-13 07/02/2013  . Pneumococcal Polysaccharide-23 07/10/2014  . Pneumococcal-Unspecified 07/10/2009  . Tdap 08/04/2011  . Zoster 07/26/2012   Screening Tests Health Maintenance  Topic Date Due  . INFLUENZA VACCINE  04/05/2016  . COLONOSCOPY  08/03/2021  . TETANUS/TDAP  08/03/2021  . ZOSTAVAX  Completed  . Hepatitis C Screening  Completed  . PNA vac Low Risk Adult  Completed      Plan:    End of life planning was discussed; aging in home or other; Advanced aging; Plans to bring a copy of already completed HCPOA/Living Will forms.  During the course of the visit the patient was educated and counseled about the following appropriate screening and preventive services:   Vaccines to include Pneumoccal, Influenza, Hepatitis B, Td, Zostavax, HCV  Electrocardiogram  Cardiovascular Disease  Colorectal cancer screening  Diabetes screening  Prostate Cancer Screening  Glaucoma screening  Nutrition counseling   Smoking cessation counseling  Patient Instructions (the written plan) was given to the patient.    Varney Biles, LPN  [id number]

## 2015-07-23 NOTE — Patient Instructions (Addendum)
Austin Duran,  Thank you for taking time to come for your Medicare Wellness Visit.  I appreciate your ongoing commitment to your health goals. Please review the following plan we discussed and let me know if I can assist you in the future.  Follow up with PCP as needed  Return in 1 year for Medicare Annual Wellness visit with Health Coach

## 2015-07-23 NOTE — Telephone Encounter (Signed)
Left message for patient to return my call.

## 2015-07-24 NOTE — Progress Notes (Signed)
  I have reviewed the above information and agree with above.   Zilla Shartzer, MD 

## 2015-07-27 ENCOUNTER — Encounter: Payer: Self-pay | Admitting: *Deleted

## 2015-10-08 DIAGNOSIS — M531 Cervicobrachial syndrome: Secondary | ICD-10-CM | POA: Diagnosis not present

## 2015-10-08 DIAGNOSIS — M5134 Other intervertebral disc degeneration, thoracic region: Secondary | ICD-10-CM | POA: Diagnosis not present

## 2015-10-08 DIAGNOSIS — M9902 Segmental and somatic dysfunction of thoracic region: Secondary | ICD-10-CM | POA: Diagnosis not present

## 2015-10-08 DIAGNOSIS — M9901 Segmental and somatic dysfunction of cervical region: Secondary | ICD-10-CM | POA: Diagnosis not present

## 2015-10-16 DIAGNOSIS — H26491 Other secondary cataract, right eye: Secondary | ICD-10-CM | POA: Diagnosis not present

## 2015-10-16 DIAGNOSIS — Z961 Presence of intraocular lens: Secondary | ICD-10-CM | POA: Diagnosis not present

## 2015-11-05 DIAGNOSIS — M531 Cervicobrachial syndrome: Secondary | ICD-10-CM | POA: Diagnosis not present

## 2015-11-05 DIAGNOSIS — M5134 Other intervertebral disc degeneration, thoracic region: Secondary | ICD-10-CM | POA: Diagnosis not present

## 2015-11-05 DIAGNOSIS — M9902 Segmental and somatic dysfunction of thoracic region: Secondary | ICD-10-CM | POA: Diagnosis not present

## 2015-11-05 DIAGNOSIS — M9901 Segmental and somatic dysfunction of cervical region: Secondary | ICD-10-CM | POA: Diagnosis not present

## 2015-11-14 ENCOUNTER — Other Ambulatory Visit: Payer: Self-pay | Admitting: Internal Medicine

## 2015-12-10 DIAGNOSIS — M5134 Other intervertebral disc degeneration, thoracic region: Secondary | ICD-10-CM | POA: Diagnosis not present

## 2015-12-10 DIAGNOSIS — M531 Cervicobrachial syndrome: Secondary | ICD-10-CM | POA: Diagnosis not present

## 2015-12-10 DIAGNOSIS — M9902 Segmental and somatic dysfunction of thoracic region: Secondary | ICD-10-CM | POA: Diagnosis not present

## 2015-12-10 DIAGNOSIS — M9901 Segmental and somatic dysfunction of cervical region: Secondary | ICD-10-CM | POA: Diagnosis not present

## 2016-01-14 DIAGNOSIS — M5134 Other intervertebral disc degeneration, thoracic region: Secondary | ICD-10-CM | POA: Diagnosis not present

## 2016-01-14 DIAGNOSIS — M531 Cervicobrachial syndrome: Secondary | ICD-10-CM | POA: Diagnosis not present

## 2016-01-14 DIAGNOSIS — M9902 Segmental and somatic dysfunction of thoracic region: Secondary | ICD-10-CM | POA: Diagnosis not present

## 2016-01-14 DIAGNOSIS — M9901 Segmental and somatic dysfunction of cervical region: Secondary | ICD-10-CM | POA: Diagnosis not present

## 2016-01-15 ENCOUNTER — Ambulatory Visit
Admission: EM | Admit: 2016-01-15 | Discharge: 2016-01-15 | Disposition: A | Discharge status: Home / Self Care | Payer: Medicare Other | Attending: Internal Medicine | Admitting: Internal Medicine

## 2016-01-15 ENCOUNTER — Encounter: Payer: Self-pay | Admitting: Emergency Medicine

## 2016-01-15 DIAGNOSIS — T148 Other injury of unspecified body region: Secondary | ICD-10-CM | POA: Diagnosis not present

## 2016-01-15 DIAGNOSIS — W57XXXA Bitten or stung by nonvenomous insect and other nonvenomous arthropods, initial encounter: Secondary | ICD-10-CM

## 2016-01-15 MED ORDER — CLINDAMYCIN HCL 300 MG PO CAPS: 300.0000 mg | ORAL_CAPSULE | Freq: Three times a day (TID) | ORAL | Status: DC

## 2016-01-15 NOTE — ED Provider Notes (Signed)
CSN: [id number]     Arrival date & time 01/15/16  1712 History   First MD Initiated Contact with Patient 01/15/16 1804     Chief Complaint  Patient presents with  . Tick Removal   (Consider location/radiation/quality/duration/timing/severity/associated sxs/prior Treatment) HPI History obtained from patient:  Pt presents with the cc of: Tick bite right leg  Duration of symptoms: Tick was removed on Monday redness noted over the last 2 days.  Treatment prior to arrival: Neosporin cream, peroxide Context: Patient was in bath Monday noted a tick behind his right knee. His wife removed a tick. Now he has redness in the area. Patient states the tick was not engorged could not of been on him longer than 12 hours. Other symptoms include: None  Pain score: 0 FAMILY HISTORY: Mother with a history of arthritis, cancer maternal aunt SOCIAL HISTORY: Ex-smoker   Past Medical History  Diagnosis Date  . Hearing loss     had a perforated right TM,   followed by Dr. Tami Ribas  . Panic attack   . Hypertension   . Depression   . Panic attacks    Past Surgical History  Procedure Laterality Date  . Tympanic membrane repair      [facility] , right   . Fracture surgery    . Cataract extraction, bilateral  May 2013    [facility]  . Eye surgery  2008    eyelid surgery, Dr. Loletta Specter  . Cataract extraction Bilateral Aug 2013    [facility]   Family History  Problem Relation Age of Onset  . Cancer Maternal Aunt     lung  . Cancer Paternal Uncle     colon   Social History  Substance Use Topics  . Smoking status: Former Smoker    Quit date: 08/03/2004  . Smokeless tobacco: Never Used  . Alcohol Use: 2.5 oz/week    5 Standard drinks or equivalent per week    Review of Systems ROS +'ve tick bite right popliteal area  Denies: HEADACHE, NAUSEA, ABDOMINAL PAIN, CHEST PAIN, CONGESTION, DYSURIA, SHORTNESS OF BREATH   Allergies  Augmentin and Lisinopril  Home Medications   Prior to Admission  medications   Medication Sig Start Date End Date Taking? Authorizing Provider  aspirin 81 MG tablet Take 81 mg by mouth daily.      Historical Provider, MD  Cholecalciferol (VITAMIN D-3 PO) Take 1 tablet by mouth daily.    Historical Provider, MD  citalopram (CELEXA) 20 MG tablet TAKE 1 TABLET BY MOUTH EVERY DAY 11/16/15   [name], MD  clindamycin (CLEOCIN) 300 MG capsule Take 1 capsule (300 mg total) by mouth 3 (three) times daily. 01/15/16   Konrad Felix, PA  losartan-hydrochlorothiazide (HYZAAR) 50-12.5 MG per tablet Take 1 tablet by mouth daily. 01/08/15   [name], MD   Meds Ordered and Administered this Visit  Medications - No data to display  BP 142/82 mmHg  Pulse 66  Temp(Src) 97.6 F (36.4 C) (Tympanic)  Resp 16  Ht 5\' 9"  (1.753 m)  Wt 210 lb (95.255 kg)  BMI 31.00 kg/m2  SpO2 97% No data found.   Physical Exam NURSES NOTES AND VITAL SIGNS REVIEWED. CONSTITUTIONAL: Well developed, well nourished, no acute distress HEENT: normocephalic, atraumatic EYES: Conjunctiva normal NECK:normal ROM, supple, no adenopathy PULMONARY:No respiratory distress, normal effort ABDOMINAL: Soft, ND, NT BS+, No CVAT MUSCULOSKELETAL: Normal ROM of all extremities, right popliteal there is a red lesion, without tenderness. No lymphangitic streaking. This  is the site of the tick bite. SKIN: warm and dry without rash PSYCHIATRIC: Mood and affect, behavior are normal   ED Course  Procedures (including critical care time)  Labs Review Labs Reviewed - No data to display  Imaging Review No results found.   Visual Acuity Review  Right Eye Distance:   Left Eye Distance:   Bilateral Distance:    Right Eye Near:   Left Eye Near:    Bilateral Near:      rx clindamycin   MDM   1. Tick bite   new condition Total Visit Time: 15 minutes "GREATER THAN 50% WAS SPENT IN COUNSELING AND COORDINATION OF CARE WITH THE PATIENT" DISCUSSION OF LYME DISEASE TRANSMISSION AND WHETHER  PROPHYLAXIS IS APPROPRIATE.  Patient is reassured that there are no issues that require transfer to higher level of care at this time or additional tests. Patient is advised to continue home symptomatic treatment. Patient is advised that if there are new or worsening symptoms to attend the emergency department, contact primary care provider, or return to [facility]. Instructions of care provided discharged home in stable condition.    THIS NOTE WAS GENERATED USING A VOICE RECOGNITION SOFTWARE PROGRAM. ALL REASONABLE EFFORTS  WERE MADE TO PROOFREAD THIS DOCUMENT FOR ACCURACY.  I have verbally reviewed the discharge instructions with the patient. A printed AVS was given to the patient.  All questions were answered prior to discharge.        Konrad Felix, [place] 01/15/16 [place] Mulligan

## 2016-01-15 NOTE — Discharge Instructions (Signed)
Tick Bite Information °Ticks are insects that attach themselves to the skin. There are many types of ticks. Common types include wood ticks and deer ticks. Sometimes, ticks carry diseases that can make a person very ill. The most common places for ticks to attach themselves are the scalp, neck, armpits, waist, and groin.  °HOW CAN YOU PREVENT TICK BITES? °Take these steps to help prevent tick bites when you are outdoors: °· Wear long sleeves and long pants. °· Wear white clothes so you can see ticks more easily. °· Tuck your pant legs into your socks. °· If walking on a trail, stay in the middle of the trail to avoid brushing against bushes. °· Avoid walking through areas with long grass. °· Put bug spray on all skin that is showing and along boot tops, pant legs, and sleeve cuffs. °· Check clothes, hair, and skin often and before going inside. °· Brush off any ticks that are not attached. °· Take a shower or bath as soon as possible after being outdoors. °HOW SHOULD YOU REMOVE A TICK? °Ticks should be removed as soon as possible to help prevent diseases. °1. If latex gloves are available, put them on before trying to remove a tick. °2. Use tweezers to grasp the tick as close to the skin as possible. You may also use curved forceps or a tick removal tool. Grasp the tick as close to its head as possible. Avoid grasping the tick on its body. °3. Pull gently upward until the tick lets go. Do not twist the tick or jerk it suddenly. This may break off the tick's head or mouth parts. °4. Do not squeeze or crush the tick's body. This could force disease-carrying fluids from the tick into your body. °5. After the tick is removed, wash the bite area and your hands with soap and water or alcohol. °6. Apply a small amount of antiseptic cream or ointment to the bite site. °7. Wash any tools that were used. °Do not try to remove a tick by applying a hot match, petroleum jelly, or fingernail polish to the tick. These methods do  not work. They may also increase the chances of disease being spread from the tick bite. °WHEN SHOULD YOU SEEK HELP? °Contact your health care provider if you are unable to remove a tick or if a part of the tick breaks off in the skin. °After a tick bite, you need to watch for signs and symptoms of diseases that can be spread by ticks. Contact your health care provider if you develop any of the following: °· Fever. °· Rash. °· Redness and puffiness (swelling) in the area of the tick bite. °· Tender, puffy lymph glands. °· Watery poop (diarrhea). °· Weight loss. °· Cough. °· Feeling more tired than normal (fatigue). °· Muscle, joint, or bone pain. °· Belly (abdominal) pain. °· Headache. °· Change in your level of consciousness. °· Trouble walking or moving your legs. °· Loss of feeling (numbness) in the legs. °· Loss of movement (paralysis). °· Shortness of breath. °· Confusion. °· Throwing up (vomiting) many times. °  °This information is not intended to replace advice given to you by your health care provider. Make sure you discuss any questions you have with your health care provider. °  °Document Released: 11/16/2009 Document Revised: 04/24/2013 Document Reviewed: 01/30/2013 °Elsevier Interactive Patient Education ©2016 [employer] ° °

## 2016-01-15 NOTE — ED Notes (Signed)
Tick back on back of right leg. Notice on Monday. Wife removed it. It is now itching, red and swollen

## 2016-01-16 ENCOUNTER — Other Ambulatory Visit: Payer: Self-pay | Admitting: Internal Medicine

## 2016-01-27 DIAGNOSIS — L918 Other hypertrophic disorders of the skin: Secondary | ICD-10-CM | POA: Diagnosis not present

## 2016-01-27 DIAGNOSIS — B353 Tinea pedis: Secondary | ICD-10-CM | POA: Diagnosis not present

## 2016-01-27 DIAGNOSIS — D1800 Hemangioma unspecified site: Secondary | ICD-10-CM | POA: Diagnosis not present

## 2016-01-27 DIAGNOSIS — D2271 Melanocytic nevi of right lower limb, including hip: Secondary | ICD-10-CM | POA: Diagnosis not present

## 2016-01-27 DIAGNOSIS — D229 Melanocytic nevi, unspecified: Secondary | ICD-10-CM | POA: Diagnosis not present

## 2016-01-27 DIAGNOSIS — L821 Other seborrheic keratosis: Secondary | ICD-10-CM | POA: Diagnosis not present

## 2016-01-27 DIAGNOSIS — Z85828 Personal history of other malignant neoplasm of skin: Secondary | ICD-10-CM | POA: Diagnosis not present

## 2016-01-27 DIAGNOSIS — R202 Paresthesia of skin: Secondary | ICD-10-CM | POA: Diagnosis not present

## 2016-01-27 DIAGNOSIS — B351 Tinea unguium: Secondary | ICD-10-CM | POA: Diagnosis not present

## 2016-01-27 DIAGNOSIS — L578 Other skin changes due to chronic exposure to nonionizing radiation: Secondary | ICD-10-CM | POA: Diagnosis not present

## 2016-01-27 DIAGNOSIS — Z1283 Encounter for screening for malignant neoplasm of skin: Secondary | ICD-10-CM | POA: Diagnosis not present

## 2016-01-27 DIAGNOSIS — L812 Freckles: Secondary | ICD-10-CM | POA: Diagnosis not present

## 2016-01-27 DIAGNOSIS — D485 Neoplasm of uncertain behavior of skin: Secondary | ICD-10-CM | POA: Diagnosis not present

## 2016-02-15 ENCOUNTER — Encounter: Payer: Self-pay | Admitting: Gynecology

## 2016-02-15 ENCOUNTER — Ambulatory Visit
Admission: EM | Admit: 2016-02-15 | Discharge: 2016-02-15 | Disposition: A | Discharge status: Home / Self Care | Payer: Medicare Other | Attending: Family Medicine | Admitting: Family Medicine

## 2016-02-15 DIAGNOSIS — S61411A Laceration without foreign body of right hand, initial encounter: Secondary | ICD-10-CM

## 2016-02-15 DIAGNOSIS — W293XXA Contact with powered garden and outdoor hand tools and machinery, initial encounter: Secondary | ICD-10-CM

## 2016-02-15 MED ORDER — MUPIROCIN 2 % EX OINT: 1.0000 "application " | TOPICAL_OINTMENT | Freq: Three times a day (TID) | CUTANEOUS | Status: DC

## 2016-02-15 MED ORDER — DOXYCYCLINE HYCLATE 100 MG PO CAPS: 100.0000 mg | ORAL_CAPSULE | Freq: Two times a day (BID) | ORAL | Status: DC

## 2016-02-15 MED ORDER — TETANUS-DIPHTH-ACELL PERTUSSIS 5-2.5-18.5 LF-MCG/0.5 IM SUSP
0.5000 mL | Freq: Once | INTRAMUSCULAR | Status: AC
  Administered 2016-02-15: 0.5 mL via INTRAMUSCULAR

## 2016-02-15 NOTE — ED Notes (Signed)
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 Per patient edge clipper was about to fall and he tried to catch it when injury his right hand.

## 2016-02-15 NOTE — ED Provider Notes (Signed)
CSN: [id number]     Arrival date & time 02/15/16  1737 History   First MD Initiated Contact with Patient 02/15/16 1901     Chief Complaint  Patient presents with  . 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      Past Medical History  Diagnosis Date  . Hearing loss     had a perforated right TM,   followed by Dr. Tami Ribas  . Panic attack   . Hypertension   . Depression   . Panic attacks    Past Surgical History  Procedure Laterality Date  . Tympanic membrane repair      [facility] , right   . Fracture surgery    . Cataract extraction, bilateral  May 2013    [facility]  . Eye surgery  2008    eyelid surgery, Dr. Loletta Specter  . Cataract extraction Bilateral Aug 2013    [facility]   Family History  Problem Relation Age of Onset  . Cancer Maternal Aunt     lung  . Cancer Paternal Uncle     colon   Social History  Substance Use Topics  . Smoking status: Former Smoker    Quit date: 08/03/2004  . Smokeless tobacco: Never Used  . Alcohol Use: 2.5 oz/week    5 Standard drinks or equivalent per week    Review of Systems  Constitutional: Positive for activity change. Negative for fever, chills and fatigue.  Skin: Positive for wound.  All other systems reviewed and are negative.   Allergies  Augmentin and Lisinopril  Home Medications   Prior to Admission medications   Medication Sig Start Date End Date Taking? Authorizing Provider  Cholecalciferol (VITAMIN D-3 PO) Take 1 tablet by mouth daily.   Yes Historical Provider, MD  citalopram (CELEXA) 20 MG tablet TAKE 1 TABLET BY MOUTH EVERY DAY 11/16/15  Yes [name], MD   losartan-hydrochlorothiazide (HYZAAR) 50-12.5 MG tablet TAKE 1 TABLET BY MOUTH DAILY 01/18/16  Yes [name], MD  aspirin 81 MG tablet Take 81 mg by mouth daily.      Historical Provider, MD  clindamycin (CLEOCIN) 300 MG capsule Take 1 capsule (300 mg total) by mouth 3 (three) times daily. 01/15/16   Konrad Felix, PA  doxycycline (VIBRAMYCIN) 100 MG capsule Take 1 capsule (100 mg total) by mouth 2 (two) times daily. 02/15/16   Lorin Picket, PA-C  mupirocin ointment (BACTROBAN) 2 % Apply 1 application topically 3 (three) times daily. 02/15/16   Lorin Picket, PA-C   Meds Ordered and Administered this Visit   Medications  Tdap (BOOSTRIX) injection 0.5 mL (0.5 mLs Intramuscular Given 02/15/16 1845)    BP 146/69 mmHg  Pulse 73  Temp(Src) 97.9 F (36.6 C) (Oral)  Resp 16  Ht 5\' 9"  (1.753 m)  Wt 210 lb (95.255 kg)  BMI 31.00 kg/m2  SpO2 98% No data found.   Physical Exam  Constitutional: He is oriented to person, place, and time. He appears well-developed and well-nourished. No distress.  HENT:  Head: Normocephalic and atraumatic.  Eyes: Conjunctivae are normal. Pupils are equal, round, and reactive to light.  Neck: Normal range of motion. Neck supple.  Musculoskeletal: Normal range of motion. He exhibits tenderness. He exhibits no edema.  Examination of the dominant palm shows a 2.5 cm laceration over the ulnar surface of the palm. Distal flexion crease. Flexor tendons are strong with good pull-through. Neurovascular function is intact distally.  Neurological: He is alert and oriented to person, place, and time.  Skin: Skin is warm and dry. He is not diaphoretic.  Psychiatric: He has a normal mood and affect. His behavior is normal. Judgment and thought content normal.  Nursing note and vitals reviewed.   ED Course  Procedures (including critical care time)  Labs Review Labs Reviewed - No data to display  Imaging Review No results found.   Visual Acuity  Review  Right Eye Distance:   Left Eye Distance:   Bilateral Distance:    Right Eye Near:   Left Eye Near:    Bilateral Near:     Procedures; after explaining to the patient the procedure and his acceptance . the wound was prepped and draped in the usual fashion. The wound was prepped widely with Betadine solution and 1% Xylocaine plain was filtrated around the has wound edges attaining good anesthesia. Wound was then explored with the findings of note tendon damage and no foreign bodies were seen. Copious amounts of sterile saline were utilized with a jet lavage to thoroughly irrigate the wound. The wound was then closed with simple interrupted 4-0 nylon sutures lysing 3 for the main wound and one additional suture on that side laceration that portion of the wound. A total of 4 sutures were utilized. Patient tolerated the procedure well. Triple antibiotic was applied to the wound and the wound covered with sterile compressive dressing. Patient would leave the dressing on for 24 hours and then he may start washing program with application of Bactroban ointment 3 times a day. Will follow him in 2 days for wound recheck leaving the sutures in place for 14 days. Advised him to use a latex glove at work to keep the wound clean. Signs and symptoms of infection were reviewed with the patient in detail and he will return to the clinic immediately or go to emergency department if any of these occur. Because of the dirty hedge trimmer that caused the laceration I will place him on 5 days of doxycycline twice a day.    MDM   1. Laceration of palm without complication, right, initial encounter    Discharge Medication List as of 02/15/2016  7:43 PM    START taking these medications   Details  doxycycline (VIBRAMYCIN) 100 MG capsule Take 1 capsule (100 mg total) by mouth 2 (two) times daily., Starting 02/15/2016, Until Discontinued, Normal    mupirocin ointment (BACTROBAN) 2 % Apply 1 application topically  3 (three) times daily., Starting 02/15/2016, Until Discontinued, Normal      Plan: 1. Test/x-ray results and diagnosis reviewed with patient 2. rx as per orders; risks, benefits, potential side effects reviewed with patient 3. Recommend supportive treatment with Elevation tonight with Profen or Tylenol for pain control. Follow-up in 2 days for wound check and 14 days for suture removal. 4. F/u prn if symptoms worsen or don't improve      Lorin Picket, PA-C 02/15/16 1957

## 2016-02-15 NOTE — Discharge Instructions (Signed)
Laceration Care, Adult A laceration is a cut that goes through all of the layers of the skin and into the tissue that is right under the skin. Some lacerations heal on their own. Others need to be closed with stitches (sutures), staples, skin adhesive strips, or skin glue. Proper laceration care minimizes the risk of infection and helps the laceration to heal better. HOW TO CARE FOR YOUR LACERATION If sutures or staples were used:  Keep the wound clean and dry.  If you were given a bandage (dressing), you should change it at least one time per day or as told by your health care provider. You should also change it if it becomes wet or dirty.  Keep the wound completely dry for the first 24 hours or as told by your health care provider. After that time, you may shower or bathe. However, make sure that the wound is not soaked in water until after the sutures or staples have been removed.  Clean the wound one time each day or as told by your health care provider:  Wash the wound with soap and water.  Rinse the wound with water to remove all soap.  Pat the wound dry with a clean towel. Do not rub the wound.  After cleaning the wound, apply a thin layer of antibiotic ointmentas told by your health care provider. This will help to prevent infection and keep the dressing from sticking to the wound.  Have the sutures or staples removed as told by your health care provider. If skin adhesive strips were used:  Keep the wound clean and dry.  If you were given a bandage (dressing), you should change it at least one time per day or as told by your health care provider. You should also change it if it becomes dirty or wet.  Do not get the skin adhesive strips wet. You may shower or bathe, but be careful to keep the wound dry.  If the wound gets wet, pat it dry with a clean towel. Do not rub the wound.  Skin adhesive strips fall off on their own. You may trim the strips as the wound heals. Do not  remove skin adhesive strips that are still stuck to the wound. They will fall off in time. If skin glue was used:  Try to keep the wound dry, but you may briefly wet it in the shower or bath. Do not soak the wound in water, such as by swimming.  After you have showered or bathed, gently pat the wound dry with a clean towel. Do not rub the wound.  Do not do any activities that will make you sweat heavily until the skin glue has fallen off on its own.  Do not apply liquid, cream, or ointment medicine to the wound while the skin glue is in place. Using those may loosen the film before the wound has healed.  If you were given a bandage (dressing), you should change it at least one time per day or as told by your health care provider. You should also change it if it becomes dirty or wet.  If a dressing is placed over the wound, be careful not to apply tape directly over the skin glue. Doing that may cause the glue to be pulled off before the wound has healed.  Do not pick at the glue. The skin glue usually remains in place for 5-10 days, then it falls off of the skin. General Instructions  Take over-the-counter and prescription  medicines only as told by your health care provider. °· If you were prescribed an antibiotic medicine or ointment, take or apply it as told by your doctor. Do not stop using it even if your condition improves. °· To help prevent scarring, make sure to cover your wound with sunscreen whenever you are outside after stitches are removed, after adhesive strips are removed, or when glue remains in place and the wound is healed. Make sure to wear a sunscreen of at least 30 SPF. °· Do not scratch or pick at the wound. °· Keep all follow-up visits as told by your health care provider. This is important. °· Check your wound every day for signs of infection. Watch for: °· Redness, swelling, or pain. °· Fluid, blood, or pus. °· Raise (elevate) the injured area above the level of your heart  while you are sitting or lying down, if possible. °SEEK MEDICAL CARE IF: °· You received a tetanus shot and you have swelling, severe pain, redness, or bleeding at the injection site. °· You have a fever. °· A wound that was closed breaks open. °· You notice a bad smell coming from your wound or your dressing. °· You notice something coming out of the wound, such as wood or glass. °· Your pain is not controlled with medicine. °· You have increased redness, swelling, or pain at the site of your wound. °· You have fluid, blood, or pus coming from your wound. °· You notice a change in the color of your skin near your wound. °· You need to change the dressing frequently due to fluid, blood, or pus draining from the wound. °· You develop a new rash. °· You develop numbness around the wound. °SEEK IMMEDIATE MEDICAL CARE IF: °· You develop severe swelling around the wound. °· Your pain suddenly increases and is severe. °· You develop painful lumps near the wound or on skin that is anywhere on your body. °· You have a red streak going away from your wound. °· The wound is on your hand or foot and you cannot properly move a finger or toe. °· The wound is on your hand or foot and you notice that your fingers or toes look pale or bluish. °  °This information is not intended to replace advice given to you by your health care provider. Make sure you discuss any questions you have with your health care provider. °  °Document Released: 08/22/2005 Document Revised: 01/06/2015 Document Reviewed: 08/18/2014 °Elsevier Interactive Patient Education ©2016 Elsevier Inc[employer] ° °Stitches, Staples, or Adhesive Wound Closure °Health care providers use stitches (sutures), staples, and certain glue (skin adhesives) to hold skin together while it heals (wound closure). You may need this treatment after you have surgery or if you cut your skin accidentally. These methods help your skin to heal more quickly and make it less likely that you will have  a scar. A wound may take several months to heal completely. °The type of wound you have determines when your wound gets closed. In most cases, the wound is closed as soon as possible (primary skin closure). Sometimes, closure is delayed so the wound can be cleaned and allowed to heal naturally. This reduces the chance of infection. Delayed closure may be needed if your wound: °· Is caused by a bite. °· Happened more than 6 hours ago. °· Involves loss of skin or the tissues under the skin. °· Has dirt or debris in it that cannot be removed. °· Is infected. °WHAT   ARE THE DIFFERENT KINDS OF WOUND CLOSURES? °There are many options for wound closure. The one that your health care provider uses depends on how deep and how large your wound is. °Adhesive Glue °To use this type of glue to close a wound, your health care provider holds the edges of the wound together and paints the glue on the surface of your skin. You may need more than one layer of glue. Then the wound may be covered with a light bandage (dressing). °This type of skin closure may be used for small wounds that are not deep (superficial). Using glue for wound closure is less painful than other methods. It does not require a medicine that numbs the area (local anesthetic). This method also leaves nothing to be removed. Adhesive glue is often used for children and on facial wounds. °Adhesive glue cannot be used for wounds that are deep, uneven, or bleeding. It is not used inside of a wound.  °Adhesive Strips °These strips are made of sticky (adhesive), porous paper. They are applied across your skin edges like a regular adhesive bandage. You leave them on until they fall off. °Adhesive strips may be used to close very superficial wounds. They may also be used along with sutures to improve the closure of your skin edges.  °Sutures °Sutures are the oldest method of wound closure. Sutures can be made from natural substances, such as silk, or from synthetic  materials, such as nylon and steel. They can be made from a material that your body can break down as your wound heals (absorbable), or they can be made from a material that needs to be removed from your skin (nonabsorbable). They come in many different strengths and sizes. °Your health care provider attaches the sutures to a steel needle on one end. Sutures can be passed through your skin, or through the tissues beneath your skin. Then they are tied and cut. Your skin edges may be closed in one continuous stitch or in separate stitches. °Sutures are strong and can be used for all kinds of wounds. Absorbable sutures may be used to close tissues under the skin. The disadvantage of sutures is that they may cause skin reactions that lead to infection. Nonabsorbable sutures need to be removed. °Staples °When surgical staples are used to close a wound, the edges of your skin on both sides of the wound are brought close together. A staple is placed across the wound, and an instrument secures the edges together. Staples are often used to close surgical cuts (incisions). °Staples are faster to use than sutures, and they cause less skin reaction. Staples need to be removed using a tool that bends the staples away from your skin. °HOW DO I CARE FOR MY WOUND CLOSURE? °· Take medicines only as directed by your health care provider. °· If you were prescribed an antibiotic medicine for your wound, finish it all even if you start to feel better. °· Use ointments or creams only as directed by your health care provider. °· Wash your hands with soap and water before and after touching your wound. °· Do not soak your wound in water. Do not take baths, swim, or use a hot tub until your health care provider approves. °· Ask your health care provider when you can start showering. Cover your wound if directed by your health care provider. °· Do not take out your own sutures or staples. °· Do not pick at your wound. Picking can cause an  infection. °·   Keep all follow-up visits as directed by your health care provider. This is important. HOW LONG WILL I HAVE MY WOUND CLOSURE?  Leave adhesive glue on your skin until the glue peels away.  Leave adhesive strips on your skin until the strips fall off.  Absorbable sutures will dissolve within several days.  Nonabsorbable sutures and staples must be removed. The location of the wound will determine how long they stay in. This can range from several days to a couple of weeks. WHEN SHOULD I SEEK HELP FOR MY WOUND CLOSURE? Contact your health care provider if:  You have a fever.  You have chills.  You have drainage, redness, swelling, or pain at your wound.  There is a bad smell coming from your wound.  The skin edges of your wound start to separate after your sutures have been removed.  Your wound becomes thick, raised, and darker in color after your sutures come out (scarring).   This information is not intended to replace advice given to you by your health care provider. Make sure you discuss any questions you have with your health care provider.   Document Released: 05/17/2001 Document Revised: 09/12/2014 Document Reviewed: 01/29/2014 Elsevier Interactive Patient Education 2016 Elsevier Inc.  Wound Check If you have a wound, it may take some time to heal. Eventually, a scar will form. The scar will also fade with time. It is important to take care of your wound while it is healing. This helps to protect your wound from infection.  HOW SHOULD I TAKE CARE OF MY WOUND AT HOME?  Some wounds are allowed to close on their own or are repaired at a later date. There are many different ways to close and cover a wound, including stitches (sutures), skin glue, and adhesive strips. Follow your health care provider's instructions about:  Wound care.  Bandage (dressing) changes and removal.  Wound closure removal.  Take medicines only as directed by your health care  provider.  Keep all follow-up visits as directed by your health care provider. This is important.  Do not take baths, swim, or use a hot tub until your health care provider approves. You may shower as directed by your health care provider.  Keep your wound clean and dry. WHAT AFFECTS SCAR FORMATION? Scars affect each person differently. How your body scars depends on:  The location and size of your wound.  Traits that you inherited from your parents (genetic predisposition).  How you take care of your wound. Irritation and inflammation increase the amount of scar formation.  Sun exposure. This can darken a scar. WHEN SHOULD I CALL OR SEE MY HEALTH CARE PROVIDER? Call or see your health care provider if:  You have redness, swelling, or pain at your wound site.  You have fluid, blood, or pus coming from your wound.  You have muscle aches, chills, or a general ill feeling.  You notice a bad smell coming from the wound.  Your wound separates after the sutures, staples, or skin adhesive strips have been removed.  You have persistent nausea or vomiting.  You have a fever.  You are dizzy. WHEN SHOULD I CALL 911 OR GO TO THE EMERGENCY ROOM? Call 911 or go to the emergency room if:  You faint.  You have difficulty breathing.   This information is not intended to replace advice given to you by your health care provider. Make sure you discuss any questions you have with your health care provider.   Document Released:  05/28/2004 Document Revised: 09/12/2014 Document Reviewed: 06/03/2014 Elsevier Interactive Patient Education [employer].

## 2016-02-17 ENCOUNTER — Ambulatory Visit
Admission: EM | Admit: 2016-02-17 | Discharge: 2016-02-17 | Discharge status: Absent without leave | Payer: Medicare Other | Attending: Family Medicine | Admitting: Family Medicine

## 2016-02-21 ENCOUNTER — Other Ambulatory Visit: Payer: Self-pay | Admitting: Internal Medicine

## 2016-02-25 DIAGNOSIS — M9902 Segmental and somatic dysfunction of thoracic region: Secondary | ICD-10-CM | POA: Diagnosis not present

## 2016-02-25 DIAGNOSIS — M531 Cervicobrachial syndrome: Secondary | ICD-10-CM | POA: Diagnosis not present

## 2016-02-25 DIAGNOSIS — M9901 Segmental and somatic dysfunction of cervical region: Secondary | ICD-10-CM | POA: Diagnosis not present

## 2016-02-25 DIAGNOSIS — M5134 Other intervertebral disc degeneration, thoracic region: Secondary | ICD-10-CM | POA: Diagnosis not present

## 2016-03-01 ENCOUNTER — Ambulatory Visit
Admission: EM | Admit: 2016-03-01 | Discharge: 2016-03-01 | Disposition: A | Discharge status: Home / Self Care | Payer: Medicare Other

## 2016-03-01 DIAGNOSIS — Z4802 Encounter for removal of sutures: Secondary | ICD-10-CM

## 2016-03-01 NOTE — ED Notes (Signed)
Suture removal

## 2016-03-01 NOTE — ED Notes (Signed)
4 sutures removed and wound looked good.

## 2016-03-24 DIAGNOSIS — M9901 Segmental and somatic dysfunction of cervical region: Secondary | ICD-10-CM | POA: Diagnosis not present

## 2016-03-24 DIAGNOSIS — M5134 Other intervertebral disc degeneration, thoracic region: Secondary | ICD-10-CM | POA: Diagnosis not present

## 2016-03-24 DIAGNOSIS — M9902 Segmental and somatic dysfunction of thoracic region: Secondary | ICD-10-CM | POA: Diagnosis not present

## 2016-03-24 DIAGNOSIS — M531 Cervicobrachial syndrome: Secondary | ICD-10-CM | POA: Diagnosis not present

## 2016-04-14 ENCOUNTER — Other Ambulatory Visit: Payer: Self-pay | Admitting: Internal Medicine

## 2016-04-14 DIAGNOSIS — I10 Essential (primary) hypertension: Secondary | ICD-10-CM

## 2016-04-14 DIAGNOSIS — E669 Obesity, unspecified: Secondary | ICD-10-CM

## 2016-04-14 DIAGNOSIS — R7301 Impaired fasting glucose: Secondary | ICD-10-CM

## 2016-04-14 MED ORDER — LOSARTAN POTASSIUM-HCTZ 50-12.5 MG PO TABS: 1.0000 | ORAL_TABLET | Freq: Every day | ORAL | 0 refills | Status: DC

## 2016-04-14 NOTE — Telephone Encounter (Signed)
Please advise refill, last OV was in 2016, has a AWV in November, thanks

## 2016-04-14 NOTE — Telephone Encounter (Signed)
Patient needs a lab appt, please schedule

## 2016-04-14 NOTE — Telephone Encounter (Signed)
Refill for 30 days ,  hE Needs fasting labs prior to next refill . LABS ORDERED

## 2016-04-15 NOTE — Telephone Encounter (Signed)
Ok. Pt is scheduled. Thank you! °

## 2016-04-26 ENCOUNTER — Other Ambulatory Visit (INDEPENDENT_AMBULATORY_CARE_PROVIDER_SITE_OTHER): Payer: Medicare Other

## 2016-04-26 DIAGNOSIS — Z23 Encounter for immunization: Secondary | ICD-10-CM

## 2016-04-26 DIAGNOSIS — E669 Obesity, unspecified: Secondary | ICD-10-CM | POA: Diagnosis not present

## 2016-04-26 DIAGNOSIS — I10 Essential (primary) hypertension: Secondary | ICD-10-CM | POA: Diagnosis not present

## 2016-04-26 DIAGNOSIS — R7301 Impaired fasting glucose: Secondary | ICD-10-CM | POA: Diagnosis not present

## 2016-04-26 LAB — LIPID PANEL
CHOLESTEROL: 138 mg/dL (ref 0–200)
HDL: 32 mg/dL — AB (ref >39.00)
LDL CALC: 83 mg/dL (ref 0–99)
NonHDL: 106.06
Total CHOL/HDL Ratio: 4
Triglycerides: 113 mg/dL (ref 0.0–149.0)
VLDL: 22.6 mg/dL (ref 0.0–40.0)

## 2016-04-26 LAB — COMPREHENSIVE METABOLIC PANEL
ALBUMIN: 4.3 g/dL (ref 3.5–5.2)
ALK PHOS: 56 U/L (ref 39–117)
ALT: 19 U/L (ref 0–53)
AST: 19 U/L (ref 0–37)
BUN: 22 mg/dL (ref 6–23)
CHLORIDE: 104 meq/L (ref 96–112)
CO2: 28 mEq/L (ref 19–32)
Calcium: 9.2 mg/dL (ref 8.4–10.5)
Creatinine, Ser: 1.07 mg/dL (ref 0.40–1.50)
GFR: 72.68 mL/min (ref >60.00)
Glucose, Bld: 111 mg/dL — ABNORMAL HIGH (ref 70–99)
POTASSIUM: 4.2 meq/L (ref 3.5–5.1)
Sodium: 138 mEq/L (ref 135–145)
TOTAL PROTEIN: 6.7 g/dL (ref 6.0–8.3)
Total Bilirubin: 1 mg/dL (ref 0.2–1.2)

## 2016-04-26 LAB — HEMOGLOBIN A1C: HEMOGLOBIN A1C: 5.7 % (ref 4.6–6.5)

## 2016-05-02 ENCOUNTER — Telehealth: Payer: Self-pay | Admitting: *Deleted

## 2016-05-02 NOTE — Telephone Encounter (Signed)
-----   Message from [name], MD sent at 04/28/2016  7:22 PM EDT ----- Labs are normal and a1c still not suggestive of Diabetes despite having an elevated fasting glucose. He is overdue for 6 month follow up but has been seen in urgetn care 4 times over May/June and BPs have been normal.  Plesae scheudle visit in November for wellness with denisa and follow up with me

## 2016-05-02 NOTE — Telephone Encounter (Signed)
Patient needs and appointment with Dr. Derrel Nip, scheduled for on or after 07/22/16. 30 min visit.

## 2016-05-02 NOTE — Telephone Encounter (Signed)
Pt has been scheduled on 11/17 at 9am.

## 2016-05-05 DIAGNOSIS — M531 Cervicobrachial syndrome: Secondary | ICD-10-CM | POA: Diagnosis not present

## 2016-05-05 DIAGNOSIS — M9902 Segmental and somatic dysfunction of thoracic region: Secondary | ICD-10-CM | POA: Diagnosis not present

## 2016-05-05 DIAGNOSIS — M9901 Segmental and somatic dysfunction of cervical region: Secondary | ICD-10-CM | POA: Diagnosis not present

## 2016-05-05 DIAGNOSIS — M5134 Other intervertebral disc degeneration, thoracic region: Secondary | ICD-10-CM | POA: Diagnosis not present

## 2016-05-21 ENCOUNTER — Other Ambulatory Visit: Payer: Self-pay | Admitting: Internal Medicine

## 2016-06-09 DIAGNOSIS — M5134 Other intervertebral disc degeneration, thoracic region: Secondary | ICD-10-CM | POA: Diagnosis not present

## 2016-06-09 DIAGNOSIS — M9901 Segmental and somatic dysfunction of cervical region: Secondary | ICD-10-CM | POA: Diagnosis not present

## 2016-06-09 DIAGNOSIS — M531 Cervicobrachial syndrome: Secondary | ICD-10-CM | POA: Diagnosis not present

## 2016-06-09 DIAGNOSIS — M9902 Segmental and somatic dysfunction of thoracic region: Secondary | ICD-10-CM | POA: Diagnosis not present

## 2016-06-25 ENCOUNTER — Other Ambulatory Visit: Payer: Self-pay | Admitting: Internal Medicine

## 2016-06-30 DIAGNOSIS — M531 Cervicobrachial syndrome: Secondary | ICD-10-CM | POA: Diagnosis not present

## 2016-06-30 DIAGNOSIS — M5134 Other intervertebral disc degeneration, thoracic region: Secondary | ICD-10-CM | POA: Diagnosis not present

## 2016-06-30 DIAGNOSIS — M9901 Segmental and somatic dysfunction of cervical region: Secondary | ICD-10-CM | POA: Diagnosis not present

## 2016-06-30 DIAGNOSIS — M9902 Segmental and somatic dysfunction of thoracic region: Secondary | ICD-10-CM | POA: Diagnosis not present

## 2016-07-06 ENCOUNTER — Ambulatory Visit
Admission: EM | Admit: 2016-07-06 | Discharge: 2016-07-06 | Disposition: A | Discharge status: Home / Self Care | Payer: Medicare Other | Attending: Family Medicine | Admitting: Family Medicine

## 2016-07-06 DIAGNOSIS — J0100 Acute maxillary sinusitis, unspecified: Secondary | ICD-10-CM | POA: Diagnosis not present

## 2016-07-06 DIAGNOSIS — R059 Cough, unspecified: Secondary | ICD-10-CM

## 2016-07-06 DIAGNOSIS — R05 Cough: Secondary | ICD-10-CM | POA: Diagnosis not present

## 2016-07-06 MED ORDER — DOXYCYCLINE HYCLATE 100 MG PO TABS: 100.0000 mg | ORAL_TABLET | Freq: Two times a day (BID) | ORAL | 0 refills | Status: DC

## 2016-07-06 MED ORDER — BENZONATATE 100 MG PO CAPS: 100.0000 mg | ORAL_CAPSULE | Freq: Three times a day (TID) | ORAL | 0 refills | Status: DC

## 2016-07-06 NOTE — ED Provider Notes (Signed)
[facility]    CSN: [id number] Arrival date & time: 07/06/16  1153     History   Chief Complaint Chief Complaint  Patient presents with  . Cough    HPI Austin Duran is a 70 y.o. male.   The history is provided by the patient.  Cough  Associated symptoms: fever and headaches   Associated symptoms: no wheezing   URI  Presenting symptoms: congestion, cough, facial pain, fatigue and fever   Severity:  Moderate Onset quality:  Sudden Duration:  1 week Timing:  Constant Progression:  Worsening Chronicity:  New Relieved by:  Nothing Ineffective treatments:  OTC medications Associated symptoms: headaches and sinus pain   Associated symptoms: no wheezing   Risk factors: being elderly   Risk factors: no chronic cardiac disease, no chronic kidney disease, no chronic respiratory disease, no diabetes mellitus, no immunosuppression, no recent illness, no recent travel and no sick contacts     Past Medical History:  Diagnosis Date  . Depression   . Hearing loss    had a perforated right TM,   followed by Dr. Tami Ribas  . Hypertension   . Panic attack   . Panic attacks     Patient Active Problem List   Diagnosis Date Noted  . Encounter for preventive health examination 07/18/2015  . OSA (obstructive sleep apnea) 07/13/2014  . B12 deficiency 07/10/2014  . Cerumen impaction 07/10/2014  . Vitamin D deficiency 07/03/2013  . Enlarged prostate without lower urinary tract symptoms (luts) 07/02/2013  . Bilateral neuropathy of upper extremities 07/02/2013  . Hypertension 07/06/2012  . Basal cell carcinoma of back 01/02/2012  . Hearing loss, sensorineural, asymmetrical 01/02/2012  . S/P bilateral cataract extraction 01/02/2012  . Encounter for Medicare annual wellness exam 01/02/2012  . Obesity (BMI 30-39.9) 01/02/2012  . Screening for colon cancer 08/07/2011  . Screening for prostate cancer 08/07/2011  . Dyslipidemia with elevated low density lipoprotein  cholesterol and abnormally low high density lipoprotein cholesterol 08/07/2011  . Lumbago 08/07/2011    Past Surgical History:  Procedure Laterality Date  . CATARACT EXTRACTION Bilateral Aug 2013   [facility]  . CATARACT EXTRACTION, BILATERAL  May 2013   [facility]  . EYE SURGERY  2008   eyelid surgery, Dr. Loletta Specter  . FRACTURE SURGERY    . [facility] , right        Home Medications    Prior to Admission medications   Medication Sig Start Date End Date Taking? Authorizing Provider  aspirin 81 MG tablet Take 81 mg by mouth daily.     Yes Historical Provider, MD  Cholecalciferol (VITAMIN D-3 PO) Take 1 tablet by mouth daily.   Yes Historical Provider, MD  citalopram (CELEXA) 20 MG tablet TAKE 1 TABLET BY MOUTH EVERY DAY 05/23/16  Yes [name], MD  losartan-hydrochlorothiazide (HYZAAR) 50-12.5 MG tablet TAKE 1 TABLET BY MOUTH DAILY 04/14/16  Yes [name], MD  benzonatate (TESSALON) 100 MG capsule Take 1 capsule (100 mg total) by mouth every 8 (eight) hours. 07/06/16   Norval Gable, MD  clindamycin (CLEOCIN) 300 MG capsule Take 1 capsule (300 mg total) by mouth 3 (three) times daily. 01/15/16   Konrad Felix, PA  doxycycline (VIBRA-TABS) 100 MG tablet Take 1 tablet (100 mg total) by mouth 2 (two) times daily. 07/06/16   Norval Gable, MD  losartan-hydrochlorothiazide (HYZAAR) 50-12.5 MG tablet TAKE 1 TABLET BY MOUTH DAILY. PLEASE SCHEDULE AN APPOINTMENT 06/27/16  [name], MD  mupirocin ointment (BACTROBAN) 2 % Apply 1 application topically 3 (three) times daily. 02/15/16   Lorin Picket, PA-C    Family History Family History  Problem Relation Age of Onset  . Cancer Maternal Aunt     lung  . Cancer Paternal Uncle     colon    Social History Social History  Substance Use Topics  . Smoking status: Former Smoker    Quit date: 08/03/2004  . Smokeless tobacco: Never Used  . Alcohol use 2.5 oz/week    5 Standard drinks or equivalent  per week     Allergies   Augmentin [amoxicillin-pot clavulanate] and Lisinopril   Review of Systems Review of Systems  Constitutional: Positive for fatigue and fever.  HENT: Positive for congestion.   Respiratory: Positive for cough. Negative for wheezing.   Neurological: Positive for headaches.     Physical Exam Triage Vital Signs ED Triage Vitals  Enc Vitals Group     BP 07/06/16 1214 (!) 146/77     Pulse Rate 07/06/16 1214 78     Resp 07/06/16 1214 18     Temp 07/06/16 1214 98.2 F (36.8 C)     Temp Source 07/06/16 1214 Oral     SpO2 07/06/16 1214 98 %     Weight 07/06/16 1213 208 lb (94.3 kg)     Height 07/06/16 1213 5\' 9"  (1.753 m)     Head Circumference --      Peak Flow --      Pain Score 07/06/16 1215 1     Pain Loc --      Pain Edu? --      Excl. in [place]? --    No data found.   Updated Vital Signs BP (!) 146/77 (BP Location: Left Arm)   Pulse 78   Temp 98.2 F (36.8 C) (Oral)   Resp 18   Ht 5\' 9"  (1.753 m)   Wt 208 lb (94.3 kg)   SpO2 98%   BMI 30.72 kg/m   Visual Acuity Right Eye Distance:   Left Eye Distance:   Bilateral Distance:    Right Eye Near:   Left Eye Near:    Bilateral Near:     Physical Exam  Constitutional: He appears well-developed and well-nourished. No distress.  HENT:  Head: Normocephalic and atraumatic.  Right Ear: Tympanic membrane, external ear and ear canal normal.  Left Ear: Tympanic membrane, external ear and ear canal normal.  Nose: Right sinus exhibits maxillary sinus tenderness and frontal sinus tenderness. Left sinus exhibits maxillary sinus tenderness and frontal sinus tenderness.  Mouth/Throat: Uvula is midline, oropharynx is clear and moist and mucous membranes are normal. No oropharyngeal exudate or tonsillar abscesses. No tonsillar exudate.  Eyes: Conjunctivae and EOM are normal. Pupils are equal, round, and reactive to light. Right eye exhibits no discharge. Left eye exhibits no discharge. No scleral  icterus.  Neck: Normal range of motion. Neck supple. No tracheal deviation present. No thyromegaly present.  Cardiovascular: Normal rate, regular rhythm and normal heart sounds.   Pulmonary/Chest: Effort normal and breath sounds normal. No stridor. No respiratory distress. He has no wheezes. He has no rales. He exhibits no tenderness.  Lymphadenopathy:    He has no cervical adenopathy.  Neurological: He is alert.  Skin: Skin is warm and dry. No rash noted. He is not diaphoretic.  Nursing note and vitals reviewed.    UC Treatments / Results  Labs (all labs ordered are  listed, but only abnormal results are displayed) Labs Reviewed - No data to display  EKG  EKG Interpretation None       Radiology No results found.  Procedures Procedures (including critical care time)  Medications Ordered in UC Medications - No data to display   Initial Impression / Assessment and Plan / UC Course  I have reviewed the triage vital signs and the nursing notes.  Pertinent labs & imaging results that were available during my care of the patient were reviewed by me and considered in my medical decision making (see chart for details).  Clinical Course      Final Clinical Impressions(s) / UC Diagnoses   Final diagnoses:  Acute maxillary sinusitis, recurrence not specified  Cough    New Prescriptions Discharge Medication List as of 07/06/2016 12:53 PM    START taking these medications   Details  benzonatate (TESSALON) 100 MG capsule Take 1 capsule (100 mg total) by mouth every 8 (eight) hours., Starting Wed 07/06/2016, Normal    doxycycline (VIBRA-TABS) 100 MG tablet Take 1 tablet (100 mg total) by mouth 2 (two) times daily., Starting Wed 07/06/2016, Normal      1.  diagnosis reviewed with patient 2. rx as per orders above; reviewed possible side effects, interactions, risks and benefits  3. Recommend supportive treatment with rest, fluids 4. Follow-up prn if symptoms worsen or  don't improve   Norval Gable, MD 07/06/16 1305

## 2016-07-06 NOTE — ED Triage Notes (Signed)
Patient complains of cough and congestion x 1 week. Patient also has some redness in right eye. Patient states that he has been having some sinus pain and pressure with headaches as well.

## 2016-07-15 ENCOUNTER — Ambulatory Visit (INDEPENDENT_AMBULATORY_CARE_PROVIDER_SITE_OTHER): Payer: Medicare Other

## 2016-07-15 VITALS — BP 142/82 | HR 79 | Temp 97.8°F | Resp 14 | Ht 68.0 in | Wt 218.0 lb

## 2016-07-15 DIAGNOSIS — Z0000 Encounter for general adult medical examination without abnormal findings: Secondary | ICD-10-CM | POA: Diagnosis not present

## 2016-07-15 NOTE — Progress Notes (Signed)
Subjective:   Austin Duran is a 70 y.o. male who presents for Medicare Annual/Subsequent preventive examination.  Review of Systems:  No ROS.  Medicare Wellness Visit.  Cardiac Risk Factors include: advanced age (>82men, >73 women);male gender;hypertension;obesity (BMI >30kg/m2)     Objective:    Vitals: BP (!) 142/82 (BP Location: Left Arm, Patient Position: Sitting, Cuff Size: Normal)   Pulse 79   Temp 97.8 F (36.6 C) (Oral)   Resp 14   Ht 5\' 8"  (1.727 m)   Wt 218 lb (98.9 kg)   SpO2 95%   BMI 33.15 kg/m   Body mass index is 33.15 kg/m.  Tobacco History  Smoking Status  . Former Smoker  . Quit date: 08/03/2004  Smokeless Tobacco  . Never Used     Counseling given: Not Answered   Past Medical History:  Diagnosis Date  . Depression   . Hearing loss    had a perforated right TM,   followed by Dr. Tami Ribas  . Hypertension   . Panic attack   . Panic attacks    Past Surgical History:  Procedure Laterality Date  . CATARACT EXTRACTION Bilateral Aug 2013   [facility]  . CATARACT EXTRACTION, BILATERAL  May 2013   Porfilio  . EYE SURGERY  2008   eyelid surgery, Dr. Loletta Specter  . FRACTURE SURGERY    . [facility] , right    Family History  Problem Relation Age of Onset  . Cancer Maternal Aunt     lung  . Cancer Paternal Uncle     colon   History  Sexual Activity  . Sexual activity: Yes    Outpatient Encounter Prescriptions as of 07/15/2016  Medication Sig  . aspirin 81 MG tablet Take 81 mg by mouth daily.    . benzonatate (TESSALON) 100 MG capsule Take 1 capsule (100 mg total) by mouth every 8 (eight) hours.  . Cholecalciferol (VITAMIN D-3 PO) Take 1 tablet by mouth daily.  . citalopram (CELEXA) 20 MG tablet TAKE 1 TABLET BY MOUTH EVERY DAY  . clindamycin (CLEOCIN) 300 MG capsule Take 1 capsule (300 mg total) by mouth 3 (three) times daily.  [name] doxycycline (VIBRA-TABS) 100 MG tablet Take 1 tablet (100 mg total) by mouth 2  (two) times daily.  [name] losartan-hydrochlorothiazide (HYZAAR) 50-12.5 MG tablet TAKE 1 TABLET BY MOUTH DAILY  . losartan-hydrochlorothiazide (HYZAAR) 50-12.5 MG tablet TAKE 1 TABLET BY MOUTH DAILY. PLEASE SCHEDULE AN APPOINTMENT  . mupirocin ointment (BACTROBAN) 2 % Apply 1 application topically 3 (three) times daily.   No facility-administered encounter medications on file as of 07/15/2016.     Activities of Daily Living In your present state of health, do you have any difficulty performing the following activities: 07/15/2016 07/23/2015  Hearing? Y (No Data)  Vision? N -  Difficulty concentrating or making decisions? N -  Walking or climbing stairs? Y -  Dressing or bathing? N -  Doing errands, shopping? N -  Preparing Food and eating ? N -  Using the Toilet? N -  In the past six months, have you accidently leaked urine? N -  Do you have problems with loss of bowel control? N -  Managing your Medications? N -  Managing your Finances? N -  Housekeeping or managing your Housekeeping? N -  Some recent data might be hidden    Patient Care Team: [name], MD as PCP - General (Internal Medicine)  Assessment:    This is a routine wellness examination for [place]. The goal of the wellness visit is to assist the patient how to close the gaps in care and create a preventative care plan for the patient.   Taking calcium VIT D as appropriate/Osteoporosis reviewed.  Medications reviewed; taking without issues or barriers.  Safety issues reviewed; lives with wife.  Smoke detectors in the home.Firearms locked in a safe within the home. Wears seatbelts when driving or riding with others. No violence in the home.  No identified risk were noted; The patient was oriented x 3; appropriate in dress and manner and no objective failures at ADL's or IADL's.   Body mass index; discussed the importance of a healthy diet, water intake and exercise. He follows his wife's ADA diet.  Encouraged to maintain low carb intake, lean meats, vegetables. He plans to add more water intake and continue exercise regimen of walking.    Health maintenance gaps; closed.  Patient Concerns: None at this time. Follow up with PCP as needed.  Exercise Activities and Dietary recommendations Current Exercise Habits: Home exercise routine, Type of exercise: walking, Time (Minutes): 60, Frequency (Times/Week): 3, Weekly Exercise (Minutes/Week): 180, Intensity: Mild  Goals    . Healthy Lifestyle          Increase water intake up to 1 bottle daily =2 cups.     Maintain exercise regiment currently in place.    Low carb foods. Lean meats, vegetables.      Fall Risk Fall Risk  07/15/2016 07/23/2015 07/02/2013 09/07/2012  Falls in the past year? No No No No   Depression Screen PHQ 2/9 Scores 07/15/2016 07/23/2015 07/02/2013 09/07/2012  PHQ - 2 Score 0 0 0 0    Cognitive Function MMSE - Mini Mental State Exam 07/15/2016 07/23/2015  Orientation to time 5 5  Orientation to Place 5 5  Registration 3 3  Attention/ Calculation 5 5  Recall 3 3  Language- name 2 objects 2 2  Language- repeat 1 1  Language- follow 3 step command 3 3  Language- read & follow direction 1 1  Write a sentence 1 1  Copy design 1 1  Total score 30 30     6CIT Screen 07/15/2016  What Year? 0 points  What month? 0 points  What time? 0 points  Count back from 20 0 points  Months in reverse 0 points    Immunization History  Administered Date(s) Administered  . Influenza Split 08/04/2011, 07/04/2012, 06/15/2015  . Influenza, High Dose Seasonal PF 07/02/2013  . Influenza,inj,Quad PF,36+ Mos 07/10/2014, 04/26/2016  . Pneumococcal Conjugate-13 07/02/2013  . Pneumococcal Polysaccharide-23 07/10/2014  . Pneumococcal-Unspecified 07/10/2009  . Tdap 08/04/2011, 02/15/2016  . Zoster 07/26/2012   Screening Tests Health Maintenance  Topic Date Due  . COLONOSCOPY  08/03/2021  . TETANUS/TDAP  02/14/2026  .  INFLUENZA VACCINE  Completed  . ZOSTAVAX  Completed  . Hepatitis C Screening  Completed  . PNA vac Low Risk Adult  Completed      Plan:    End of life planning; Advance aging; Advanced directives discussed. Copy of current HCPOA/Living Will requested.  Medicare Attestation I have personally reviewed: The patient's medical and social history Their use of alcohol, tobacco or illicit drugs Their current medications and supplements The patient's functional ability including ADLs,fall risks, home safety risks, cognitive, and hearing and visual impairment Diet and physical activities Evidence for depression   The patient's weight, height, BMI, and visual acuity have  been recorded in the chart.  I have made referrals and provided education to the patient based on review of the above and I have provided the patient with a written personalized care plan for preventive services.    During the course of the visit the patient was educated and counseled about the following appropriate screening and preventive services:   Vaccines to include Pneumoccal, Influenza, Hepatitis B, Td, Zostavax, HCV  Electrocardiogram  Cardiovascular Disease  Colorectal cancer screening  Diabetes screening  Prostate Cancer Screening  Glaucoma screening  Nutrition counseling   Smoking cessation counseling  Patient Instructions (the written plan) was given to the patient.    Varney Biles, LPN  [id number]

## 2016-07-15 NOTE — Patient Instructions (Addendum)
  Mr. Linscheid , Thank you for taking time to come for your Medicare Wellness Visit. I appreciate your ongoing commitment to your health goals. Please review the following plan we discussed and let me know if I can assist you in the future.   FOLLOW UP WITH DR. Derrel Nip AS NEEDED.  These are the goals we discussed: Goals    . Healthy Lifestyle          Increase water intake up to 1 bottle daily =2 cups.     Maintain exercise regiment currently in place.    Low carb foods. Lean meats, vegetables.       This is a list of the screening recommended for you and due dates:  Health Maintenance  Topic Date Due  . Colon Cancer Screening  08/03/2021  . Tetanus Vaccine  02/14/2026  . Flu Shot  Completed  . Shingles Vaccine  Completed  .  Hepatitis C: One time screening is recommended by Center for Disease Control  (CDC) for  adults born from [birth year] through 1965.   Completed  . Pneumonia vaccines  Completed

## 2016-07-17 NOTE — Progress Notes (Signed)
  I have reviewed the above information and agree with above.   Alias Villagran, MD 

## 2016-07-22 ENCOUNTER — Ambulatory Visit: Payer: Medicare Other

## 2016-07-22 ENCOUNTER — Ambulatory Visit: Payer: Medicare Other | Admitting: Internal Medicine

## 2016-08-08 ENCOUNTER — Ambulatory Visit (INDEPENDENT_AMBULATORY_CARE_PROVIDER_SITE_OTHER): Payer: Medicare Other | Admitting: Internal Medicine

## 2016-08-08 ENCOUNTER — Encounter: Payer: Self-pay | Admitting: Internal Medicine

## 2016-08-08 ENCOUNTER — Ambulatory Visit (INDEPENDENT_AMBULATORY_CARE_PROVIDER_SITE_OTHER): Payer: Medicare Other

## 2016-08-08 VITALS — BP 140/82 | HR 70 | Temp 97.6°F | Resp 12 | Ht 68.0 in | Wt 209.5 lb

## 2016-08-08 DIAGNOSIS — I10 Essential (primary) hypertension: Secondary | ICD-10-CM

## 2016-08-08 DIAGNOSIS — M542 Cervicalgia: Secondary | ICD-10-CM

## 2016-08-08 DIAGNOSIS — E559 Vitamin D deficiency, unspecified: Secondary | ICD-10-CM

## 2016-08-08 DIAGNOSIS — E784 Other hyperlipidemia: Secondary | ICD-10-CM | POA: Diagnosis not present

## 2016-08-08 DIAGNOSIS — R7301 Impaired fasting glucose: Secondary | ICD-10-CM

## 2016-08-08 DIAGNOSIS — E538 Deficiency of other specified B group vitamins: Secondary | ICD-10-CM

## 2016-08-08 DIAGNOSIS — Z1211 Encounter for screening for malignant neoplasm of colon: Secondary | ICD-10-CM | POA: Diagnosis not present

## 2016-08-08 DIAGNOSIS — R5383 Other fatigue: Secondary | ICD-10-CM

## 2016-08-08 DIAGNOSIS — Z125 Encounter for screening for malignant neoplasm of prostate: Secondary | ICD-10-CM | POA: Diagnosis not present

## 2016-08-08 DIAGNOSIS — E785 Hyperlipidemia, unspecified: Secondary | ICD-10-CM

## 2016-08-08 DIAGNOSIS — G5693 Unspecified mononeuropathy of bilateral upper limbs: Secondary | ICD-10-CM

## 2016-08-08 DIAGNOSIS — E669 Obesity, unspecified: Secondary | ICD-10-CM

## 2016-08-08 DIAGNOSIS — M5032 Other cervical disc degeneration, mid-cervical region, unspecified level: Secondary | ICD-10-CM | POA: Diagnosis not present

## 2016-08-08 DIAGNOSIS — Z23 Encounter for immunization: Secondary | ICD-10-CM

## 2016-08-08 MED ORDER — CITALOPRAM HYDROBROMIDE 20 MG PO TABS: 20.0000 mg | ORAL_TABLET | Freq: Every day | ORAL | 1 refills | Status: DC

## 2016-08-08 MED ORDER — LOSARTAN POTASSIUM-HCTZ 100-12.5 MG PO TABS: 1.0000 | ORAL_TABLET | Freq: Every day | ORAL | 1 refills | Status: DC

## 2016-08-08 NOTE — Patient Instructions (Addendum)
Your blood pressure is not at goal (120/70 is goal to prevent heart attacks and strokes) .  I am increasing your medication to 100 mg losartan daily   Return in February for fasting labs including PSA   Your x rays today will tell us if you need an MRi of your cervical spine    The weight loss is excellent!  Try to add 2 days of walking during the week   You received your final Pneumonia vaccine today   We will repeat the Cologurad request

## 2016-08-08 NOTE — Progress Notes (Signed)
Subjective:  Patient ID: Austin Duran, male    DOB: 06-15-1946  Age: 70 y.o. MRN: [id number]  CC: The primary encounter diagnosis was Cervicalgia. Diagnoses of Screening for colon cancer, Screening for prostate cancer, Dyslipidemia with elevated low density lipoprotein cholesterol and abnormally low high density lipoprotein cholesterol, Essential hypertension, Vitamin D deficiency, B12 deficiency, Fatigue, unspecified type, Impaired fasting glucose, Need for prophylactic vaccination against Streptococcus pneumoniae (pneumococcus), Bilateral neuropathy of upper extremities, and Obesity (BMI 30-39.9) were also pertinent to this visit.  HPI Austin Duran presents for follow up on hypertension and obesity.  Last seen Nov 2016.  Needs PSA Needs fasting labs and a1c Colon Ca screening:  Has not sent cologuard one year ago.     Weight loss dietary restrictions,  Eating less.   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 Walking on the weekends.  tolerating  Citalopram .  One panic attack in the last several months  Cc:  Arms going numb.  Seeing chiropractor.arms feel numb and rubbery. Not dropping things.  Lasts 3-4 minutes Posterior neck pain . Occurs up to 7 times per weekend. No vision changes , no dizziness.    Not taking aleve or motrin  Does not want medicatoins    Bilateral cataract suregery with lens.  Just reading glasses,  Last exam one year ago  Lab Results  Component Value Date   HGBA1C 5.7 04/26/2016         Outpatient Medications Prior to Visit  Medication Sig Dispense Refill  . aspirin 81 MG tablet Take 81 mg by mouth daily.      . Cholecalciferol (VITAMIN D-3 PO) Take 1 tablet by mouth daily.    . citalopram (CELEXA) 20 MG tablet TAKE 1 TABLET BY MOUTH EVERY DAY 90 tablet 0  . losartan-hydrochlorothiazide (HYZAAR) 50-12.5 MG tablet TAKE 1 TABLET BY MOUTH DAILY 30 tablet 0  . mupirocin ointment (BACTROBAN) 2 %  Apply 1 application topically 3 (three) times daily. (Patient not taking: Reported on 08/08/2016) 22 g 0  . benzonatate (TESSALON) 100 MG capsule Take 1 capsule (100 mg total) by mouth every 8 (eight) hours. (Patient not taking: Reported on 08/08/2016) 21 capsule 0  . clindamycin (CLEOCIN) 300 MG capsule Take 1 capsule (300 mg total) by mouth 3 (three) times daily. (Patient not taking: Reported on 08/08/2016) 15 capsule 0  . doxycycline (VIBRA-TABS) 100 MG tablet Take 1 tablet (100 mg total) by mouth 2 (two) times daily. (Patient not taking: Reported on 08/08/2016) 20 tablet 0  . losartan-hydrochlorothiazide (HYZAAR) 50-12.5 MG tablet TAKE 1 TABLET BY MOUTH DAILY. PLEASE SCHEDULE AN APPOINTMENT 30 tablet 4   No facility-administered medications prior to visit.     Review of Systems;  Patient denies headache, fevers, malaise, unintentional weight loss, skin rash, eye pain, sinus congestion and sinus pain, sore throat, dysphagia,  hemoptysis , cough, dyspnea, wheezing, chest pain, palpitations, orthopnea, edema, abdominal pain, nausea, melena, diarrhea, constipation, flank pain, dysuria, hematuria, urinary  Frequency, nocturia,  seizures,  Focal weakness, Loss of consciousness,  Tremor, insomnia, depression, anxiety, and suicidal ideation.      Objective:  BP 140/82   Pulse 70   Temp 97.6 F (36.4 C) (Oral)   Resp 12   Ht 5\' 8"  (1.727 m)   Wt 209 lb 8 oz (95 kg)   SpO2 95%   BMI 31.85 kg/m   BP Readings from Last 3 Encounters:  08/08/16  140/82  07/15/16 (!) 142/82  07/06/16 (!) 146/77    Wt Readings from Last 3 Encounters:  08/08/16 209 lb 8 oz (95 kg)  07/15/16 218 lb (98.9 kg)  07/06/16 208 lb (94.3 kg)    General appearance: alert, cooperative and appears stated age Ears: normal TM's and external ear canals both ears Throat: lips, mucosa, and tongue normal; teeth and gums normal Neck: no adenopathy, no carotid bruit, supple, symmetrical, trachea midline and thyroid not  enlarged, symmetric, no tenderness/mass/nodules Back: symmetric, no curvature. ROM normal. No CVA tenderness. Lungs: clear to auscultation bilaterally Heart: regular rate and rhythm, S1, S2 normal, no murmur, click, rub or gallop Abdomen: soft, non-tender; bowel sounds normal; no masses,  no organomegaly Pulses: 2+ and symmetric Skin: Skin color, texture, turgor normal. No rashes or lesions Lymph nodes: Cervical, supraclavicular, and axillary nodes normal.  Lab Results  Component Value Date   HGBA1C 5.7 04/26/2016   HGBA1C 5.7 07/16/2015    Lab Results  Component Value Date   CREATININE 1.07 04/26/2016   CREATININE 1.07 07/16/2015   CREATININE 1.07 01/08/2015    Lab Results  Component Value Date   WBC 6.5 07/10/2014   HGB 15.3 07/10/2014   HCT 46.4 07/10/2014   PLT 276.0 07/10/2014   GLUCOSE 111 (H) 04/26/2016   CHOL 138 04/26/2016   TRIG 113.0 04/26/2016   HDL 32.00 (L) 04/26/2016   LDLCALC 83 04/26/2016   ALT 19 04/26/2016   AST 19 04/26/2016   NA 138 04/26/2016   K 4.2 04/26/2016   CL 104 04/26/2016   CREATININE 1.07 04/26/2016   BUN 22 04/26/2016   CO2 28 04/26/2016   TSH 0.88 07/10/2014   PSA 0.92 07/16/2015   HGBA1C 5.7 04/26/2016    No results found.  Assessment & Plan:   Problem List Items Addressed This Visit    B12 deficiency    Has not been rechecked since 2015 ,   Lab Results  Component Value Date   [id number] (L) 02/20/2014         Relevant Orders   Vitamin B12   Bilateral neuropathy of upper extremities    Suspect cervical stenosis given bilateral nature. Films and workup requested.       Relevant Medications   citalopram (CELEXA) 20 MG tablet   Dyslipidemia with elevated low density lipoprotein cholesterol and abnormally low high density lipoprotein cholesterol    LDL and triglycerides are at goal on current medications. He has no side effects and liver enzymes are normal. No changes today  Lab Results  Component Value Date     CHOL 138 04/26/2016   HDL 32.00 (L) 04/26/2016   LDLCALC 83 04/26/2016   TRIG 113.0 04/26/2016   CHOLHDL 4 04/26/2016    Lab Results  Component Value Date   ALT 19 04/26/2016   AST 19 04/26/2016   ALKPHOS 56 04/26/2016   BILITOT 1.0 04/26/2016               Relevant Orders   Lipid panel   Hypertension    Not at goal,  Increasing losartan to 100 mg daily       Relevant Medications   losartan-hydrochlorothiazide (HYZAAR) 100-12.5 MG tablet   Other Relevant Orders   Comprehensive metabolic panel   Obesity (BMI 30-39.9)   Screening for colon cancer    cologuard ordered again      Screening for prostate cancer   Relevant Orders   PSA, Medicare   Vitamin D deficiency  Previous level was low at 15,  Rechecking today       Relevant Orders   VITAMIN D 25 Hydroxy (Vit-D Deficiency, Fractures)    Other Visit Diagnoses    Cervicalgia    -  Primary   Relevant Orders   DG Cervical Spine Complete (Completed)   Fatigue, unspecified type       Relevant Orders   TSH   CBC with Differential/Platelet   Impaired fasting glucose       Relevant Orders   Hemoglobin A1c   Need for prophylactic vaccination against Streptococcus pneumoniae (pneumococcus)          I have discontinued Mr. Critcher clindamycin, losartan-hydrochlorothiazide, losartan-hydrochlorothiazide, doxycycline, and benzonatate. I have also changed his citalopram. Additionally, I am having him start on losartan-hydrochlorothiazide. Lastly, I am having him maintain his aspirin, Cholecalciferol (VITAMIN D-3 PO), and mupirocin ointment.  Meds ordered this encounter  Medications  . losartan-hydrochlorothiazide (HYZAAR) 100-12.5 MG tablet    Sig: Take 1 tablet by mouth daily.    Dispense:  90 tablet    Refill:  1  . citalopram (CELEXA) 20 MG tablet    Sig: Take 1 tablet (20 mg total) by mouth daily.    Dispense:  90 tablet    Refill:  1    Medications Discontinued During This Encounter  Medication  Reason  . losartan-hydrochlorothiazide (HYZAAR) [id number] MG tablet Duplicate  . losartan-hydrochlorothiazide (HYZAAR) 50-12.5 MG tablet   . citalopram (CELEXA) 20 MG tablet Reorder  . doxycycline (VIBRA-TABS) 100 MG tablet   . clindamycin (CLEOCIN) 300 MG capsule   . benzonatate (TESSALON) 100 MG capsule     Follow-up: No Follow-up on file.   [name], MD

## 2016-08-08 NOTE — Progress Notes (Signed)
Pre-visit discussion using our clinic review tool. No additional management support is needed unless otherwise documented below in the visit note.  

## 2016-08-09 ENCOUNTER — Encounter: Payer: Self-pay | Admitting: Internal Medicine

## 2016-08-09 NOTE — Assessment & Plan Note (Signed)
Previous level was low at 15,  Rechecking today

## 2016-08-09 NOTE — Progress Notes (Signed)
Cologuard ordered

## 2016-08-09 NOTE — Assessment & Plan Note (Signed)
Not at goal,  Increasing losartan to 100 mg daily

## 2016-08-09 NOTE — Assessment & Plan Note (Signed)
Suspect cervical stenosis given bilateral nature. Films and workup requested.

## 2016-08-09 NOTE — Assessment & Plan Note (Signed)
LDL and triglycerides are at goal on current medications. He has no side effects and liver enzymes are normal. No changes today  Lab Results  Component Value Date   CHOL 138 04/26/2016   HDL 32.00 (L) 04/26/2016   LDLCALC 83 04/26/2016   TRIG 113.0 04/26/2016   CHOLHDL 4 04/26/2016    Lab Results  Component Value Date   ALT 19 04/26/2016   AST 19 04/26/2016   ALKPHOS 56 04/26/2016   BILITOT 1.0 04/26/2016

## 2016-08-09 NOTE — Assessment & Plan Note (Signed)
Has not been rechecked since 2015 ,   Lab Results  Component Value Date   VITAMINB12 191 (L) 02/20/2014

## 2016-08-09 NOTE — Assessment & Plan Note (Signed)
cologuard ordered again

## 2016-08-11 DIAGNOSIS — M9902 Segmental and somatic dysfunction of thoracic region: Secondary | ICD-10-CM | POA: Diagnosis not present

## 2016-08-11 DIAGNOSIS — M531 Cervicobrachial syndrome: Secondary | ICD-10-CM | POA: Diagnosis not present

## 2016-08-11 DIAGNOSIS — M5134 Other intervertebral disc degeneration, thoracic region: Secondary | ICD-10-CM | POA: Diagnosis not present

## 2016-08-11 DIAGNOSIS — M9901 Segmental and somatic dysfunction of cervical region: Secondary | ICD-10-CM | POA: Diagnosis not present

## 2016-09-15 DIAGNOSIS — M531 Cervicobrachial syndrome: Secondary | ICD-10-CM | POA: Diagnosis not present

## 2016-09-15 DIAGNOSIS — M5134 Other intervertebral disc degeneration, thoracic region: Secondary | ICD-10-CM | POA: Diagnosis not present

## 2016-09-15 DIAGNOSIS — M9902 Segmental and somatic dysfunction of thoracic region: Secondary | ICD-10-CM | POA: Diagnosis not present

## 2016-09-15 DIAGNOSIS — M9901 Segmental and somatic dysfunction of cervical region: Secondary | ICD-10-CM | POA: Diagnosis not present

## 2016-10-05 DIAGNOSIS — H43813 Vitreous degeneration, bilateral: Secondary | ICD-10-CM | POA: Diagnosis not present

## 2016-10-06 ENCOUNTER — Other Ambulatory Visit (INDEPENDENT_AMBULATORY_CARE_PROVIDER_SITE_OTHER): Payer: Medicare Other

## 2016-10-06 ENCOUNTER — Telehealth: Payer: Self-pay | Admitting: Internal Medicine

## 2016-10-06 DIAGNOSIS — R5383 Other fatigue: Secondary | ICD-10-CM

## 2016-10-06 DIAGNOSIS — E559 Vitamin D deficiency, unspecified: Secondary | ICD-10-CM

## 2016-10-06 DIAGNOSIS — R7301 Impaired fasting glucose: Secondary | ICD-10-CM | POA: Diagnosis not present

## 2016-10-06 DIAGNOSIS — E784 Other hyperlipidemia: Secondary | ICD-10-CM

## 2016-10-06 DIAGNOSIS — E538 Deficiency of other specified B group vitamins: Secondary | ICD-10-CM

## 2016-10-06 DIAGNOSIS — I10 Essential (primary) hypertension: Secondary | ICD-10-CM

## 2016-10-06 DIAGNOSIS — E785 Hyperlipidemia, unspecified: Secondary | ICD-10-CM

## 2016-10-06 DIAGNOSIS — Z125 Encounter for screening for malignant neoplasm of prostate: Secondary | ICD-10-CM

## 2016-10-06 DIAGNOSIS — G5693 Unspecified mononeuropathy of bilateral upper limbs: Secondary | ICD-10-CM

## 2016-10-06 LAB — CBC WITH DIFFERENTIAL/PLATELET
BASOS PCT: 1.1 % (ref 0.0–3.0)
Basophils Absolute: 0.1 10*3/uL (ref 0.0–0.1)
EOS PCT: 1.7 % (ref 0.0–5.0)
Eosinophils Absolute: 0.1 10*3/uL (ref 0.0–0.7)
HEMATOCRIT: 45.2 % (ref 39.0–52.0)
HEMOGLOBIN: 15.4 g/dL (ref 13.0–17.0)
LYMPHS PCT: 23.5 % (ref 12.0–46.0)
Lymphs Abs: 1.7 10*3/uL (ref 0.7–4.0)
MCHC: 34 g/dL (ref 30.0–36.0)
MCV: 90.2 fl (ref 78.0–100.0)
MONO ABS: 0.8 10*3/uL (ref 0.1–1.0)
MONOS PCT: 11.3 % (ref 3.0–12.0)
Neutro Abs: 4.6 10*3/uL (ref 1.4–7.7)
Neutrophils Relative %: 62.4 % (ref 43.0–77.0)
Platelets: 256 10*3/uL (ref 150.0–400.0)
RBC: 5.01 Mil/uL (ref 4.22–5.81)
RDW: 13.2 % (ref 11.5–15.5)
WBC: 7.4 10*3/uL (ref 4.0–10.5)

## 2016-10-06 LAB — COMPREHENSIVE METABOLIC PANEL
ALT: 16 U/L (ref 0–53)
AST: 12 U/L (ref 0–37)
Albumin: 4.4 g/dL (ref 3.5–5.2)
Alkaline Phosphatase: 65 U/L (ref 39–117)
BUN: 18 mg/dL (ref 6–23)
CHLORIDE: 104 meq/L (ref 96–112)
CO2: 31 meq/L (ref 19–32)
Calcium: 9.6 mg/dL (ref 8.4–10.5)
Creatinine, Ser: 1.16 mg/dL (ref 0.40–1.50)
GFR: 66.12 mL/min (ref >60.00)
GLUCOSE: 111 mg/dL — AB (ref 70–99)
POTASSIUM: 4.6 meq/L (ref 3.5–5.1)
Sodium: 139 mEq/L (ref 135–145)
Total Bilirubin: 0.9 mg/dL (ref 0.2–1.2)
Total Protein: 6.7 g/dL (ref 6.0–8.3)

## 2016-10-06 LAB — HEMOGLOBIN A1C: Hgb A1c MFr Bld: 5.7 % (ref 4.6–6.5)

## 2016-10-06 LAB — LIPID PANEL
CHOL/HDL RATIO: 4
CHOLESTEROL: 141 mg/dL (ref 0–200)
HDL: 32.8 mg/dL — ABNORMAL LOW (ref >39.00)
LDL CALC: 90 mg/dL (ref 0–99)
NonHDL: 108.5
Triglycerides: 92 mg/dL (ref 0.0–149.0)
VLDL: 18.4 mg/dL (ref 0.0–40.0)

## 2016-10-06 LAB — TSH: TSH: 0.96 u[IU]/mL (ref 0.35–4.50)

## 2016-10-06 LAB — VITAMIN B12: VITAMIN B 12: 286 pg/mL (ref 211–911)

## 2016-10-06 LAB — PSA, MEDICARE: PSA: 1.09 ng/ml (ref 0.10–4.00)

## 2016-10-06 LAB — VITAMIN D 25 HYDROXY (VIT D DEFICIENCY, FRACTURES): VITD: 16.17 ng/mL — ABNORMAL LOW (ref 30.00–100.00)

## 2016-10-06 NOTE — Telephone Encounter (Signed)
Pt was suppose to have a MRI but pt has metal in his neck. He was wondering what the next step is. Please advise pt, [phone] (work number)

## 2016-10-06 NOTE — Telephone Encounter (Signed)
See 08/08/16 c-spine xray. Next OV with you is 02/06/17. Please advise.

## 2016-10-06 NOTE — Telephone Encounter (Signed)
CT of cervical spine has been ordered

## 2016-10-06 NOTE — Telephone Encounter (Signed)
I called pt to advise of below. No answer/unable to leave vm. I will try again later.

## 2016-10-07 NOTE — Telephone Encounter (Signed)
Pt informed

## 2016-10-09 ENCOUNTER — Other Ambulatory Visit: Payer: Self-pay | Admitting: Internal Medicine

## 2016-10-09 MED ORDER — ERGOCALCIFEROL 1.25 MG (50000 UT) PO CAPS: 50000.0000 [IU] | ORAL_CAPSULE | ORAL | 2 refills | Status: DC

## 2016-10-14 ENCOUNTER — Ambulatory Visit
Admission: RE | Admit: 2016-10-14 | Discharge: 2016-10-14 | Disposition: A | Discharge status: Home / Self Care | Payer: Medicare Other | Source: Ambulatory Visit | Attending: Internal Medicine | Admitting: Internal Medicine

## 2016-10-14 DIAGNOSIS — M5031 Other cervical disc degeneration,  high cervical region: Secondary | ICD-10-CM | POA: Diagnosis not present

## 2016-10-14 DIAGNOSIS — M4602 Spinal enthesopathy, cervical region: Secondary | ICD-10-CM | POA: Insufficient documentation

## 2016-10-14 DIAGNOSIS — G5693 Unspecified mononeuropathy of bilateral upper limbs: Secondary | ICD-10-CM | POA: Insufficient documentation

## 2016-10-14 DIAGNOSIS — M5032 Other cervical disc degeneration, mid-cervical region, unspecified level: Secondary | ICD-10-CM | POA: Diagnosis not present

## 2016-10-20 DIAGNOSIS — M9901 Segmental and somatic dysfunction of cervical region: Secondary | ICD-10-CM | POA: Diagnosis not present

## 2016-10-20 DIAGNOSIS — M531 Cervicobrachial syndrome: Secondary | ICD-10-CM | POA: Diagnosis not present

## 2016-10-20 DIAGNOSIS — M545 Low back pain: Secondary | ICD-10-CM | POA: Diagnosis not present

## 2016-10-20 DIAGNOSIS — M9902 Segmental and somatic dysfunction of thoracic region: Secondary | ICD-10-CM | POA: Diagnosis not present

## 2016-10-20 DIAGNOSIS — M9903 Segmental and somatic dysfunction of lumbar region: Secondary | ICD-10-CM | POA: Diagnosis not present

## 2016-10-20 DIAGNOSIS — M546 Pain in thoracic spine: Secondary | ICD-10-CM | POA: Diagnosis not present

## 2016-10-27 ENCOUNTER — Telehealth: Payer: Self-pay

## 2016-10-27 NOTE — Telephone Encounter (Signed)
Tried calling patient to ask about why he has not completed his cologuard. His VM has not been set up.

## 2016-11-17 DIAGNOSIS — M546 Pain in thoracic spine: Secondary | ICD-10-CM | POA: Diagnosis not present

## 2016-11-17 DIAGNOSIS — M9901 Segmental and somatic dysfunction of cervical region: Secondary | ICD-10-CM | POA: Diagnosis not present

## 2016-11-17 DIAGNOSIS — M9903 Segmental and somatic dysfunction of lumbar region: Secondary | ICD-10-CM | POA: Diagnosis not present

## 2016-11-17 DIAGNOSIS — M531 Cervicobrachial syndrome: Secondary | ICD-10-CM | POA: Diagnosis not present

## 2016-11-17 DIAGNOSIS — M9902 Segmental and somatic dysfunction of thoracic region: Secondary | ICD-10-CM | POA: Diagnosis not present

## 2016-11-17 DIAGNOSIS — M545 Low back pain: Secondary | ICD-10-CM | POA: Diagnosis not present

## 2016-12-15 DIAGNOSIS — M546 Pain in thoracic spine: Secondary | ICD-10-CM | POA: Diagnosis not present

## 2016-12-15 DIAGNOSIS — M9902 Segmental and somatic dysfunction of thoracic region: Secondary | ICD-10-CM | POA: Diagnosis not present

## 2016-12-15 DIAGNOSIS — M9903 Segmental and somatic dysfunction of lumbar region: Secondary | ICD-10-CM | POA: Diagnosis not present

## 2016-12-15 DIAGNOSIS — M545 Low back pain: Secondary | ICD-10-CM | POA: Diagnosis not present

## 2016-12-15 DIAGNOSIS — M9901 Segmental and somatic dysfunction of cervical region: Secondary | ICD-10-CM | POA: Diagnosis not present

## 2016-12-15 DIAGNOSIS — M531 Cervicobrachial syndrome: Secondary | ICD-10-CM | POA: Diagnosis not present

## 2016-12-31 IMAGING — DX DG CERVICAL SPINE COMPLETE 4+V
6 series · 6 of 6 positions shown · non-contrast
Comparison: None in PACs

CLINICAL DATA: Bilateral arm numbness intermittently with posterior
neck pain.

EXAM:
CERVICAL SPINE - COMPLETE 4+ VIEW

[cervical spine ap]
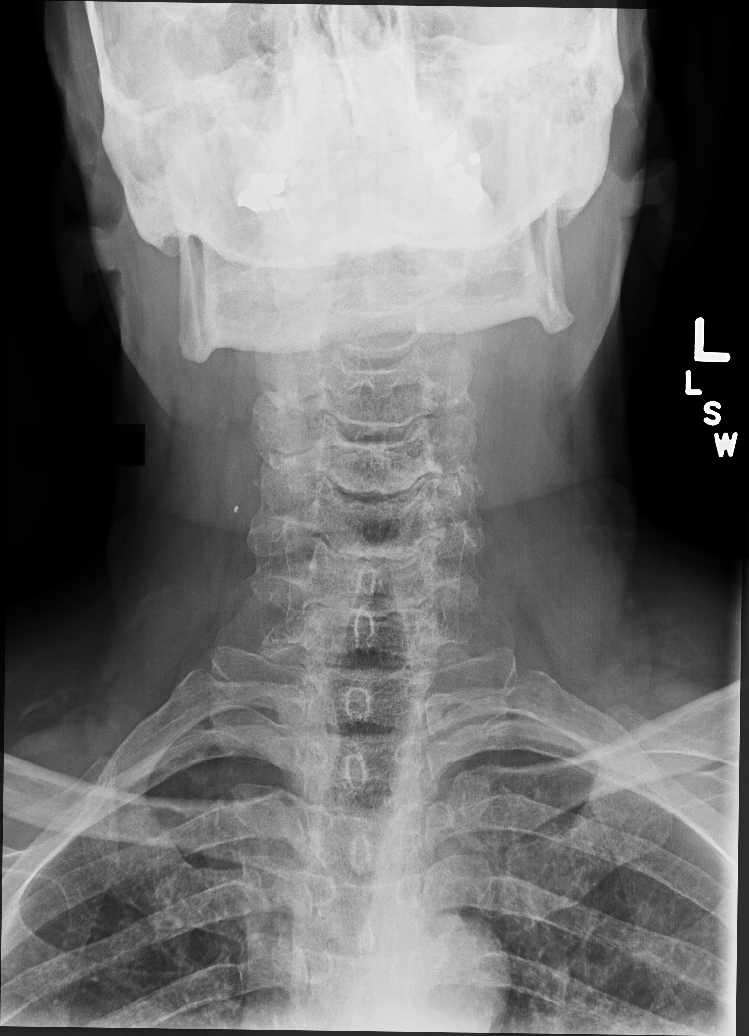

[cervical spine oblique (1 of 2)]
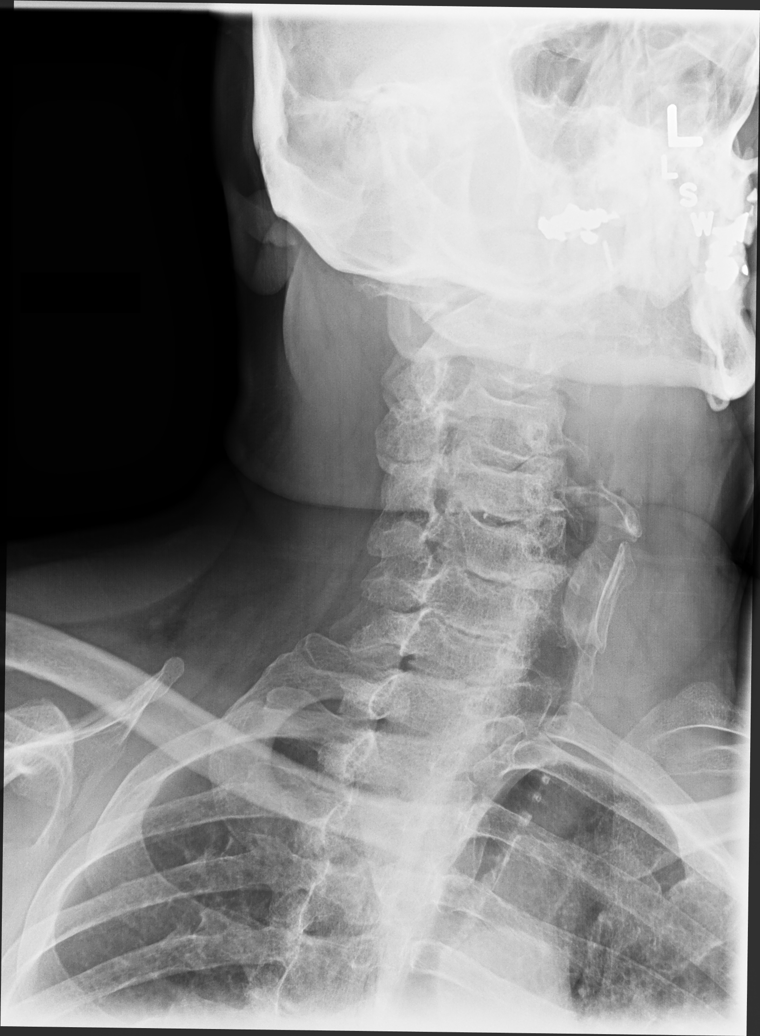

[cervical spine oblique (2 of 2)]
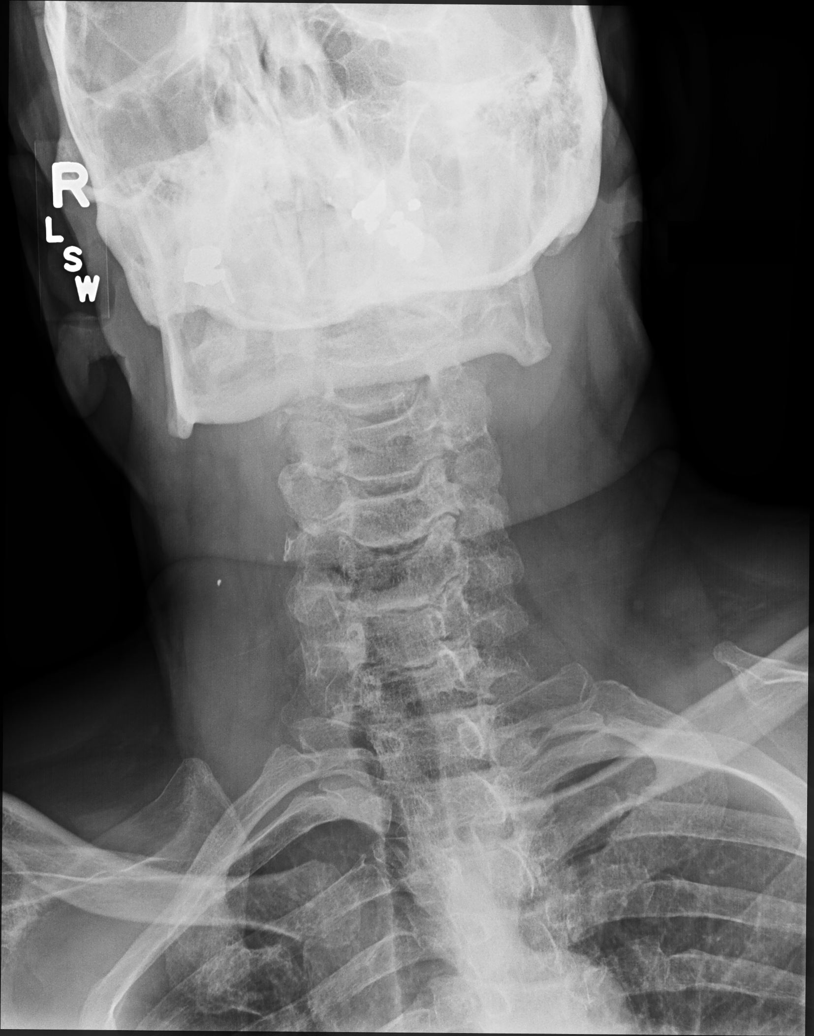

[cervical spine lat]
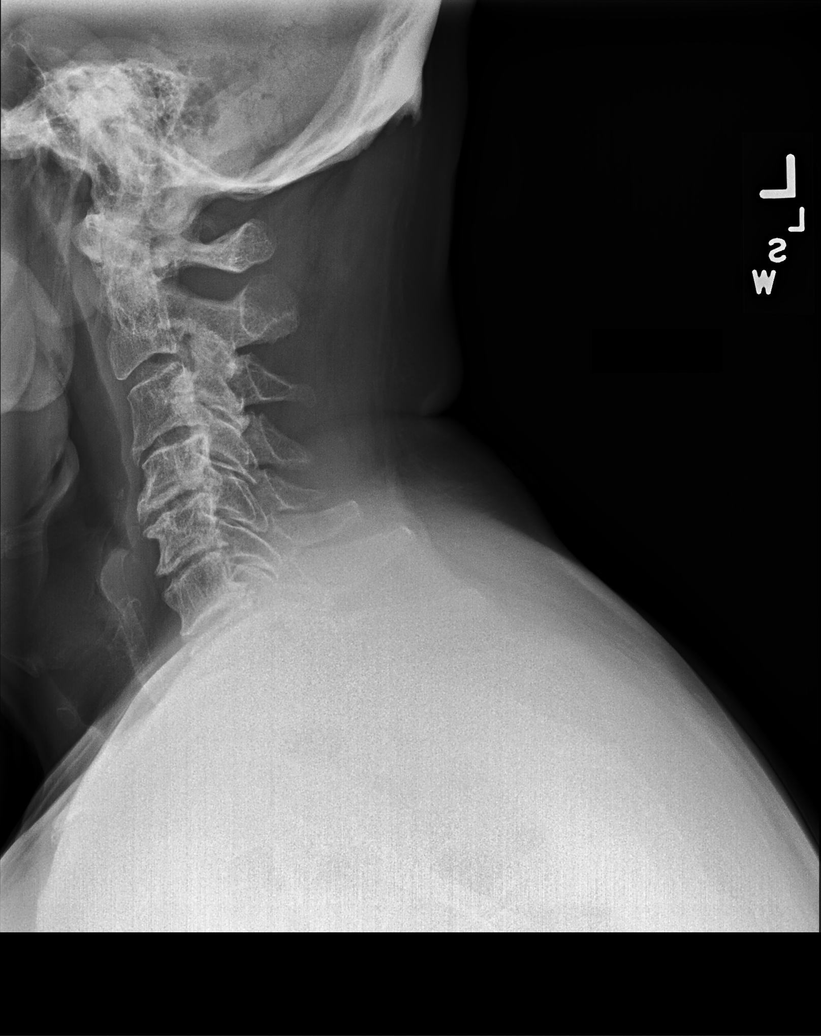

[swimmers lat]
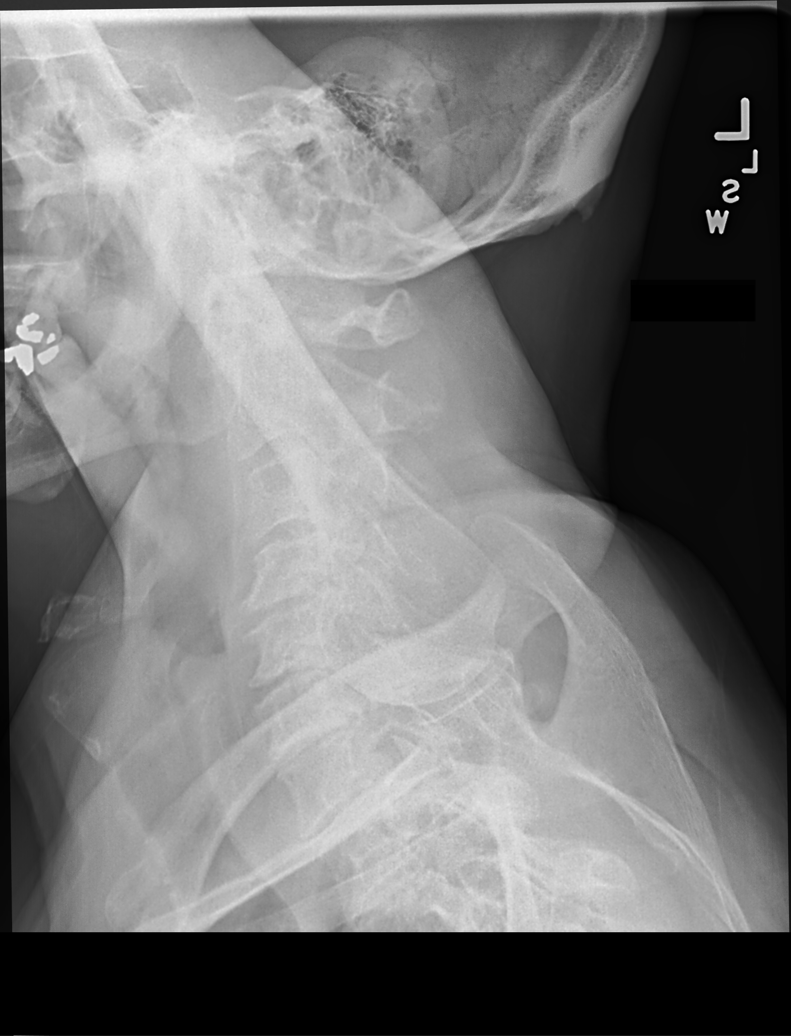

[cervical spine open mouth ap]
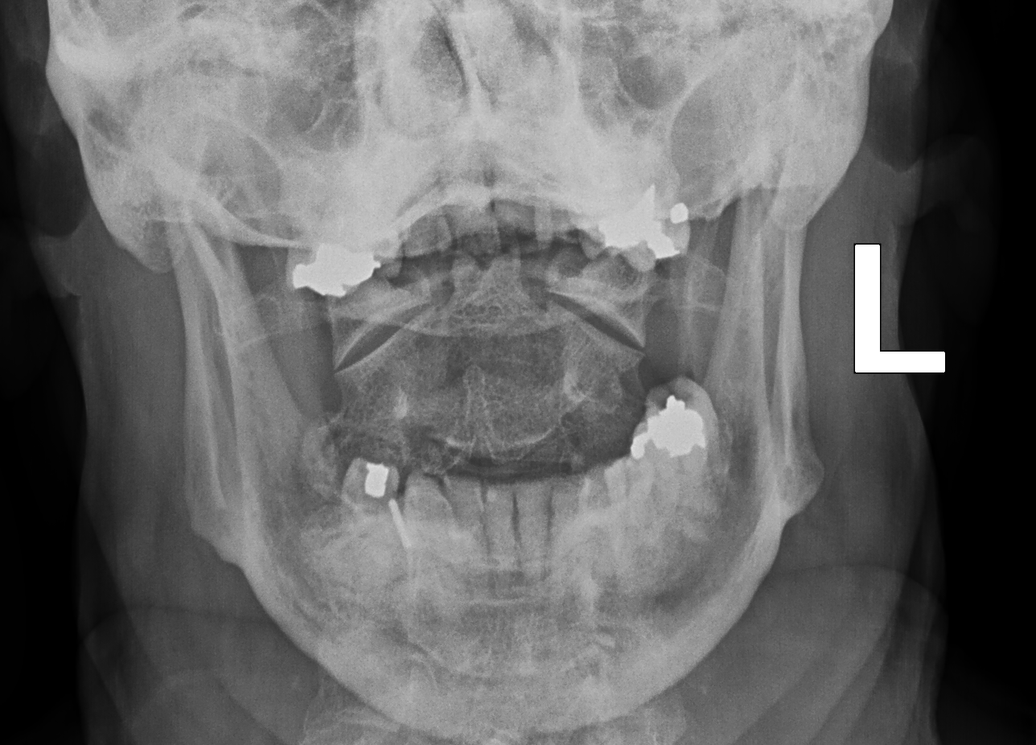

[6 of 6 positions shown; findings below may reference images not displayed]

FINDINGS: The cervical vertebral bodies are preserved in height. There is disc
space narrowing from C3-4 inferiorly. There are anterior and
posterior endplate osteophytes from C3 inferiorly. There is facet
joint hypertrophy in the upper thoracic spine. No perched facet is
observed. The spinous processes are intact. The odontoid is intact.
The oblique views are suboptimally positioned and limit evaluation
of the neural foramina. The prevertebral soft tissue spaces are
normal. There is a metallic foreign body in the soft tissues of the
right mid neck laterally.
IMPRESSION: Multilevel degenerative disc and facet joint change. There is likely
some bony encroachment upon the neural foramina bilaterally but the
neural foramina are not well evaluated on today's study.

2 mm diameter metallic foreign body in the soft tissues of the mid
neck on the right. Is there a history of previous penetrating
injury?

## 2017-01-12 DIAGNOSIS — M9903 Segmental and somatic dysfunction of lumbar region: Secondary | ICD-10-CM | POA: Diagnosis not present

## 2017-01-12 DIAGNOSIS — M545 Low back pain: Secondary | ICD-10-CM | POA: Diagnosis not present

## 2017-01-12 DIAGNOSIS — M9902 Segmental and somatic dysfunction of thoracic region: Secondary | ICD-10-CM | POA: Diagnosis not present

## 2017-01-12 DIAGNOSIS — M546 Pain in thoracic spine: Secondary | ICD-10-CM | POA: Diagnosis not present

## 2017-01-12 DIAGNOSIS — M9901 Segmental and somatic dysfunction of cervical region: Secondary | ICD-10-CM | POA: Diagnosis not present

## 2017-01-12 DIAGNOSIS — M531 Cervicobrachial syndrome: Secondary | ICD-10-CM | POA: Diagnosis not present

## 2017-02-06 ENCOUNTER — Encounter: Payer: Self-pay | Admitting: Internal Medicine

## 2017-02-06 ENCOUNTER — Ambulatory Visit (INDEPENDENT_AMBULATORY_CARE_PROVIDER_SITE_OTHER): Payer: Medicare Other | Admitting: Internal Medicine

## 2017-02-06 VITALS — BP 126/78 | HR 68 | Temp 97.5°F | Resp 15 | Ht 68.0 in | Wt 213.4 lb

## 2017-02-06 DIAGNOSIS — H905 Unspecified sensorineural hearing loss: Secondary | ICD-10-CM

## 2017-02-06 DIAGNOSIS — I5030 Unspecified diastolic (congestive) heart failure: Secondary | ICD-10-CM | POA: Insufficient documentation

## 2017-02-06 DIAGNOSIS — R079 Chest pain, unspecified: Secondary | ICD-10-CM

## 2017-02-06 DIAGNOSIS — I10 Essential (primary) hypertension: Secondary | ICD-10-CM

## 2017-02-06 DIAGNOSIS — Z125 Encounter for screening for malignant neoplasm of prostate: Secondary | ICD-10-CM | POA: Diagnosis not present

## 2017-02-06 DIAGNOSIS — H903 Sensorineural hearing loss, bilateral: Secondary | ICD-10-CM

## 2017-02-06 DIAGNOSIS — R0609 Other forms of dyspnea: Secondary | ICD-10-CM | POA: Diagnosis not present

## 2017-02-06 DIAGNOSIS — G5693 Unspecified mononeuropathy of bilateral upper limbs: Secondary | ICD-10-CM

## 2017-02-06 DIAGNOSIS — E785 Hyperlipidemia, unspecified: Secondary | ICD-10-CM | POA: Diagnosis not present

## 2017-02-06 MED ORDER — LOSARTAN POTASSIUM-HCTZ 100-12.5 MG PO TABS: 1.0000 | ORAL_TABLET | Freq: Every day | ORAL | 1 refills | Status: DC

## 2017-02-06 MED ORDER — CITALOPRAM HYDROBROMIDE 20 MG PO TABS: 20.0000 mg | ORAL_TABLET | Freq: Every day | ORAL | 1 refills | Status: DC

## 2017-02-06 NOTE — Assessment & Plan Note (Signed)
12 lead ekg done today is NSR with no ischemic changes, by my exam.  Referral to Cardiology for evaluation

## 2017-02-06 NOTE — Patient Instructions (Signed)
PI have re ordered the Cologuard screening test  I am referring you to Cardiology for a heart evaluation  Return in August for fasting labs  I'll see you again in 6 months    Health Maintenance, Male A healthy lifestyle and preventive care is important for your health and wellness. Ask your health care provider about what schedule of regular examinations is right for you. What should I know about weight and diet? Eat a Healthy Diet  Eat plenty of vegetables, fruits, whole grains, low-fat dairy products, and lean protein.  Do not eat a lot of foods high in solid fats, added sugars, or salt.  Maintain a Healthy Weight Regular exercise can help you achieve or maintain a healthy weight. You should:  Do at least 150 minutes of exercise each week. The exercise should increase your heart rate and make you sweat (moderate-intensity exercise).  Do strength-training exercises at least twice a week.  Watch Your Levels of Cholesterol and Blood Lipids  Have your blood tested for lipids and cholesterol every 5 years starting at 71 years of age. If you are at high risk for heart disease, you should start having your blood tested when you are 71 years old. You may need to have your cholesterol levels checked more often if: ? Your lipid or cholesterol levels are high. ? You are older than 71 years of age. ? You are at high risk for heart disease.  What should I know about cancer screening? Many types of cancers can be detected early and may often be prevented. Lung Cancer  You should be screened every year for lung cancer if: ? You are a current smoker who has smoked for at least 30 years. ? You are a former smoker who has quit within the past 15 years.  Talk to your health care provider about your screening options, when you should start screening, and how often you should be screened.  Colorectal Cancer  Routine colorectal cancer screening usually begins at 71 years of age and should be  repeated every 5-10 years until you are 71 years old. You may need to be screened more often if early forms of precancerous polyps or small growths are found. Your health care provider may recommend screening at an earlier age if you have risk factors for colon cancer.  Your health care provider may recommend using home test kits to check for hidden blood in the stool.  A small camera at the end of a tube can be used to examine your colon (sigmoidoscopy or colonoscopy). This checks for the earliest forms of colorectal cancer.  Prostate and Testicular Cancer  Depending on your age and overall health, your health care provider may do certain tests to screen for prostate and testicular cancer.  Talk to your health care provider about any symptoms or concerns you have about testicular or prostate cancer.  Skin Cancer  Check your skin from head to toe regularly.  Tell your health care provider about any new moles or changes in moles, especially if: ? There is a change in a mole's size, shape, or color. ? You have a mole that is larger than a pencil eraser.  Always use sunscreen. Apply sunscreen liberally and repeat throughout the day.  Protect yourself by wearing long sleeves, pants, a wide-brimmed hat, and sunglasses when outside.  What should I know about heart disease, diabetes, and high blood pressure?  If you are 37-62 years of age, have your blood pressure checked every  3-5 years. If you are 75 years of age or older, have your blood pressure checked every year. You should have your blood pressure measured twice-once when you are at a hospital or clinic, and once when you are not at a hospital or clinic. Record the average of the two measurements. To check your blood pressure when you are not at a hospital or clinic, you can use: ? An automated blood pressure machine at a pharmacy. ? A home blood pressure monitor.  Talk to your health care provider about your target blood  pressure.  If you are between 75-71 years old, ask your health care provider if you should take aspirin to prevent heart disease.  Have regular diabetes screenings by checking your fasting blood sugar level. ? If you are at a normal weight and have a low risk for diabetes, have this test once every three years after the age of 77. ? If you are overweight and have a high risk for diabetes, consider being tested at a younger age or more often.  A one-time screening for abdominal aortic aneurysm (AAA) by ultrasound is recommended for men aged 25-75 years who are current or former smokers. What should I know about preventing infection? Hepatitis B If you have a higher risk for hepatitis B, you should be screened for this virus. Talk with your health care provider to find out if you are at risk for hepatitis B infection. Hepatitis C Blood testing is recommended for:  Everyone born from [birth year] through 1965.  Anyone with known risk factors for hepatitis C.  Sexually Transmitted Diseases (STDs)  You should be screened each year for STDs including gonorrhea and chlamydia if: ? You are sexually active and are younger than 71 years of age. ? You are older than 71 years of age and your health care provider tells you that you are at risk for this type of infection. ? Your sexual activity has changed since you were last screened and you are at an increased risk for chlamydia or gonorrhea. Ask your health care provider if you are at risk.  Talk with your health care provider about whether you are at high risk of being infected with HIV. Your health care provider may recommend a prescription medicine to help prevent HIV infection.  What else can I do?  Schedule regular health, dental, and eye exams.  Stay current with your vaccines (immunizations).  Do not use any tobacco products, such as cigarettes, chewing tobacco, and e-cigarettes. If you need help quitting, ask your health care  provider.  Limit alcohol intake to no more than 2 drinks per day. One drink equals 12 ounces of beer, 5 ounces of wine, or 1 ounces of hard liquor.  Do not use street drugs.  Do not share needles.  Ask your health care provider for help if you need support or information about quitting drugs.  Tell your health care provider if you often feel depressed.  Tell your health care provider if you have ever been abused or do not feel safe at home. This information is not intended to replace advice given to you by your health care provider. Make sure you discuss any questions you have with your health care provider. Document Released: 02/18/2008 Document Revised: 04/20/2016 Document Reviewed: 05/26/2015 Elsevier Interactive Patient Education  [employer].

## 2017-02-06 NOTE — Progress Notes (Signed)
Subjective:  Patient ID: Austin Duran, male    DOB: December 15, 1945  Age: 71 y.o. MRN: [id number]  CC: The primary encounter diagnosis was Chest pain, unspecified type. Diagnoses of Dyspnea on exertion, Screening for prostate cancer, Dyslipidemia with elevated low density lipoprotein cholesterol and abnormally low high density lipoprotein cholesterol, Essential hypertension, Bilateral neuropathy of upper extremities, Hearing loss, sensorineural, asymmetrical, and Exertional dyspnea were also pertinent to this visit.  HPI Austin Duran presents for follow up on multiple issues including impaired fasting glucose,  GAD, hypertension and obesity.  Trying to lose weight by reducing his intake   stiill working fulll time,  Neurosurgeon.  Some loss of hearing .  Does not want audiology referral   Low back pain with occasional radiation to either leg, not constant.  No foot drop    Has noticed that he gets very short of breath with pushing a lawnmower  70 to 80  Feet.  3 weeks ago last episodes   . Takes him a while to catch his breath,  Denies chest  Pain. Has also been having episodes  of chest pain with  Radiation to left jaw and left ear ,  ocurring at rest.  No prior wrorkup, last episode occurred a couple of weeks ago .  Lab Results  Component Value Date   PSA 1.09 10/06/2016   PSA 0.92 07/16/2015   PSA 0.96 07/10/2014     Lab Results  Component Value Date   HGBA1C 5.7 10/06/2016     Outpatient Medications Prior to Visit  Medication Sig Dispense Refill  . aspirin 81 MG tablet Take 81 mg by mouth daily.      . Cholecalciferol (VITAMIN D-3 PO) Take 1 tablet by mouth daily.    . citalopram (CELEXA) 20 MG tablet Take 1 tablet (20 mg total) by mouth daily. 90 tablet 1  . losartan-hydrochlorothiazide (HYZAAR) 100-12.5 MG tablet Take 1 tablet by mouth daily. 90 tablet 1  . ergocalciferol (DRISDOL) 50000 units capsule Take 1 capsule (50,000 Units total) by mouth once a week. (Patient  not taking: Reported on 02/06/2017) 4 capsule 2  . mupirocin ointment (BACTROBAN) 2 % Apply 1 application topically 3 (three) times daily. (Patient not taking: Reported on 08/08/2016) 22 g 0   No facility-administered medications prior to visit.     Review of Systems;  Patient denies headache, fevers, malaise, unintentional weight loss, skin rash, eye pain, sinus congestion and sinus pain, sore throat, dysphagia,  hemoptysis , cough,  wheezing, , palpitations, orthopnea, edema, abdominal pain, nausea, melena, diarrhea, constipation, flank pain, dysuria, hematuria, urinary  Frequency, nocturia, numbness, tingling, seizures,  Focal weakness, Loss of consciousness,  Tremor, insomnia, depression, anxiety, and suicidal ideation.      Objective:  BP 126/78 (BP Location: Left Arm, Patient Position: Sitting, Cuff Size: Normal)   Pulse 68   Temp 97.5 F (36.4 C) (Oral)   Resp 15   Ht 5\' 8"  (1.727 m)   Wt 213 lb 6.4 oz (96.8 kg)   SpO2 95%   BMI 32.45 kg/m   BP Readings from Last 3 Encounters:  02/06/17 126/78  08/08/16 140/82  07/15/16 (!) 142/82    Wt Readings from Last 3 Encounters:  02/06/17 213 lb 6.4 oz (96.8 kg)  08/08/16 209 lb 8 oz (95 kg)  07/15/16 218 lb (98.9 kg)    General appearance: alert, cooperative and appears stated age Ears: normal TM's and external ear canals both ears Throat: lips, mucosa,  and tongue normal; teeth and gums normal Neck: no adenopathy, no carotid bruit, supple, symmetrical, trachea midline and thyroid not enlarged, symmetric, no tenderness/mass/nodules Back: symmetric, no curvature. ROM normal. No CVA tenderness. Lungs: clear to auscultation bilaterally Heart: regular rate and rhythm, S1, S2 normal, no murmur, click, rub or gallop Abdomen: soft, non-tender; bowel sounds normal; no masses,  no organomegaly Pulses: 2+ and symmetric Skin: Skin color, texture, turgor normal. No rashes or lesions Lymph nodes: Cervical, supraclavicular, and axillary  nodes normal.  Lab Results  Component Value Date   HGBA1C 5.7 10/06/2016   HGBA1C 5.7 04/26/2016   HGBA1C 5.7 07/16/2015    Lab Results  Component Value Date   CREATININE 1.16 10/06/2016   CREATININE 1.07 04/26/2016   CREATININE 1.07 07/16/2015    Lab Results  Component Value Date   WBC 7.4 10/06/2016   HGB 15.4 10/06/2016   HCT 45.2 10/06/2016   PLT 256.0 10/06/2016   GLUCOSE 111 (H) 10/06/2016   CHOL 141 10/06/2016   TRIG 92.0 10/06/2016   HDL 32.80 (L) 10/06/2016   LDLCALC 90 10/06/2016   ALT 16 10/06/2016   AST 12 10/06/2016   NA 139 10/06/2016   K 4.6 10/06/2016   CL 104 10/06/2016   CREATININE 1.16 10/06/2016   BUN 18 10/06/2016   CO2 31 10/06/2016   TSH 0.96 10/06/2016   PSA 1.09 10/06/2016   HGBA1C 5.7 10/06/2016    Ct Cervical Spine Wo Contrast  Result Date: 10/14/2016 CLINICAL DATA:  BILATERAL hand and arm numbness and tingling intermittently for over year, up to 7 episodes in a 2 days band, BILATERAL upper extremity neuropathy, unable to have MRI EXAM: CT CERVICAL SPINE WITHOUT CONTRAST TECHNIQUE: Multidetector CT imaging of the cervical spine was performed without intravenous contrast. Multiplanar CT image reconstructions were also generated. COMPARISON:  Cervical spine radiographs 08/08/2016 FINDINGS: Alignment: Minimal retrolisthesis at C4-C5 and C5-C6. Remaining alignments normal Skull base and vertebrae: Skullbase intact. Vertebral body heights maintained without fracture or bone destruction. Multilevel facet degenerative changes bilaterally. Soft tissues and spinal canal: Prevertebral soft tissues normal thickness. Cervical soft tissues otherwise normal appearance. Disc levels: Multilevel disc space narrowing and endplate spur formation. Encroachment upon BILATERAL cervical neural foramina at multiple levels by uncovertebral spurs from C3-C4 through C6-C7 bilaterally. Upper chest: Lung apices clear Other: N/A IMPRESSION: Significant multilevel degenerative  disc and facet disease changes of the cervical spine as above with multi level neural foraminal encroachments by uncovertebral spurs bilaterally from C3-C4 through C6-C7. Electronically Signed   By: Lavonia Dana M.D.   On: 10/14/2016 14:25    Assessment & Plan:   Problem List Items Addressed This Visit    Screening for prostate cancer   Hypertension    Well controlled on current regimen. Renal function stable, no changes today.      Relevant Medications   losartan-hydrochlorothiazide (HYZAAR) 100-12.5 MG tablet   Other Relevant Orders   Comprehensive metabolic panel   Hearing loss, sensorineural, asymmetrical    Occupational,  Testing deferred again      Exertional dyspnea    12 lead ekg done today is NSR with no ischemic changes, by my exam.  Referral to Cardiology for evaluation       Dyslipidemia with elevated low density lipoprotein cholesterol and abnormally low high density lipoprotein cholesterol    LDL and triglycerides are at goal on current medications. He has no side effects and liver enzymes are normal. No changes today  Lab Results  Component Value  Date   CHOL 141 10/06/2016   HDL 32.80 (L) 10/06/2016   LDLCALC 90 10/06/2016   TRIG 92.0 10/06/2016   CHOLHDL 4 10/06/2016    Lab Results  Component Value Date   ALT 16 10/06/2016   AST 12 10/06/2016   ALKPHOS 65 10/06/2016   BILITOT 0.9 10/06/2016               Relevant Orders   Lipid panel   Bilateral neuropathy of upper extremities    Secondary to cervical spine degenerative changes causing encroachment on neural foramina bilaterally at mulitple levels by Feb 2018 CT.      Relevant Medications   citalopram (CELEXA) 20 MG tablet    Other Visit Diagnoses    Chest pain, unspecified type    -  Primary   Relevant Orders   EKG 12-Lead (Completed)   Ambulatory referral to Cardiology   Dyspnea on exertion       Relevant Orders   Ambulatory referral to Cardiology      I have discontinued Mr.  Lockridge mupirocin ointment and ergocalciferol. I am also having him maintain his aspirin, Cholecalciferol (VITAMIN D-3 PO), citalopram, and losartan-hydrochlorothiazide.  Meds ordered this encounter  Medications  . citalopram (CELEXA) 20 MG tablet    Sig: Take 1 tablet (20 mg total) by mouth daily.    Dispense:  90 tablet    Refill:  1  . losartan-hydrochlorothiazide (HYZAAR) 100-12.5 MG tablet    Sig: Take 1 tablet by mouth daily.    Dispense:  90 tablet    Refill:  1    Medications Discontinued During This Encounter  Medication Reason  . ergocalciferol (DRISDOL) 50000 units capsule Therapy completed  . mupirocin ointment (BACTROBAN) 2 % Patient has not taken in last 30 days  . citalopram (CELEXA) 20 MG tablet Reorder  . losartan-hydrochlorothiazide (HYZAAR) 100-12.5 MG tablet Reorder    Follow-up: No Follow-up on file.   [name], MD

## 2017-02-06 NOTE — Assessment & Plan Note (Signed)
Well controlled on current regimen. Renal function stable, no changes today. 

## 2017-02-06 NOTE — Assessment & Plan Note (Signed)
Secondary to cervical spine degenerative changes causing encroachment on neural foramina bilaterally at mulitple levels by Feb 2018 CT.

## 2017-02-06 NOTE — Assessment & Plan Note (Signed)
LDL and triglycerides are at goal on current medications. He has no side effects and liver enzymes are normal. No changes today  Lab Results  Component Value Date   CHOL 141 10/06/2016   HDL 32.80 (L) 10/06/2016   LDLCALC 90 10/06/2016   TRIG 92.0 10/06/2016   CHOLHDL 4 10/06/2016    Lab Results  Component Value Date   ALT 16 10/06/2016   AST 12 10/06/2016   ALKPHOS 65 10/06/2016   BILITOT 0.9 10/06/2016

## 2017-02-06 NOTE — Assessment & Plan Note (Signed)
Occupational,  Testing deferred again

## 2017-02-07 ENCOUNTER — Encounter: Payer: Self-pay | Admitting: Cardiovascular Disease

## 2017-02-09 ENCOUNTER — Telehealth: Payer: Self-pay

## 2017-02-09 NOTE — Telephone Encounter (Signed)
cologuard order has been refaxed.

## 2017-02-09 NOTE — Telephone Encounter (Signed)
-----   Message from [name], MD sent at 02/06/2017  9:07 AM EDT ----- Needs cologuard re sent

## 2017-02-13 DIAGNOSIS — L859 Epidermal thickening, unspecified: Secondary | ICD-10-CM | POA: Diagnosis not present

## 2017-02-13 DIAGNOSIS — L739 Follicular disorder, unspecified: Secondary | ICD-10-CM | POA: Diagnosis not present

## 2017-02-13 DIAGNOSIS — B359 Dermatophytosis, unspecified: Secondary | ICD-10-CM | POA: Diagnosis not present

## 2017-02-13 DIAGNOSIS — L578 Other skin changes due to chronic exposure to nonionizing radiation: Secondary | ICD-10-CM | POA: Diagnosis not present

## 2017-02-13 DIAGNOSIS — Z85828 Personal history of other malignant neoplasm of skin: Secondary | ICD-10-CM | POA: Diagnosis not present

## 2017-02-13 DIAGNOSIS — L720 Epidermal cyst: Secondary | ICD-10-CM | POA: Diagnosis not present

## 2017-02-13 DIAGNOSIS — D485 Neoplasm of uncertain behavior of skin: Secondary | ICD-10-CM | POA: Diagnosis not present

## 2017-02-13 DIAGNOSIS — L821 Other seborrheic keratosis: Secondary | ICD-10-CM | POA: Diagnosis not present

## 2017-02-13 DIAGNOSIS — D1800 Hemangioma unspecified site: Secondary | ICD-10-CM | POA: Diagnosis not present

## 2017-02-16 DIAGNOSIS — M9903 Segmental and somatic dysfunction of lumbar region: Secondary | ICD-10-CM | POA: Diagnosis not present

## 2017-02-16 DIAGNOSIS — M9901 Segmental and somatic dysfunction of cervical region: Secondary | ICD-10-CM | POA: Diagnosis not present

## 2017-02-16 DIAGNOSIS — M9902 Segmental and somatic dysfunction of thoracic region: Secondary | ICD-10-CM | POA: Diagnosis not present

## 2017-02-16 DIAGNOSIS — M546 Pain in thoracic spine: Secondary | ICD-10-CM | POA: Diagnosis not present

## 2017-02-16 DIAGNOSIS — M545 Low back pain: Secondary | ICD-10-CM | POA: Diagnosis not present

## 2017-02-16 DIAGNOSIS — M531 Cervicobrachial syndrome: Secondary | ICD-10-CM | POA: Diagnosis not present

## 2017-02-22 NOTE — Progress Notes (Signed)
Cardiology Office Note   Date:  02/23/2017   ID:  Austin Duran, DOB Aug 12, 1946, MRN [id number]  PCP:  [name], MD  Cardiologist:   Jenkins Rouge, MD   Chief Complaint  Patient presents with  . Chest Pain  . Shortness of Breath      History of Present Illness: Austin Duran is a 71 y.o. male who presents for consultation regarding chest pain and new onset exertional dyspnea. Referred by Dr Derrel Nip in [place] Reviewed her office note from 02/06/17.  HIstory of HTN, Obesity, Glucose Intolerance For last month has had exertional dyspnea especially pushing lawn mower SSCP radiating to jaw and left ear. Episodes not always exertional and not every week   Labs reveiwed LDL 90 10/06/16   He works Chartered certified accountant Has some cervical neuropathy RX with Celexa  No previously documented vascular disease Has not had recent CXR, or Echo  He indicates dyspnea worse when he pushes or lifts things Tends to hold his breath Indicates tightness and dyspnea not really acute issues been going on /building for years   Past Medical History:  Diagnosis Date  . Depression   . Hearing loss    had a perforated right TM,   followed by Dr. Tami Ribas  . Hypertension   . Panic attack   . Panic attacks     Past Surgical History:  Procedure Laterality Date  . CATARACT EXTRACTION Bilateral Aug 2013   [facility]  . CATARACT EXTRACTION, BILATERAL  May 2013   [facility]filio  . EYE SURGERY  2008   eyelid surgery, Dr. Loletta Specter  . FRACTURE SURGERY    . [facility] , right      Current Outpatient Prescriptions  Medication Sig Dispense Refill  . aspirin 81 MG tablet Take 81 mg by mouth daily.      . citalopram (CELEXA) 20 MG tablet Take 1 tablet (20 mg total) by mouth daily. 90 tablet 1  . losartan-hydrochlorothiazide (HYZAAR) 100-12.5 MG tablet Take 1 tablet by mouth daily. 90 tablet 1  . Cholecalciferol (VITAMIN D-3 PO) Take 1 tablet by mouth daily.     No  current facility-administered medications for this visit.     Allergies:   Augmentin [amoxicillin-pot clavulanate] and Lisinopril    Social History:  The patient  reports that he quit smoking about 12 years ago. He has never used smokeless tobacco. He reports that he drinks about 2.5 oz of alcohol per week . He reports that he does not use drugs.   Family History:  The patient's family history includes Cancer in his maternal aunt and paternal uncle.    ROS:  Please see the history of present illness.   Otherwise, review of systems are positive for none.   All other systems are reviewed and negative.    PHYSICAL EXAM: VS:  BP (!) 142/80   Pulse 78   Ht 5\' 8"  (1.727 m)   Wt 95.9 kg (211 lb 6.4 oz)   BMI 32.14 kg/m  , BMI Body mass index is 32.14 kg/m. Affect appropriate Overweight white male  HEENT: normal Neck supple with no adenopathy JVP normal no bruits no thyromegaly Lungs clear with no wheezing and good diaphragmatic motion Heart:  S1/S2 no murmur, no rub, gallop or click PMI normal Abdomen: benighn, BS positve, no tenderness, no AAA no bruit.  No HSM or HJR Distal pulses intact with no bruits No edema Neuro non-focal Skin  warm and dry No muscular weakness    EKG:   02/06/17 SR rate 66 normal  02/23/17  SR rate 78 PAC otherwise normal    Recent Labs: 10/06/2016: ALT 16; BUN 18; Creatinine, Ser 1.16; Hemoglobin 15.4; Platelets 256.0; Potassium 4.6; Sodium 139; TSH 0.96    Lipid Panel    Component Value Date/Time   CHOL 141 10/06/2016 0928   TRIG 92.0 10/06/2016 0928   HDL 32.80 (L) 10/06/2016 0928   CHOLHDL 4 10/06/2016 0928   VLDL 18.4 10/06/2016 0928   LDLCALC 90 10/06/2016 0928      Wt Readings from Last 3 Encounters:  02/23/17 95.9 kg (211 lb 6.4 oz)  02/06/17 96.8 kg (213 lb 6.4 oz)  08/08/16 95 kg (209 lb 8 oz)      Other studies Reviewed: Additional studies/ records that were reviewed today include: Notes DR [facility] and ECG  .    ASSESSMENT AND PLAN:  1.  Chest Pain atypical normal ECG f/u ETT at [place] 2. Dyspnea likely functional Echo and CXR ordered exam benign 3. HtN Well controlled.  Continue current medications and low sodium Dash type diet.  \ 4. Cervical Neuropathy f/u neuro no motor deficits     Current medicines are reviewed at length with the patient today.  The patient does not have concerns regarding medicines.  The following changes have been made:  no change  Labs/ tests ordered today include: ETT Echo CXR   Orders Placed This Encounter  Procedures  . DG Chest 2 View  . EXERCISE TOLERANCE TEST  . EKG 12-Lead  . ECHOCARDIOGRAM COMPLETE     Disposition:   FU with Dr Saunders Revel in [place] as it is much more convenient for patient      Signed, Jenkins Rouge, MD  02/23/2017 3:48 PM    [address], [place], [place]  [address] Phone: [phone]; Fax: [phone]

## 2017-02-23 ENCOUNTER — Encounter (INDEPENDENT_AMBULATORY_CARE_PROVIDER_SITE_OTHER): Payer: Self-pay

## 2017-02-23 ENCOUNTER — Ambulatory Visit (INDEPENDENT_AMBULATORY_CARE_PROVIDER_SITE_OTHER): Payer: Medicare Other | Admitting: Cardiovascular Disease

## 2017-02-23 ENCOUNTER — Encounter: Payer: Self-pay | Admitting: Cardiovascular Disease

## 2017-02-23 VITALS — BP 142/80 | HR 78 | Ht 68.0 in | Wt 211.4 lb

## 2017-02-23 DIAGNOSIS — R0602 Shortness of breath: Secondary | ICD-10-CM | POA: Diagnosis not present

## 2017-02-23 DIAGNOSIS — R079 Chest pain, unspecified: Secondary | ICD-10-CM | POA: Diagnosis not present

## 2017-02-23 NOTE — Patient Instructions (Addendum)
Medication Instructions:  Your physician recommends that you continue on your current medications as directed. Please refer to the Current Medication list given to you today.  Labwork: NONE  Testing/Procedures: Your physician has requested that you have an exercise tolerance test in [place]. For further information please visit [email or web address]. Please also follow instruction sheet, as given.  Your physician has requested that you have an echocardiogram in [place]. Echocardiography is a painless test that uses sound waves to create images of your heart. It provides your doctor with information about the size and shape of your heart and how well your heart's chambers and valves are working. This procedure takes approximately one hour. There are no restrictions for this procedure.  A chest x-ray in [place] takes a picture of the organs and structures inside the chest, including the heart, lungs, and blood vessels. This test can show several things, including, whether the heart is enlarges; whether fluid is building up in the lungs; and whether pacemaker / defibrillator leads are still in place.  Follow-Up: Your physician wants you to follow-up with Dr. Saunders Revel after test are complete. You will receive a reminder letter in the mail two months in advance. If you don't receive a letter, please call our office to schedule the follow-up appointment.   If you need a refill on your cardiac medications before your next appointment, please call your pharmacy.

## 2017-03-01 ENCOUNTER — Telehealth: Payer: Self-pay | Admitting: *Deleted

## 2017-03-01 NOTE — Telephone Encounter (Signed)
Patient verbalized understanding of exercise stress test tomorrow at 0930 am and where the office is located.

## 2017-03-02 ENCOUNTER — Ambulatory Visit (INDEPENDENT_AMBULATORY_CARE_PROVIDER_SITE_OTHER): Payer: Medicare Other

## 2017-03-02 ENCOUNTER — Telehealth: Payer: Self-pay | Admitting: *Deleted

## 2017-03-02 DIAGNOSIS — R0602 Shortness of breath: Secondary | ICD-10-CM | POA: Diagnosis not present

## 2017-03-02 DIAGNOSIS — R079 Chest pain, unspecified: Secondary | ICD-10-CM | POA: Diagnosis not present

## 2017-03-02 LAB — EXERCISE TOLERANCE TEST
CHL CUP MPHR: 150 {beats}/min
CSEPEW: 5.1 METS
CSEPPHR: 118 {beats}/min
Exercise duration (min): 3 min
Exercise duration (sec): 26 s
Percent HR: 78 %
Rest HR: 74 {beats}/min

## 2017-03-02 MED ORDER — DILTIAZEM HCL ER COATED BEADS 120 MG PO CP24: 120.0000 mg | ORAL_CAPSULE | Freq: Every day | ORAL | 3 refills | Status: DC

## 2017-03-02 MED ORDER — LOSARTAN POTASSIUM-HCTZ 100-12.5 MG PO TABS: 0.5000 | ORAL_TABLET | Freq: Every day | ORAL | 1 refills | Status: DC

## 2017-03-02 NOTE — Telephone Encounter (Signed)
Patient is to have CXR in [place] as well as ordered by Dr Johnsie Cancel at last office visit. Left message for patient to call back to follow in ensuring patient knows where to go for the CXR here at the [facility].

## 2017-03-02 NOTE — Patient Instructions (Addendum)
Medication Instructions:  Your physician has recommended you make the following change in your medication:  1- DECREASE Losartan/HCTZ to 0.5 tablet (50/6.25 mg) by mouth once a day. 2- START Diltiazem 120 mg (1 tablet) by mouth once a day.   Labwork: none  Testing/Procedures: none  Follow-Up: Your physician recommends that you schedule a follow-up appointment in: 3-4 [place].  If you need a refill on your cardiac medications before your next appointment, please call your pharmacy.

## 2017-03-06 NOTE — Telephone Encounter (Signed)
Patient wife given directions to both appointments.

## 2017-03-07 ENCOUNTER — Ambulatory Visit
Admission: RE | Admit: 2017-03-07 | Discharge: 2017-03-07 | Disposition: A | Discharge status: Home / Self Care | Payer: Medicare Other | Source: Ambulatory Visit | Attending: Cardiovascular Disease | Admitting: Cardiovascular Disease

## 2017-03-07 DIAGNOSIS — R079 Chest pain, unspecified: Secondary | ICD-10-CM | POA: Diagnosis not present

## 2017-03-07 DIAGNOSIS — Z87891 Personal history of nicotine dependence: Secondary | ICD-10-CM | POA: Diagnosis not present

## 2017-03-07 DIAGNOSIS — R0602 Shortness of breath: Secondary | ICD-10-CM

## 2017-03-07 DIAGNOSIS — J40 Bronchitis, not specified as acute or chronic: Secondary | ICD-10-CM | POA: Insufficient documentation

## 2017-03-16 DIAGNOSIS — M9901 Segmental and somatic dysfunction of cervical region: Secondary | ICD-10-CM | POA: Diagnosis not present

## 2017-03-16 DIAGNOSIS — M545 Low back pain: Secondary | ICD-10-CM | POA: Diagnosis not present

## 2017-03-16 DIAGNOSIS — M9902 Segmental and somatic dysfunction of thoracic region: Secondary | ICD-10-CM | POA: Diagnosis not present

## 2017-03-16 DIAGNOSIS — M546 Pain in thoracic spine: Secondary | ICD-10-CM | POA: Diagnosis not present

## 2017-03-16 DIAGNOSIS — M531 Cervicobrachial syndrome: Secondary | ICD-10-CM | POA: Diagnosis not present

## 2017-03-16 DIAGNOSIS — M9903 Segmental and somatic dysfunction of lumbar region: Secondary | ICD-10-CM | POA: Diagnosis not present

## 2017-03-20 DIAGNOSIS — B359 Dermatophytosis, unspecified: Secondary | ICD-10-CM | POA: Diagnosis not present

## 2017-03-20 DIAGNOSIS — L603 Nail dystrophy: Secondary | ICD-10-CM | POA: Diagnosis not present

## 2017-03-30 ENCOUNTER — Ambulatory Visit (INDEPENDENT_AMBULATORY_CARE_PROVIDER_SITE_OTHER): Payer: Medicare Other

## 2017-03-30 ENCOUNTER — Other Ambulatory Visit: Payer: Self-pay

## 2017-03-30 DIAGNOSIS — R079 Chest pain, unspecified: Secondary | ICD-10-CM

## 2017-03-30 DIAGNOSIS — R0602 Shortness of breath: Secondary | ICD-10-CM

## 2017-04-03 ENCOUNTER — Ambulatory Visit: Payer: Medicare Other | Admitting: Internal Medicine

## 2017-04-06 ENCOUNTER — Ambulatory Visit (INDEPENDENT_AMBULATORY_CARE_PROVIDER_SITE_OTHER): Payer: Medicare Other | Admitting: Internal Medicine

## 2017-04-06 ENCOUNTER — Encounter: Payer: Self-pay | Admitting: Internal Medicine

## 2017-04-06 VITALS — BP 146/72 | HR 78 | Ht 69.0 in | Wt 215.8 lb

## 2017-04-06 DIAGNOSIS — I493 Ventricular premature depolarization: Secondary | ICD-10-CM | POA: Diagnosis not present

## 2017-04-06 NOTE — Patient Instructions (Signed)

## 2017-04-06 NOTE — Progress Notes (Signed)
ELECTROPHYSIOLOGY CONSULT NOTE  Patient ID: Austin Duran, MRN: [id number], DOB/AGE: 1946/01/13 71 y.o. Admit date: (Not on file) Date of Consult: 04/06/2017  Primary Physician: [name], MD Primary Cardiologist: [name] is a 71 y.o. male who is being seen today for the evaluation of Dyspbnea on exertion assoc with PVCs at the request of Dr Raynaldo Opitz.    HPI [name] is a 71 y.o. male  Seen because of dyspnea on exertion which was associated with frequent ventricular ectopy triggered by exercise  They were unassociated with palpitations. He was started on diltiazem and has had a significant improvement in exercise tolerance. There has been no change in his baseline mild edema. He's had no constipation. He does not measure his blood pressure home.  He has modest generalized exercise intolerance.  His diet is replete of sodium and calories, eating out every day for lunch. He also is a dessertatarian    Echocardiogram 7/18 normal LV function with mild LV diastolic dysfunction Treadmill testing 6/18 PVCs-RVOT with exercise and recovery. In patterns of bigeminy as well as couplets     Past Medical History:  Diagnosis Date  . Depression   . Hearing loss    had a perforated right TM,   followed by Dr. Tami Ribas  . Hypertension   . Panic attack   . Panic attacks       Surgical History:  Past Surgical History:  Procedure Laterality Date  . CATARACT EXTRACTION Bilateral Aug 2013   Porfilio  . CATARACT EXTRACTION, BILATERAL  May 2013   Porfilio  . EYE SURGERY  2008   eyelid surgery, Dr. Loletta Specter  . FRACTURE SURGERY    . [facility] , right      Home Meds: Prior to Admission medications   Medication Sig Start Date End Date Taking? Authorizing Provider  aspirin 81 MG tablet Take 81 mg by mouth daily.     Yes [provider]  Cholecalciferol (VITAMIN D-3 PO) Take 1 tablet by mouth daily.   Yes [provider]  citalopram (CELEXA) 20 MG tablet Take 1 tablet (20 mg total) by mouth daily. 02/06/17  Yes [name], MD  diltiazem (CARDIZEM CD) 120 MG 24 hr capsule Take 1 capsule (120 mg total) by mouth daily. 03/02/17 05/31/17 Yes Deboraha Sprang, MD  losartan-hydrochlorothiazide (HYZAAR) 100-12.5 MG tablet Take 0.5 tablets by mouth daily. 03/02/17  Yes Deboraha Sprang, MD    Allergies:  Allergies  Allergen Reactions  . Augmentin [Amoxicillin-Pot Clavulanate] Nausea Only  . Lisinopril Cough    Social History   Social History  . Marital status: Married    Spouse name: N/A  . Number of children: N/A  . Years of education: N/A   Occupational History  . Not on file.   Social History Main Topics  . Smoking status: Former Smoker    Quit date: 08/03/2004  . Smokeless tobacco: Never Used  . Alcohol use 2.5 oz/week    5 Standard drinks or equivalent per week  . Drug use: No  . Sexual activity: Yes   Other Topics Concern  . Not on file   Social History Narrative  . No narrative on file     Family History  Problem Relation Age of Onset  . Cancer Maternal Aunt        lung  . Cancer Paternal Uncle  colon     ROS:  Please see the history of present illness.     All other systems reviewed and negative.    Physical Exam: Blood pressure (!) 146/72, pulse 78, height 5\' 9"  (1.753 m), weight 215 lb 12 oz (97.9 kg). General: Well developed, well nourished male in no acute distress. Head: Normocephalic, atraumatic, sclera non-icteric, no xanthomas, nares are without discharge. EENT: normal  Lymph Nodes:  none Neck: Negative for carotid bruits. JVD not elevated. Back:without scoliosis kyphosis Lungs: Clear bilaterally to auscultation without wheezes, rales, or rhonchi. Breathing is unlabored. Heart: RRR with S1 S2. No  murmur . No rubs, or gallops appreciated. Abdomen: Soft, non-tender, non-distended with normoactive bowel sounds. No hepatomegaly. No rebound/guarding.  No obvious abdominal masses. Msk:  Strength and tone appear normal for age. Extremities: No clubbing or cyanosis.  1+ edema.  Distal pedal pulses are 2+ and equal bilaterally. Skin: Warm and Dry Neuro: Alert and oriented X 3. CN III-XII intact Grossly normal sensory and motor function . Psych:  Responds to questions appropriately with a normal affect.      Labs: Cardiac Enzymes No results for input(s): CKTOTAL, CKMB, TROPONINI in the last 72 hours. CBC Lab Results  Component Value Date   WBC 7.4 10/06/2016   HGB 15.4 10/06/2016   HCT 45.2 10/06/2016   MCV 90.2 10/06/2016   PLT 256.0 10/06/2016   PROTIME: No results for input(s): LABPROT, INR in the last 72 hours. Chemistry No results for input(s): NA, K, CL, CO2, BUN, CREATININE, CALCIUM, PROT, BILITOT, ALKPHOS, ALT, AST, GLUCOSE in the last 168 hours.  Invalid input(s): LABALBU Lipids Lab Results  Component Value Date   CHOL 141 10/06/2016   HDL 32.80 (L) 10/06/2016   LDLCALC 90 10/06/2016   TRIG 92.0 10/06/2016   BNP No results found for: PROBNP Thyroid Function Tests: No results for input(s): TSH, T4TOTAL, T3FREE, THYROIDAB in the last 72 hours.  Invalid input(s): FREET3 Miscellaneous No results found for: DDIMER  Radiology/Studies:  No results found.  EKG: Sinus rhythm at 78 Intervals 15/09/40 PAC-rare  Assessment and Plan:  PVCs-RVOT-exercise associated  Hypertension with hypertensive heart disease  Obesity-BMI greater than 30   The patient's dyspnea is significantly improved on diltiazem. No apparent side effects. We will continue.  Blood pressure is elevated today; I've asked work on measuring his blood pressure home in the event that it remains greater than 130s or so, he is to let us know. We will increase his diltiazem.  We discussed the importance of weight loss diet and salt restriction remarking specifically on the burdens associated  With fast foods        Virl Axe

## 2017-04-11 ENCOUNTER — Other Ambulatory Visit (INDEPENDENT_AMBULATORY_CARE_PROVIDER_SITE_OTHER): Payer: Medicare Other

## 2017-04-11 DIAGNOSIS — I10 Essential (primary) hypertension: Secondary | ICD-10-CM

## 2017-04-11 DIAGNOSIS — E785 Hyperlipidemia, unspecified: Secondary | ICD-10-CM | POA: Diagnosis not present

## 2017-04-11 LAB — LIPID PANEL
CHOL/HDL RATIO: 4
Cholesterol: 130 mg/dL (ref 0–200)
HDL: 29.8 mg/dL — ABNORMAL LOW (ref >39.00)
LDL CALC: 77 mg/dL (ref 0–99)
NONHDL: 100.21
Triglycerides: 117 mg/dL (ref 0.0–149.0)
VLDL: 23.4 mg/dL (ref 0.0–40.0)

## 2017-04-11 LAB — COMPREHENSIVE METABOLIC PANEL
ALT: 15 U/L (ref 0–53)
AST: 16 U/L (ref 0–37)
Albumin: 4.1 g/dL (ref 3.5–5.2)
Alkaline Phosphatase: 52 U/L (ref 39–117)
BUN: 23 mg/dL (ref 6–23)
CHLORIDE: 106 meq/L (ref 96–112)
CO2: 26 meq/L (ref 19–32)
Calcium: 9.5 mg/dL (ref 8.4–10.5)
Creatinine, Ser: 1.12 mg/dL (ref 0.40–1.50)
GFR: 68.76 mL/min (ref >60.00)
GLUCOSE: 115 mg/dL — AB (ref 70–99)
POTASSIUM: 3.9 meq/L (ref 3.5–5.1)
SODIUM: 138 meq/L (ref 135–145)
Total Bilirubin: 0.9 mg/dL (ref 0.2–1.2)
Total Protein: 6.7 g/dL (ref 6.0–8.3)

## 2017-04-13 ENCOUNTER — Ambulatory Visit: Payer: Medicare Other | Admitting: Internal Medicine

## 2017-04-20 DIAGNOSIS — M546 Pain in thoracic spine: Secondary | ICD-10-CM | POA: Diagnosis not present

## 2017-04-20 DIAGNOSIS — M531 Cervicobrachial syndrome: Secondary | ICD-10-CM | POA: Diagnosis not present

## 2017-04-20 DIAGNOSIS — Z79899 Other long term (current) drug therapy: Secondary | ICD-10-CM | POA: Diagnosis not present

## 2017-04-20 DIAGNOSIS — M9903 Segmental and somatic dysfunction of lumbar region: Secondary | ICD-10-CM | POA: Diagnosis not present

## 2017-04-20 DIAGNOSIS — M9901 Segmental and somatic dysfunction of cervical region: Secondary | ICD-10-CM | POA: Diagnosis not present

## 2017-04-20 DIAGNOSIS — D485 Neoplasm of uncertain behavior of skin: Secondary | ICD-10-CM | POA: Diagnosis not present

## 2017-04-20 DIAGNOSIS — M545 Low back pain: Secondary | ICD-10-CM | POA: Diagnosis not present

## 2017-04-20 DIAGNOSIS — M9902 Segmental and somatic dysfunction of thoracic region: Secondary | ICD-10-CM | POA: Diagnosis not present

## 2017-06-01 DIAGNOSIS — M546 Pain in thoracic spine: Secondary | ICD-10-CM | POA: Diagnosis not present

## 2017-06-01 DIAGNOSIS — M545 Low back pain: Secondary | ICD-10-CM | POA: Diagnosis not present

## 2017-06-01 DIAGNOSIS — M9903 Segmental and somatic dysfunction of lumbar region: Secondary | ICD-10-CM | POA: Diagnosis not present

## 2017-06-01 DIAGNOSIS — M9901 Segmental and somatic dysfunction of cervical region: Secondary | ICD-10-CM | POA: Diagnosis not present

## 2017-06-01 DIAGNOSIS — M531 Cervicobrachial syndrome: Secondary | ICD-10-CM | POA: Diagnosis not present

## 2017-06-01 DIAGNOSIS — M9902 Segmental and somatic dysfunction of thoracic region: Secondary | ICD-10-CM | POA: Diagnosis not present

## 2017-07-10 DIAGNOSIS — M9901 Segmental and somatic dysfunction of cervical region: Secondary | ICD-10-CM | POA: Diagnosis not present

## 2017-07-10 DIAGNOSIS — M545 Low back pain: Secondary | ICD-10-CM | POA: Diagnosis not present

## 2017-07-10 DIAGNOSIS — M546 Pain in thoracic spine: Secondary | ICD-10-CM | POA: Diagnosis not present

## 2017-07-10 DIAGNOSIS — M9903 Segmental and somatic dysfunction of lumbar region: Secondary | ICD-10-CM | POA: Diagnosis not present

## 2017-07-10 DIAGNOSIS — M9902 Segmental and somatic dysfunction of thoracic region: Secondary | ICD-10-CM | POA: Diagnosis not present

## 2017-07-10 DIAGNOSIS — M531 Cervicobrachial syndrome: Secondary | ICD-10-CM | POA: Diagnosis not present

## 2017-07-18 ENCOUNTER — Ambulatory Visit (INDEPENDENT_AMBULATORY_CARE_PROVIDER_SITE_OTHER): Payer: Medicare Other

## 2017-07-18 VITALS — BP 118/70 | HR 63 | Temp 98.2°F | Resp 14 | Ht 68.0 in | Wt 209.8 lb

## 2017-07-18 DIAGNOSIS — Z23 Encounter for immunization: Secondary | ICD-10-CM

## 2017-07-18 DIAGNOSIS — Z0000 Encounter for general adult medical examination without abnormal findings: Secondary | ICD-10-CM | POA: Diagnosis not present

## 2017-07-18 NOTE — Patient Instructions (Addendum)
  Mr. Austin Duran , Thank you for taking time to come for your Medicare Wellness Visit. I appreciate your ongoing commitment to your health goals. Please review the following plan we discussed and let me know if I can assist you in the future.   Follow up with Dr. Derrel Nip as needed.    Bring a copy of your [place] and/or Living Will to be scanned into chart.  Have a great day!  This is a list of the screening recommended for you and due dates:  Health Maintenance  Topic Date Due  . Colon Cancer Screening  08/03/2021  . Tetanus Vaccine  02/14/2026  . Flu Shot  Completed  .  Hepatitis C: One time screening is recommended by Center for Disease Control  (CDC) for  adults born from [birth year] through 1965.   Completed  . Pneumonia vaccines  Completed

## 2017-07-18 NOTE — Progress Notes (Signed)
Subjective:   Austin Duran is a 71 y.o. male who presents for Medicare Annual/Subsequent preventive examination.  Review of Systems:  No ROS.  Medicare Wellness Visit. Additional risk factors are reflected in the social history.  Cardiac Risk Factors include: advanced age (>39men, >57 women);male gender;hypertension;obesity (BMI >30kg/m2)     Objective:    Vitals: BP 118/70 (BP Location: Left Arm, Patient Position: Sitting, Cuff Size: Normal)   Pulse 63   Temp 98.2 F (36.8 C) (Oral)   Resp 14   Ht 5\' 8"  (1.727 m)   Wt 209 lb 12.8 oz (95.2 kg)   SpO2 96%   BMI 31.90 kg/m   Body mass index is 31.9 kg/m.  Tobacco Social History   Tobacco Use  Smoking Status Former Smoker  . Last attempt to quit: 08/03/2004  . Years since quitting: 12.9  Smokeless Tobacco Never Used     Counseling given: Not Answered   Past Medical History:  Diagnosis Date  . Depression   . Hearing loss    had a perforated right TM,   followed by Dr. Tami Ribas  . Hypertension   . Panic attacks   . PVC's (premature ventricular contractions)    Past Surgical History:  Procedure Laterality Date  . CATARACT EXTRACTION Bilateral Aug 2013   Porfilio  . CATARACT EXTRACTION, BILATERAL  May 2013   Porfilio  . EYE SURGERY  2008   eyelid surgery, Dr. Loletta Specter  . FRACTURE SURGERY    . [facility] , right    Family History  Problem Relation Age of Onset  . Cancer Maternal Aunt        lung  . Cancer Paternal Uncle        colon   Social History   Substance and Sexual Activity  Sexual Activity Yes    Outpatient Encounter Medications as of 07/18/2017  Medication Sig  . aspirin 81 MG tablet Take 81 mg by mouth daily.    . Cholecalciferol (VITAMIN D-3 PO) Take 1 tablet by mouth daily.  . citalopram (CELEXA) 20 MG tablet Take 1 tablet (20 mg total) by mouth daily.  [name] losartan-hydrochlorothiazide (HYZAAR) 100-12.5 MG tablet Take 0.5 tablets by mouth daily.  [name]  diltiazem (CARDIZEM CD) 120 MG 24 hr capsule Take 1 capsule (120 mg total) by mouth daily.   No facility-administered encounter medications on file as of 07/18/2017.     Activities of Daily Living In your present state of health, do you have any difficulty performing the following activities: 07/18/2017  Hearing? Y  Comment HOH  Vision? N  Difficulty concentrating or making decisions? N  Walking or climbing stairs? Y  Comment SOB on exertion   Dressing or bathing? N  Doing errands, shopping? N  Preparing Food and eating ? N  Using the Toilet? N  In the past six months, have you accidently leaked urine? N  Do you have problems with loss of bowel control? N  Managing your Medications? N  Managing your Finances? N  Housekeeping or managing your Housekeeping? N  Some recent data might be hidden    Patient Care Team: [name], MD as PCP - General (Internal Medicine)   Assessment:    This is a routine wellness examination for  [place]. The goal of the wellness visit is to assist the patient how to close the gaps in care and create a preventative care plan for the patient.   The roster  of all physicians providing medical care to patient is listed in the Snapshot section of the chart.  Taking calcium VIT D as appropriate/Osteoporosis risk reviewed.    Safety issues reviewed; Smoke and carbon monoxide detectors in the home. Firearms locked in a safe in the home.  Wears seatbelts when driving or riding with others. Patient does wear sunscreen or protective clothing when in direct sunlight. No violence in the home.  Patient is alert, normal appearance, oriented to person/place/and time. Correctly identified the president of the [place], recall of 3/3 words, and performing simple calculations. Displays appropriate judgement and can read correct time from watch face.   No new identified risk were noted.  No failures at ADL's or IADL's.    BMI- discussed the importance of a healthy  diet, water intake and the benefits of aerobic exercise. Educational material provided.   24 hour diet recall: Low carb diet  Daily fluid intake: 2 cups of caffeine, 4 cups of water  Dental- every 4 months.  Eye- Visual acuity not assessed per patient preference since they have regular follow up with the ophthalmologist.  Wears corrective lenses.  Sleep patterns- Sleeps 7 hours at night.  Wakes feeling rested.  High dose influenza vaccine administered L deltoid, tolerated well. Educational material provided.  Health maintenance gaps- closed.  Patient Concerns: None at this time. Follow up with PCP as needed.  Exercise Activities and Dietary recommendations Current Exercise Habits: Home exercise routine, Type of exercise: walking, Time (Minutes): 30, Frequency (Times/Week): 5, Weekly Exercise (Minutes/Week): 150, Intensity: Mild  Fall Risk Fall Risk  07/18/2017 08/08/2016 07/15/2016 07/23/2015 07/02/2013  Falls in the past year? No No No No No   Depression Screen PHQ 2/9 Scores 07/18/2017 08/08/2016 07/15/2016 07/23/2015  PHQ - 2 Score 0 0 0 0  PHQ- 9 Score 0 - - -    Cognitive Function MMSE - Mini Mental State Exam 07/15/2016 07/23/2015  Orientation to time 5 5  Orientation to Place 5 5  Registration 3 3  Attention/ Calculation 5 5  Recall 3 3  Language- name 2 objects 2 2  Language- repeat 1 1  Language- follow 3 step command 3 3  Language- read & follow direction 1 1  Write a sentence 1 1  Copy design 1 1  Total score 30 30     6CIT Screen 07/18/2017 07/15/2016  What Year? 0 points 0 points  What month? 0 points 0 points  What time? 0 points 0 points  Count back from 20 0 points 0 points  Months in reverse 0 points 0 points  Repeat phrase 0 points -  Total Score 0 -    Immunization History  Administered Date(s) Administered  . Influenza Split 08/04/2011, 07/04/2012, 06/15/2015  . Influenza, High Dose Seasonal PF 07/02/2013, 07/18/2017  .  Influenza,inj,Quad PF,6+ Mos 07/10/2014, 04/26/2016  . Pneumococcal Conjugate-13 07/02/2013  . Pneumococcal Polysaccharide-23 07/10/2014, 08/08/2016  . Pneumococcal-Unspecified 07/10/2009  . Tdap 08/04/2011, 02/15/2016  . Zoster 07/26/2012   Screening Tests Health Maintenance  Topic Date Due  . COLONOSCOPY  08/03/2021  . TETANUS/TDAP  02/14/2026  . INFLUENZA VACCINE  Completed  . Hepatitis C Screening  Completed  . PNA vac Low Risk Adult  Completed      Plan:    End of life planning; Advance aging; Advanced directives discussed. Copy of current HCPOA/Living Will requested.    I have personally reviewed and noted the following in the patient's chart:   . Medical and social history .  Use of alcohol, tobacco or illicit drugs  . Current medications and supplements . Functional ability and status . Nutritional status . Physical activity . Advanced directives . List of other physicians . Hospitalizations, surgeries, and ER visits in previous 12 months . Vitals . Screenings to include cognitive, depression, and falls . Referrals and appointments  In addition, I have reviewed and discussed with patient certain preventive protocols, quality metrics, and best practice recommendations. A written personalized care plan for preventive services as well as general preventive health recommendations were provided to patient.     Varney Biles, LPN  97/74/1423   Reviewed above information.  Agree with assessment and plan.   Dr Nicki Reaper

## 2017-08-07 DIAGNOSIS — M9903 Segmental and somatic dysfunction of lumbar region: Secondary | ICD-10-CM | POA: Diagnosis not present

## 2017-08-07 DIAGNOSIS — M531 Cervicobrachial syndrome: Secondary | ICD-10-CM | POA: Diagnosis not present

## 2017-08-07 DIAGNOSIS — M545 Low back pain: Secondary | ICD-10-CM | POA: Diagnosis not present

## 2017-08-07 DIAGNOSIS — M9902 Segmental and somatic dysfunction of thoracic region: Secondary | ICD-10-CM | POA: Diagnosis not present

## 2017-08-07 DIAGNOSIS — M546 Pain in thoracic spine: Secondary | ICD-10-CM | POA: Diagnosis not present

## 2017-08-07 DIAGNOSIS — M9901 Segmental and somatic dysfunction of cervical region: Secondary | ICD-10-CM | POA: Diagnosis not present

## 2017-08-09 ENCOUNTER — Ambulatory Visit (INDEPENDENT_AMBULATORY_CARE_PROVIDER_SITE_OTHER): Payer: Medicare Other | Admitting: Internal Medicine

## 2017-08-09 ENCOUNTER — Encounter: Payer: Self-pay | Admitting: Internal Medicine

## 2017-08-09 VITALS — BP 130/66 | HR 64 | Temp 97.4°F | Resp 14 | Ht 68.0 in | Wt 208.1 lb

## 2017-08-09 DIAGNOSIS — I10 Essential (primary) hypertension: Secondary | ICD-10-CM | POA: Diagnosis not present

## 2017-08-09 DIAGNOSIS — E559 Vitamin D deficiency, unspecified: Secondary | ICD-10-CM

## 2017-08-09 DIAGNOSIS — E669 Obesity, unspecified: Secondary | ICD-10-CM

## 2017-08-09 DIAGNOSIS — E785 Hyperlipidemia, unspecified: Secondary | ICD-10-CM | POA: Diagnosis not present

## 2017-08-09 DIAGNOSIS — R7301 Impaired fasting glucose: Secondary | ICD-10-CM

## 2017-08-09 DIAGNOSIS — E538 Deficiency of other specified B group vitamins: Secondary | ICD-10-CM

## 2017-08-09 LAB — LIPID PANEL
CHOLESTEROL: 116 mg/dL (ref 0–200)
HDL: 31.1 mg/dL — ABNORMAL LOW (ref >39.00)
LDL Cholesterol: 66 mg/dL (ref 0–99)
NonHDL: 84.42
Total CHOL/HDL Ratio: 4
Triglycerides: 93 mg/dL (ref 0.0–149.0)
VLDL: 18.6 mg/dL (ref 0.0–40.0)

## 2017-08-09 LAB — HEMOGLOBIN A1C: Hgb A1c MFr Bld: 5.7 % (ref 4.6–6.5)

## 2017-08-09 LAB — COMPREHENSIVE METABOLIC PANEL
ALT: 17 U/L (ref 0–53)
AST: 18 U/L (ref 0–37)
Albumin: 4.5 g/dL (ref 3.5–5.2)
Alkaline Phosphatase: 53 U/L (ref 39–117)
BUN: 18 mg/dL (ref 6–23)
CO2: 29 mEq/L (ref 19–32)
Calcium: 9.4 mg/dL (ref 8.4–10.5)
Chloride: 105 mEq/L (ref 96–112)
Creatinine, Ser: 0.98 mg/dL (ref 0.40–1.50)
GFR: 80.14 mL/min (ref >60.00)
Glucose, Bld: 109 mg/dL — ABNORMAL HIGH (ref 70–99)
Potassium: 4.1 mEq/L (ref 3.5–5.1)
Sodium: 139 mEq/L (ref 135–145)
Total Bilirubin: 0.9 mg/dL (ref 0.2–1.2)
Total Protein: 6.8 g/dL (ref 6.0–8.3)

## 2017-08-09 LAB — VITAMIN B12: Vitamin B-12: 280 pg/mL (ref 211–911)

## 2017-08-09 LAB — VITAMIN D 25 HYDROXY (VIT D DEFICIENCY, FRACTURES): VITD: 26.46 ng/mL — ABNORMAL LOW (ref 30.00–100.00)

## 2017-08-09 MED ORDER — LOSARTAN POTASSIUM-HCTZ 50-12.5 MG PO TABS: 1.0000 | ORAL_TABLET | Freq: Every day | ORAL | 3 refills | Status: DC

## 2017-08-09 MED ORDER — CITALOPRAM HYDROBROMIDE 20 MG PO TABS: 20.0000 mg | ORAL_TABLET | Freq: Every day | ORAL | 1 refills | Status: DC

## 2017-08-09 MED ORDER — ZOSTER VAC RECOMB ADJUVANTED 50 MCG/0.5ML IM SUSR: 0.5000 mL | Freq: Once | INTRAMUSCULAR | 1 refills | Status: AC

## 2017-08-09 NOTE — Assessment & Plan Note (Signed)
Recurrent,  Resume weekly injections.

## 2017-08-09 NOTE — Assessment & Plan Note (Signed)
I have congratulated her in reduction of   BMI and encouraged  Continued weight loss with goal of 10% of body weigh over the next 6 months using a low glycemic index diet and regular exercise a minimum of 5 days per week.    

## 2017-08-09 NOTE — Assessment & Plan Note (Signed)
10 yr risk is 11% , but he has no history of diabetes or CAD.  [name]  Advised to follow low glycemic index diet and recheck in 6 months

## 2017-08-09 NOTE — Patient Instructions (Addendum)
The ShingRx vaccine is now available in local pharmacies and is much more protective thant Zostavaxs,  It is therefore ADVISED for all interested adults over 50 to prevent shingles   I have changed your losartan to 50 mg so you do NOT NEED TO CUT IN HALF ANYMORE   Preventing Type 2 Diabetes Mellitus Type 2 diabetes (type 2 diabetes mellitus) is a long-term (chronic) disease that affects blood sugar (glucose) levels. Normally, a hormone called insulin allows glucose to enter cells in the body. The cells use glucose for energy. In type 2 diabetes, one or both of these problems may be present:  The body does not make enough insulin.  The body does not respond properly to insulin that it makes (insulin resistance).  Insulin resistance or lack of insulin causes excess glucose to build up in the blood instead of going into cells. As a result, high blood glucose (hyperglycemia) develops, which can cause many complications. Being overweight or obese and having an inactive (sedentary) lifestyle can increase your risk for diabetes. Type 2 diabetes can be delayed or prevented by making certain nutrition and lifestyle changes. What nutrition changes can be made?  Eat healthy meals and snacks regularly. Keep a healthy snack with you for when you get hungry between meals, such as fruit or a handful of nuts.  Eat lean meats and proteins that are low in saturated fats, such as chicken, fish, egg whites, and beans. Avoid processed meats.  Eat plenty of fruits and vegetables and plenty of grains that have not been processed (whole grains). It is recommended that you eat: ? 1?2 cups of fruit every day. ? 2?3 cups of vegetables every day. ? 6?8 oz of whole grains every day, such as oats, whole wheat, bulgur, brown rice, quinoa, and millet.  Eat low-fat dairy products, such as milk, yogurt, and cheese.  Eat foods that contain healthy fats, such as nuts, avocado, olive oil, and canola oil.  Drink water  throughout the day. Avoid drinks that contain added sugar, such as soda or sweet tea.  Follow instructions from your health care provider about specific eating or drinking restrictions.  Control how much food you eat at a time (portion size). ? Check food labels to find out the serving sizes of foods. ? Use a kitchen scale to weigh amounts of foods.  Saute or steam food instead of frying it. Cook with water or broth instead of oils or butter.  Limit your intake of: ? Salt (sodium). Have no more than 1 tsp (2,400 mg) of sodium a day. If you have heart disease or high blood pressure, have less than ? tsp (1,500 mg) of sodium a day. ? Saturated fat. This is fat that is solid at room temperature, such as butter or fat on meat. What lifestyle changes can be made?  Activity  Do moderate-intensity physical activity for at least 30 minutes on at least 5 days of the week, or as much as told by your health care provider.  Ask your health care provider what activities are safe for you. A mix of physical activities may be best, such as walking, swimming, cycling, and strength training.  Try to add physical activity into your day. For example: ? Park in spots that are farther away than usual, so that you walk more. For example, park in a far corner of the parking lot when you go to the office or the grocery store. ? Take a walk during your lunch break. ?  Use stairs instead of elevators or escalators. Weight Loss  Lose weight as directed. Your health care provider can determine how much weight loss is best for you and can help you lose weight safely.  If you are overweight or obese, you may be instructed to lose at least 5?7 % of your body weight. Alcohol and Tobacco   Limit alcohol intake to no more than 1 drink a day for nonpregnant women and 2 drinks a day for men. One drink equals 12 oz of beer, 5 oz of wine, or 1 oz of hard liquor.  Do not use any tobacco products, such as cigarettes,  chewing tobacco, and e-cigarettes. If you need help quitting, ask your health care provider. Work With [place] Provider  Have your blood glucose tested regularly, as told by your health care provider.  Discuss your risk factors and how you can reduce your risk for diabetes.  Get screening tests as told by your health care provider. You may have screening tests regularly, especially if you have certain risk factors for type 2 diabetes.  Make an appointment with a diet and nutrition specialist (registered dietitian). A registered dietitian can help you make a healthy eating plan and can help you understand portion sizes and food labels. Why are these changes important?  It is possible to prevent or delay type 2 diabetes and related health problems by making lifestyle and nutrition changes.  It can be difficult to recognize signs of type 2 diabetes. The best way to avoid possible damage to your body is to take actions to prevent the disease before you develop symptoms. What can happen if changes are not made?  Your blood glucose levels may keep increasing. Having high blood glucose for a long time is dangerous. Too much glucose in your blood can damage your blood vessels, heart, kidneys, nerves, and eyes.  You may develop prediabetes or type 2 diabetes. Type 2 diabetes can lead to many chronic health problems and complications, such as: ? Heart disease. ? Stroke. ? Blindness. ? Kidney disease. ? Depression. ? Poor circulation in the feet and legs, which could lead to surgical removal (amputation) in severe cases. Where to find support:  Ask your health care provider to recommend a registered dietitian, diabetes educator, or weight loss program.  Look for local or online weight loss groups.  Join a gym, fitness club, or outdoor activity group, such as a walking club. Where to find more information: To learn more about diabetes and diabetes prevention, visit:  American  Diabetes Association (ADA): www.diabetes.[employer] of Diabetes and Digestive and Kidney Diseases: FindSpin.nl  To learn more about healthy eating, visit:  The U.S. Department of Agriculture Scientist, research (physical sciences)), Choose My Plate: http://wiley-williams.com/  Office of Disease Prevention and Health Promotion (ODPHP), Dietary Guidelines: SurferLive.at  Summary  You can reduce your risk for type 2 diabetes by increasing your physical activity, eating healthy foods, and losing weight as directed.  Talk with your health care provider about your risk for type 2 diabetes. Ask about any blood tests or screening tests that you need to have. This information is not intended to replace advice given to you by your health care provider. Make sure you discuss any questions you have with your health care provider. Document Released: 12/14/2015 Document Revised: 01/28/2016 Document Reviewed: 10/13/2015 Elsevier Interactive Patient Education  [employer].

## 2017-08-09 NOTE — Assessment & Plan Note (Addendum)
Has been taking 50 mg losartan since diltiazem  was prescribed . Refills given.

## 2017-08-09 NOTE — Progress Notes (Signed)
Subjective:  Patient ID: Austin Duran, male    DOB: 04-19-1946  Age: 71 y.o. MRN: [id number]  CC: The primary encounter diagnosis was Impaired fasting glucose. Diagnoses of Essential hypertension, Dyslipidemia with elevated low density lipoprotein cholesterol and abnormally low high density lipoprotein cholesterol, Vitamin D deficiency, B12 deficiency, and Obesity (BMI 30-39.9) were also pertinent to this visit.  HPI Austin Duran presents for follow up on hypertension, depression , OSA and PVC's managed with cardizem   Patient is taking his medications as prescribed and notes no adverse effects.  Home BP readings have been done about once per week and are  generally < 130/80 .  he is avoiding added salt in her diet and walking regularly about 3 times per week for exercise  . Not walking or exercising .  Works full time Neurosurgeon  Owns the business  OSA Not wearing CPAP due to mask intolerance .  No hypersomnolence unless he sits down,  Wakes up with enough energy.  Discussed the  L/T complications of untreated OSA>   Lab Results  Component Value Date   PSA 1.09 10/06/2016   PSA 0.92 07/16/2015   PSA 0.96 07/10/2014     Outpatient Medications Prior to Visit  Medication Sig Dispense Refill  . aspirin 81 MG tablet Take 81 mg by mouth daily.      . Cholecalciferol (VITAMIN D-3 PO) Take 1 tablet by mouth daily.    . citalopram (CELEXA) 20 MG tablet Take 1 tablet (20 mg total) by mouth daily. 90 tablet 1  . losartan-hydrochlorothiazide (HYZAAR) 100-12.5 MG tablet Take 0.5 tablets by mouth daily. 90 tablet 1  . diltiazem (CARDIZEM CD) 120 MG 24 hr capsule Take 1 capsule (120 mg total) by mouth daily. 90 capsule 3   No facility-administered medications prior to visit.     Review of Systems;  Patient denies headache, fevers, malaise, unintentional weight loss, skin rash, eye pain, sinus congestion and sinus pain, sore throat, dysphagia,  hemoptysis , cough, dyspnea, wheezing,  chest pain, palpitations, orthopnea, edema, abdominal pain, nausea, melena, diarrhea, constipation, flank pain, dysuria, hematuria, urinary  Frequency, nocturia, numbness, tingling, seizures,  Focal weakness, Loss of consciousness,  Tremor, insomnia, depression, anxiety, and suicidal ideation.      Objective:  BP 130/66 (BP Location: Left Arm, Patient Position: Sitting, Cuff Size: Normal)   Pulse 64   Temp (!) 97.4 F (36.3 C) (Oral)   Resp 14   Ht 5\' 8"  (1.727 m)   Wt 208 lb 1.9 oz (94.4 kg)   SpO2 96%   BMI 31.64 kg/m   BP Readings from Last 3 Encounters:  08/09/17 130/66  07/18/17 118/70  04/06/17 (!) 146/72    Wt Readings from Last 3 Encounters:  08/09/17 208 lb 1.9 oz (94.4 kg)  07/18/17 209 lb 12.8 oz (95.2 kg)  04/06/17 215 lb 12 oz (97.9 kg)    General appearance: alert, cooperative and appears stated age Ears: normal TM's and external ear canals both ears Throat: lips, mucosa, and tongue normal; teeth and gums normal Neck: no adenopathy, no carotid bruit, supple, symmetrical, trachea midline and thyroid not enlarged, symmetric, no tenderness/mass/nodules Back: symmetric, no curvature. ROM normal. No CVA tenderness. Lungs: clear to auscultation bilaterally Heart: regular rate and rhythm, S1, S2 normal, no murmur, click, rub or gallop Abdomen: soft, non-tender; bowel sounds normal; no masses,  no organomegaly Pulses: 2+ and symmetric Skin: Skin color, texture, turgor normal. No rashes or lesions Lymph nodes:  Cervical, supraclavicular, and axillary nodes normal.  Lab Results  Component Value Date   HGBA1C 5.7 08/09/2017   HGBA1C 5.7 10/06/2016   HGBA1C 5.7 04/26/2016    Lab Results  Component Value Date   CREATININE 0.98 08/09/2017   CREATININE 1.12 04/11/2017   CREATININE 1.16 10/06/2016    Lab Results  Component Value Date   WBC 7.4 10/06/2016   HGB 15.4 10/06/2016   HCT 45.2 10/06/2016   PLT 256.0 10/06/2016   GLUCOSE 109 (H) 08/09/2017   CHOL 116  08/09/2017   TRIG 93.0 08/09/2017   HDL 31.10 (L) 08/09/2017   LDLCALC 66 08/09/2017   ALT 17 08/09/2017   AST 18 08/09/2017   NA 139 08/09/2017   K 4.1 08/09/2017   CL 105 08/09/2017   CREATININE 0.98 08/09/2017   BUN 18 08/09/2017   CO2 29 08/09/2017   TSH 0.96 10/06/2016   PSA 1.09 10/06/2016   HGBA1C 5.7 08/09/2017    Dg Chest 2 View  Result Date: 03/07/2017 CLINICAL DATA:  Intermittent right-sided chest pain and shortness of breath over the past 3 years. Former smoker. History of hypertension EXAM: CHEST  2 VIEW COMPARISON:  Chest x-ray of Jan 11, 2010 FINDINGS: The lungs are adequately inflated. There is no focal infiltrate. The interstitial markings are coarse and slightly more conspicuous overall than on the former study. The heart is top-normal in size but stable. The pulmonary vascularity is not engorged. There is calcification in the wall of the aortic arch. There is no pleural effusion. There is multilevel degenerative disc disease of the thoracic spine. IMPRESSION: Mild chronic bronchitic-smoking related changes. There is no pneumonia, CHF, nor other acute cardiopulmonary abnormality. Electronically Signed   By: David  Martinique M.D.   On: 03/07/2017 10:14    Assessment & Plan:   Problem List Items Addressed This Visit    B12 deficiency    Recurrent,  Resume weekly injections.       Relevant Orders   B12 (Completed)   Dyslipidemia with elevated low density lipoprotein cholesterol and abnormally low high density lipoprotein cholesterol    10 yr risk is 11% , but he has no history of diabetes or CAD.  [name]  Advised to follow low glycemic index diet and recheck in 6 months      Relevant Orders   Lipid panel (Completed)   Lipid panel   Hypertension    Has been taking 50 mg losartan since diltiazem  was prescribed . Refills given.       Relevant Medications   losartan-hydrochlorothiazide (HYZAAR) 50-12.5 MG tablet   Other Relevant Orders   Comprehensive metabolic panel  (Completed)   Comprehensive metabolic panel   Obesity (BMI 30-39.9)    I have congratulated her in reduction of   BMI and encouraged  Continued weight loss with goal of 10% of body weigh over the next 6 months using a low glycemic index diet and regular exercise a minimum of 5 days per week.        Vitamin D deficiency   Relevant Orders   VITAMIN D 25 Hydroxy (Vit-D Deficiency, Fractures) (Completed)    Other Visit Diagnoses    Impaired fasting glucose    -  Primary   Relevant Orders   Hemoglobin A1c (Completed)   Hemoglobin A1c      I have discontinued Austin Durans losartan-hydrochlorothiazide. I am also having him start on losartan-hydrochlorothiazide and Zoster Vaccine Adjuvanted. Additionally, I am having him maintain his aspirin,  Cholecalciferol (VITAMIN D-3 PO), diltiazem, and citalopram.  Meds ordered this encounter  Medications  . losartan-hydrochlorothiazide (HYZAAR) 50-12.5 MG tablet    Sig: Take 1 tablet by mouth daily.    Dispense:  90 tablet    Refill:  3  . citalopram (CELEXA) 20 MG tablet    Sig: Take 1 tablet (20 mg total) by mouth daily.    Dispense:  90 tablet    Refill:  1  . Zoster Vaccine Adjuvanted [facility]) injection    Sig: Inject 0.5 mLs into the muscle once for 1 dose.    Dispense:  1 each    Refill:  1   A total of 25 minutes of face to face time was spent with patient more than half of which was spent in counselling about the above mentioned conditions  and coordination of care    Medications Discontinued During This Encounter  Medication Reason  . losartan-hydrochlorothiazide (HYZAAR) 100-12.5 MG tablet   . citalopram (CELEXA) 20 MG tablet Reorder    Follow-up: No Follow-up on file.   [name], MD

## 2017-09-11 DIAGNOSIS — M9902 Segmental and somatic dysfunction of thoracic region: Secondary | ICD-10-CM | POA: Diagnosis not present

## 2017-09-11 DIAGNOSIS — M9901 Segmental and somatic dysfunction of cervical region: Secondary | ICD-10-CM | POA: Diagnosis not present

## 2017-09-11 DIAGNOSIS — M546 Pain in thoracic spine: Secondary | ICD-10-CM | POA: Diagnosis not present

## 2017-09-11 DIAGNOSIS — M9903 Segmental and somatic dysfunction of lumbar region: Secondary | ICD-10-CM | POA: Diagnosis not present

## 2017-09-11 DIAGNOSIS — M531 Cervicobrachial syndrome: Secondary | ICD-10-CM | POA: Diagnosis not present

## 2017-09-11 DIAGNOSIS — M545 Low back pain: Secondary | ICD-10-CM | POA: Diagnosis not present

## 2017-09-20 DIAGNOSIS — Z79899 Other long term (current) drug therapy: Secondary | ICD-10-CM | POA: Diagnosis not present

## 2017-09-20 DIAGNOSIS — B351 Tinea unguium: Secondary | ICD-10-CM | POA: Diagnosis not present

## 2017-10-09 DIAGNOSIS — M531 Cervicobrachial syndrome: Secondary | ICD-10-CM | POA: Diagnosis not present

## 2017-10-09 DIAGNOSIS — M546 Pain in thoracic spine: Secondary | ICD-10-CM | POA: Diagnosis not present

## 2017-10-09 DIAGNOSIS — M9902 Segmental and somatic dysfunction of thoracic region: Secondary | ICD-10-CM | POA: Diagnosis not present

## 2017-10-09 DIAGNOSIS — M9901 Segmental and somatic dysfunction of cervical region: Secondary | ICD-10-CM | POA: Diagnosis not present

## 2017-10-09 DIAGNOSIS — M9903 Segmental and somatic dysfunction of lumbar region: Secondary | ICD-10-CM | POA: Diagnosis not present

## 2017-10-09 DIAGNOSIS — M545 Low back pain: Secondary | ICD-10-CM | POA: Diagnosis not present

## 2017-11-06 DIAGNOSIS — M9903 Segmental and somatic dysfunction of lumbar region: Secondary | ICD-10-CM | POA: Diagnosis not present

## 2017-11-06 DIAGNOSIS — M531 Cervicobrachial syndrome: Secondary | ICD-10-CM | POA: Diagnosis not present

## 2017-11-06 DIAGNOSIS — M545 Low back pain: Secondary | ICD-10-CM | POA: Diagnosis not present

## 2017-11-06 DIAGNOSIS — M9901 Segmental and somatic dysfunction of cervical region: Secondary | ICD-10-CM | POA: Diagnosis not present

## 2017-11-06 DIAGNOSIS — M546 Pain in thoracic spine: Secondary | ICD-10-CM | POA: Diagnosis not present

## 2017-11-06 DIAGNOSIS — M9902 Segmental and somatic dysfunction of thoracic region: Secondary | ICD-10-CM | POA: Diagnosis not present

## 2017-12-04 DIAGNOSIS — M546 Pain in thoracic spine: Secondary | ICD-10-CM | POA: Diagnosis not present

## 2017-12-04 DIAGNOSIS — M9902 Segmental and somatic dysfunction of thoracic region: Secondary | ICD-10-CM | POA: Diagnosis not present

## 2017-12-04 DIAGNOSIS — M545 Low back pain: Secondary | ICD-10-CM | POA: Diagnosis not present

## 2017-12-04 DIAGNOSIS — M531 Cervicobrachial syndrome: Secondary | ICD-10-CM | POA: Diagnosis not present

## 2017-12-04 DIAGNOSIS — M9901 Segmental and somatic dysfunction of cervical region: Secondary | ICD-10-CM | POA: Diagnosis not present

## 2017-12-04 DIAGNOSIS — M9903 Segmental and somatic dysfunction of lumbar region: Secondary | ICD-10-CM | POA: Diagnosis not present

## 2017-12-14 DIAGNOSIS — M3501 Sicca syndrome with keratoconjunctivitis: Secondary | ICD-10-CM | POA: Diagnosis not present

## 2018-01-01 DIAGNOSIS — M545 Low back pain: Secondary | ICD-10-CM | POA: Diagnosis not present

## 2018-01-01 DIAGNOSIS — M531 Cervicobrachial syndrome: Secondary | ICD-10-CM | POA: Diagnosis not present

## 2018-01-01 DIAGNOSIS — M9901 Segmental and somatic dysfunction of cervical region: Secondary | ICD-10-CM | POA: Diagnosis not present

## 2018-01-01 DIAGNOSIS — M546 Pain in thoracic spine: Secondary | ICD-10-CM | POA: Diagnosis not present

## 2018-01-01 DIAGNOSIS — M9902 Segmental and somatic dysfunction of thoracic region: Secondary | ICD-10-CM | POA: Diagnosis not present

## 2018-01-01 DIAGNOSIS — M9903 Segmental and somatic dysfunction of lumbar region: Secondary | ICD-10-CM | POA: Diagnosis not present

## 2018-01-04 ENCOUNTER — Telehealth: Payer: Self-pay | Admitting: Internal Medicine

## 2018-01-04 NOTE — Telephone Encounter (Signed)
Please advise 

## 2018-01-04 NOTE — Telephone Encounter (Signed)
Spoke with pt and explained to her that she would need to call the pharmacy and verify that the manufacturer and lot number of his medication has been recalled or not. Pt's wife stated that she would give them a call and call us back to let us know.

## 2018-01-04 NOTE — Telephone Encounter (Signed)
Copied from [place] [id number]. Topic: Quick Communication - See Telephone Encounter >> Jan 04, 2018 10:36 AM Conception Chancy, NT wrote: CRM for notification. See Telephone encounter for: 01/04/18.  Patient wife is calling and states that losartan-hydrochlorothiazide (HYZAAR) 50-12.5 MG tablet has been recalled. She states can this be switched.   [facility] [place] [address] [place] [address] Phone: [phone] Fax: [phone]

## 2018-02-06 ENCOUNTER — Other Ambulatory Visit: Payer: Self-pay | Admitting: Internal Medicine

## 2018-02-10 ENCOUNTER — Other Ambulatory Visit: Payer: Self-pay | Admitting: Internal Medicine

## 2018-02-17 ENCOUNTER — Ambulatory Visit
Admission: EM | Admit: 2018-02-17 | Discharge: 2018-02-17 | Disposition: A | Discharge status: Home / Self Care | Payer: Medicare Other | Attending: Family Medicine | Admitting: Family Medicine

## 2018-02-17 DIAGNOSIS — J0110 Acute frontal sinusitis, unspecified: Secondary | ICD-10-CM | POA: Diagnosis not present

## 2018-02-17 MED ORDER — DOXYCYCLINE HYCLATE 100 MG PO CAPS: 100.0000 mg | ORAL_CAPSULE | Freq: Two times a day (BID) | ORAL | 0 refills | Status: AC

## 2018-02-17 NOTE — ED Provider Notes (Signed)
[facility]    CSN: [id number] Arrival date & time: 02/17/18  1242     History   Chief Complaint Chief Complaint  Patient presents with  . Cough    HPI Austin Duran is a 72 y.o. male.   72 year old male presents with nasal congestion, sinus pressure and sinus headache for over 1 week. Also having some throat irritation and coughing up discolored mucus in the morning. Denies any fever or GI symptoms. Has taken OTC allergy and sinus medications with some relief. No other family members ill. Has history of HTN, PVC's and depression and currently on Hyzaar, Cartia XT, aspirin and Celexa daily.   The history is provided by the patient.    Past Medical History:  Diagnosis Date  . Depression   . Hearing loss    had a perforated right TM,   followed by Dr. Tami Ribas  . Hypertension   . Panic attacks   . PVC's (premature ventricular contractions)     Patient Active Problem List   Diagnosis Date Noted  . Exertional dyspnea 02/06/2017  . Encounter for preventive health examination 07/18/2015  . OSA (obstructive sleep apnea) 07/13/2014  . B12 deficiency 07/10/2014  . Cerumen impaction 07/10/2014  . Vitamin D deficiency 07/03/2013  . Enlarged prostate without lower urinary tract symptoms (luts) 07/02/2013  . Bilateral neuropathy of upper extremities 07/02/2013  . Hypertension 07/06/2012  . Basal cell carcinoma of back 01/02/2012  . Hearing loss, sensorineural, asymmetrical 01/02/2012  . S/P bilateral cataract extraction 01/02/2012  . Encounter for Medicare annual wellness exam 01/02/2012  . Obesity (BMI 30-39.9) 01/02/2012  . Screening for colon cancer 08/07/2011  . Screening for prostate cancer 08/07/2011  . Dyslipidemia with elevated low density lipoprotein cholesterol and abnormally low high density lipoprotein cholesterol 08/07/2011  . Lumbago 08/07/2011    Past Surgical History:  Procedure Laterality Date  . CATARACT EXTRACTION Bilateral Aug 2013   Porfilio  . CATARACT EXTRACTION, BILATERAL  May 2013   Porfilio  . EYE SURGERY  2008   eyelid surgery, Dr. Loletta Specter  . FRACTURE SURGERY    . [facility] , right        Home Medications    Prior to Admission medications   Medication Sig Start Date End Date Taking? Authorizing Provider  aspirin 81 MG tablet Take 81 mg by mouth daily.     Yes [provider]  CARTIA XT 120 MG 24 hr capsule TAKE ONE CAPSULE BY MOUTH DAILY 02/12/18  Yes Deboraha Sprang, MD  Cholecalciferol (VITAMIN D-3 PO) Take 1 tablet by mouth daily.   Yes [provider]  citalopram (CELEXA) 20 MG tablet TAKE 1 TABLET(20 MG) BY MOUTH DAILY 02/06/18  Yes [name], MD  losartan-hydrochlorothiazide (HYZAAR) 50-12.5 MG tablet Take 1 tablet by mouth daily. 08/09/17  Yes [name], MD  doxycycline (VIBRAMYCIN) 100 MG capsule Take 1 capsule (100 mg total) by mouth 2 (two) times daily for 7 days. 02/17/18 02/24/18  Katy Apo, NP    Family History Family History  Problem Relation Age of Onset  . Cancer Maternal Aunt        lung  . Cancer Paternal Uncle        colon    Social History Social History   Tobacco Use  . Smoking status: Former Smoker    Last attempt to quit: 08/03/2004    Years since quitting: 13.5  .  Smokeless tobacco: Never Used  Substance Use Topics  . Alcohol use: Yes    Alcohol/week: 3.0 oz    Types: 5 Standard drinks or equivalent per week  . Drug use: No     Allergies   Augmentin [amoxicillin-pot clavulanate] and Lisinopril   Review of Systems Review of Systems  Constitutional: Positive for fatigue. Negative for activity change, appetite change, chills and fever.  HENT: Positive for congestion, postnasal drip, sinus pressure, sinus pain and sore throat. Negative for ear discharge, ear pain, mouth sores, nosebleeds, rhinorrhea, sneezing and trouble swallowing.   Eyes: Negative for pain, discharge, redness and itching.    Respiratory: Positive for cough. Negative for chest tightness, shortness of breath and wheezing.   Cardiovascular: Negative for chest pain and palpitations.  Gastrointestinal: Negative for diarrhea, nausea and vomiting.  Musculoskeletal: Negative for arthralgias, myalgias, neck pain and neck stiffness.  Skin: Negative for rash and wound.  Neurological: Positive for headaches. Negative for dizziness, tremors, seizures, syncope, weakness and light-headedness.  Hematological: Negative for adenopathy. Does not bruise/bleed easily.  Psychiatric/Behavioral: Negative.      Physical Exam Triage Vital Signs ED Triage Vitals  Enc Vitals Group     BP 02/17/18 1252 115/71     Pulse Rate 02/17/18 1252 69     Resp 02/17/18 1252 16     Temp 02/17/18 1252 98.3 F (36.8 C)     Temp Source 02/17/18 1252 Oral     SpO2 02/17/18 1252 97 %     Weight 02/17/18 1251 212 lb (96.2 kg)     Height 02/17/18 1251 5\' 8"  (1.727 m)     Head Circumference --      Peak Flow --      Pain Score 02/17/18 1250 0     Pain Loc --      Pain Edu? --      Excl. in [place]? --    No data found.  Updated Vital Signs BP 115/71 (BP Location: Left Arm)   Pulse 69   Temp 98.3 F (36.8 C) (Oral)   Resp 16   Ht 5\' 8"  (1.727 m)   Wt 212 lb (96.2 kg)   SpO2 97%   BMI 32.23 kg/m   Visual Acuity Right Eye Distance:   Left Eye Distance:   Bilateral Distance:    Right Eye Near:   Left Eye Near:    Bilateral Near:     Physical Exam  Constitutional: He is oriented to person, place, and time. Vital signs are normal. He appears well-developed and well-nourished. He does not appear ill. No distress.  HENT:  Head: Normocephalic and atraumatic.  Right Ear: External ear and ear canal normal. Tympanic membrane is scarred.  Left Ear: Hearing, external ear and ear canal normal. Tympanic membrane is bulging. A middle ear effusion is present.  Nose: Mucosal edema present. Right sinus exhibits frontal sinus tenderness. Right sinus  exhibits no maxillary sinus tenderness. Left sinus exhibits frontal sinus tenderness. Left sinus exhibits no maxillary sinus tenderness.  Mouth/Throat: Uvula is midline and mucous membranes are normal. Oropharyngeal exudate (yellowish post nasal drainage present) present.  Eyes: Conjunctivae and EOM are normal.  Neck: Normal range of motion. Neck supple.  Cardiovascular: Normal rate and normal heart sounds. An irregular rhythm present.  Pulmonary/Chest: Effort normal. No respiratory distress. He has no decreased breath sounds. He has no wheezes. He has no rhonchi. He has no rales.  Musculoskeletal: Normal range of motion.  Lymphadenopathy:    He has  no cervical adenopathy.  Neurological: He is alert and oriented to person, place, and time.  Skin: Skin is warm and dry. Capillary refill takes less than 2 seconds. No rash noted.  Psychiatric: He has a normal mood and affect. His behavior is normal. Judgment and thought content normal.  Vitals reviewed.    UC Treatments / Results  Labs (all labs ordered are listed, but only abnormal results are displayed) Labs Reviewed - No data to display  EKG None  Radiology No results found.  Procedures Procedures (including critical care time)  Medications Ordered in UC Medications - No data to display  Initial Impression / Assessment and Plan / UC Course  I have reviewed the triage vital signs and the nursing notes.  Pertinent labs & imaging results that were available during my care of the patient were reviewed by me and considered in my medical decision making (see chart for details).     Reviewed with patient that he probably has a mild frontal sinus infection. May start Doxycycline 100mg  twice a day as directed. May continue OTC sinus medications as needed. Follow-up with his PCP as planned.  Final Clinical Impressions(s) / UC Diagnoses   Final diagnoses:  Acute non-recurrent frontal sinusitis     Discharge Instructions       Recommend start Doxycycline 100mg  twice a day as directed. May continue OTC allergy and sinus medications for symptoms as needed. Increase fluids to help loosen up mucus. Follow-up with your PCP in 4 to 5 days if not improving.     ED Prescriptions    Medication Sig Dispense Auth. Provider   doxycycline (VIBRAMYCIN) 100 MG capsule Take 1 capsule (100 mg total) by mouth 2 (two) times daily for 7 days. 14 capsule Katy Apo, NP     Controlled Substance Prescriptions [employer] Controlled Substance Registry consulted? Not Applicable   Katy Apo, NP 02/17/18 1335

## 2018-02-17 NOTE — Discharge Instructions (Addendum)
Recommend start Doxycycline 100mg  twice a day as directed. May continue OTC allergy and sinus medications for symptoms as needed. Increase fluids to help loosen up mucus. Follow-up with your PCP in 4 to 5 days if not improving.

## 2018-02-17 NOTE — ED Triage Notes (Signed)
As per patient could be allergies, nasal congestion, sinusitis onset week OTC not helping.

## 2018-03-01 ENCOUNTER — Encounter: Payer: Self-pay | Admitting: Internal Medicine

## 2018-03-01 ENCOUNTER — Ambulatory Visit (INDEPENDENT_AMBULATORY_CARE_PROVIDER_SITE_OTHER): Payer: Medicare Other | Admitting: Internal Medicine

## 2018-03-01 VITALS — BP 124/68 | HR 79 | Ht 68.0 in | Wt 213.0 lb

## 2018-03-01 DIAGNOSIS — I493 Ventricular premature depolarization: Secondary | ICD-10-CM | POA: Diagnosis not present

## 2018-03-01 MED ORDER — DILTIAZEM HCL ER COATED BEADS 120 MG PO CP24
120.0000 mg | ORAL_CAPSULE | Freq: Every day | ORAL | 3 refills | Status: DC
Start: 2018-03-01 — End: 2019-03-04

## 2018-03-01 NOTE — Patient Instructions (Signed)

## 2018-03-01 NOTE — Progress Notes (Signed)
      Patient Care Team: [name], MD as PCP - General (Internal Medicine)   HPI  Austin Duran is a 72 y.o. male Seen in follow-up for PVCs and dyspnea on exertion  Rx with [facility]   DATE TEST EF   7/18 Echo   55-65   %         Date Cr K  12//18 0.98 4.1        Tolerating medications without difficulty.  No palpitations.  Good energy.  Struggling to lose weight as he is having back pain and hip pain.  Records and Results Reviewed   Past Medical History:  Diagnosis Date  . Depression   . Hearing loss    had a perforated right TM,   followed by Dr. Tami Ribas  . Hypertension   . Panic attacks   . PVC's (premature ventricular contractions)     Past Surgical History:  Procedure Laterality Date  . CATARACT EXTRACTION Bilateral Aug 2013   Porfilio  . CATARACT EXTRACTION, BILATERAL  May 2013   Porfilio  . EYE SURGERY  2008   eyelid surgery, Dr. Loletta Specter  . FRACTURE SURGERY    . [facility] , right     Current Meds  Medication Sig  . aspirin 81 MG tablet Take 81 mg by mouth daily.    [name] CARTIA XT 120 MG 24 hr capsule TAKE ONE CAPSULE BY MOUTH DAILY  . Cholecalciferol (VITAMIN D-3 PO) Take 1 tablet by mouth daily.  . citalopram (CELEXA) 20 MG tablet TAKE 1 TABLET(20 MG) BY MOUTH DAILY  . losartan-hydrochlorothiazide (HYZAAR) 50-12.5 MG tablet Take 1 tablet by mouth daily.    Allergies  Allergen Reactions  . Augmentin [Amoxicillin-Pot Clavulanate] Nausea Only  . Lisinopril Cough      Review of Systems negative except from HPI and PMH  Physical Exam BP 124/68 (BP Location: Left Arm, Patient Position: Sitting, Cuff Size: Normal)   Pulse 79   Ht 5\' 8"  (1.727 m)   Wt 213 lb (96.6 kg)   BMI 32.39 kg/m  Well developed and well nourished in no acute distress HENT normal E scleral and icterus clear Neck Supple JVP flat; carotids brisk and full Clear to ausculation Regular rate and rhythm, no murmurs gallops or rub Soft with  active bowel sounds No clubbing cyanosis  Edema Alert and oriented, grossly normal motor and sensory function Skin Warm and Dry  Sinus rhythm at 79 Intervals 14/08/41  Assessment and  Plan  PVCs-RVOT-exercise associated  Hypertension with hypertensive heart disease  Obesity-BMI greater than 30   Blood pressure well controlled  No significant palpitations  Continue current medications  Suggested exercising in the pool  Current medicines are reviewed at length with the patient today .  The patient does not  have concerns regarding medicines.

## 2018-04-19 DIAGNOSIS — D2272 Melanocytic nevi of left lower limb, including hip: Secondary | ICD-10-CM | POA: Diagnosis not present

## 2018-04-19 DIAGNOSIS — L720 Epidermal cyst: Secondary | ICD-10-CM | POA: Diagnosis not present

## 2018-04-19 DIAGNOSIS — L918 Other hypertrophic disorders of the skin: Secondary | ICD-10-CM | POA: Diagnosis not present

## 2018-04-19 DIAGNOSIS — Z85828 Personal history of other malignant neoplasm of skin: Secondary | ICD-10-CM | POA: Diagnosis not present

## 2018-04-19 DIAGNOSIS — L821 Other seborrheic keratosis: Secondary | ICD-10-CM | POA: Diagnosis not present

## 2018-04-19 DIAGNOSIS — B351 Tinea unguium: Secondary | ICD-10-CM | POA: Diagnosis not present

## 2018-04-19 DIAGNOSIS — D485 Neoplasm of uncertain behavior of skin: Secondary | ICD-10-CM | POA: Diagnosis not present

## 2018-04-19 DIAGNOSIS — L578 Other skin changes due to chronic exposure to nonionizing radiation: Secondary | ICD-10-CM | POA: Diagnosis not present

## 2018-04-19 DIAGNOSIS — D1800 Hemangioma unspecified site: Secondary | ICD-10-CM | POA: Diagnosis not present

## 2018-04-19 DIAGNOSIS — Z1283 Encounter for screening for malignant neoplasm of skin: Secondary | ICD-10-CM | POA: Diagnosis not present

## 2018-05-27 DIAGNOSIS — Z23 Encounter for immunization: Secondary | ICD-10-CM | POA: Diagnosis not present

## 2018-06-10 ENCOUNTER — Other Ambulatory Visit: Payer: Self-pay | Admitting: Internal Medicine

## 2018-06-27 DIAGNOSIS — S46011A Strain of muscle(s) and tendon(s) of the rotator cuff of right shoulder, initial encounter: Secondary | ICD-10-CM | POA: Diagnosis not present

## 2018-06-29 ENCOUNTER — Telehealth: Payer: Self-pay | Admitting: Internal Medicine

## 2018-06-29 NOTE — Telephone Encounter (Signed)
I cannot refill a controlled substance without an evaluation since it  Has not been prescribed in over 5 years

## 2018-06-29 NOTE — Telephone Encounter (Signed)
patientnotified and will be in on Tuesday for medication appointment.

## 2018-06-29 NOTE — Telephone Encounter (Signed)
Copied from [place] [id number]. Topic: Quick Communication - Rx Refill/Question >> Jun 29, 2018  9:04 AM Sheran Luz wrote: Medication: ALPRAZolam Duanne Moron) 0.5 MG tablet   Pts wife, Amy, calling to request refill of this medication stating that pt has been experiencing what he thinks are panic attacks. Amy states that pt is having trouble sleeping at night with periods of feeling really hot/flushed. Advised wife that pt needs appointment-wife states they cannot come in today but agreed to appt on 10/29. Wife requesting partial refill of this medication to help pt until appointment.  Please advise.  Last OV : 08/09/17  Preferred Pharmacy (with phone number or street name):[facility] - [place], [place] [address] AT [place] [phone] (Phone) [phone] (Fax)

## 2018-06-29 NOTE — Telephone Encounter (Signed)
Requested medication (s) are due for refill today: No  Requested medication (s) are on the active medication list: No  Last refill:  N/a  Future visit scheduled: Yes  Notes to clinic: Asking for Xanax 0.5 mg tablet refill. Wife reports Dr. Derrel Nip ordered this for pt. In 2012 to take as needed. Does have an appointment for next week. Reports pt. Has been having panic attacks and not sleeping well.    There are no medications in this encounter.

## 2018-07-03 ENCOUNTER — Encounter: Payer: Self-pay | Admitting: Internal Medicine

## 2018-07-03 ENCOUNTER — Ambulatory Visit (INDEPENDENT_AMBULATORY_CARE_PROVIDER_SITE_OTHER): Payer: Medicare Other | Admitting: Internal Medicine

## 2018-07-03 VITALS — BP 140/80 | HR 63 | Temp 98.4°F | Resp 15 | Ht 68.0 in | Wt 214.4 lb

## 2018-07-03 DIAGNOSIS — R7301 Impaired fasting glucose: Secondary | ICD-10-CM

## 2018-07-03 DIAGNOSIS — G4733 Obstructive sleep apnea (adult) (pediatric): Secondary | ICD-10-CM | POA: Diagnosis not present

## 2018-07-03 DIAGNOSIS — I4891 Unspecified atrial fibrillation: Secondary | ICD-10-CM | POA: Insufficient documentation

## 2018-07-03 DIAGNOSIS — I10 Essential (primary) hypertension: Secondary | ICD-10-CM

## 2018-07-03 DIAGNOSIS — E785 Hyperlipidemia, unspecified: Secondary | ICD-10-CM

## 2018-07-03 DIAGNOSIS — F5104 Psychophysiologic insomnia: Secondary | ICD-10-CM | POA: Diagnosis not present

## 2018-07-03 DIAGNOSIS — G4700 Insomnia, unspecified: Secondary | ICD-10-CM | POA: Insufficient documentation

## 2018-07-03 DIAGNOSIS — I471 Supraventricular tachycardia, unspecified: Secondary | ICD-10-CM

## 2018-07-03 MED ORDER — TRAZODONE HCL 50 MG PO TABS: 25.0000 mg | ORAL_TABLET | Freq: Every evening | ORAL | 3 refills | Status: DC | PRN

## 2018-07-03 NOTE — Patient Instructions (Addendum)
I think your panic attacks are not true panic attacks,  But episodes of atrial fibrillation  Please make an appointment with Dr Caryl Comes ASAP to have a Holter monitor placed so your  Heart rhythm can be monitored   I cannot advise you to use alprazolam for sleep issues as long as you have untreated sleep apnea  I am prescribing trazodone to take instead for insomnia.  Take it one hour before bedtime

## 2018-07-03 NOTE — Assessment & Plan Note (Addendum)
Patient reports a recent episode  of SVT lasting 3-4 hours,  Measured pulse at home was 140 bpm. His recent "panic attacks" are atypical and suggestive of PAF vs SVT.  He is already taking diltiazem 120 mg daily .  I have advised him  To return to Dr. Caryl Comes for  30 day  monitor.

## 2018-07-03 NOTE — Assessment & Plan Note (Addendum)
10 yr risk is 11% , with no history of DM or CAD.  Austin Duran  Advised to follow low glycemic index diet and recheck in 6 months.  Lab Results  Component Value Date   CHOL 116 08/09/2017   HDL 31.10 (L) 08/09/2017   LDLCALC 66 08/09/2017   TRIG 93.0 08/09/2017   CHOLHDL 4 08/09/2017   Lab Results  Component Value Date   HGBA1C 5.7 08/09/2017

## 2018-07-03 NOTE — Assessment & Plan Note (Signed)
Request for alprazolam use discouraged given untreated OSA.  Trial of trazodone

## 2018-07-03 NOTE — Assessment & Plan Note (Signed)
Untreated for the last 4 years due to mask intolerance.  If his Holter monitor reveals SVT or PAF at night,  He will agree to a home sleep study

## 2018-07-03 NOTE — Progress Notes (Addendum)
Subjective:  Patient ID: Austin Duran, male    DOB: 26-Feb-1946  Age: 72 y.o. MRN: [id number]  CC: The primary encounter diagnosis was Essential hypertension. Diagnoses of Dyslipidemia with elevated low density lipoprotein cholesterol and abnormally low high density lipoprotein cholesterol, Impaired fasting glucose, SVT (supraventricular tachycardia) ([place]), OSA (obstructive sleep apnea), and Psychophysiological insomnia were also pertinent to this visit.  HPI Austin Duran presents for treatment of GAD with recent onset of panic attacks, self described,    Patient Requesting alprazolam which had not been used (or prescribed)  in years.    First episosde occurred at night while dozing on the couch , Watching TV late at night. Felt it" coming on," insides felt jumpy.  acc'd by diaphoresis    Last 2 were treated with alprazolam and ;he was able to go to sleep .  Also notes that his dreams were "less crazy" (not nightmares)   Takes cardizem 120 mg in the am    One month prior to first episode had an episode of SVT,  measured rate of 140,  Lasted  3-4 hours .  Drinks caffeine free sodas but eats chocolate every night . Takes dilitiazem 120 mg I AM for historyof SVT      Has OSA , but not wearing  CPAP since diagnosis since 2015  Due to mask intolerance.    .    Outpatient Medications Prior to Visit  Medication Sig Dispense Refill  . aspirin 81 MG tablet Take 81 mg by mouth daily.      . Cholecalciferol (VITAMIN D-3 PO) Take 1 tablet by mouth daily.    . citalopram (CELEXA) 20 MG tablet TAKE 1 TABLET(20 MG) BY MOUTH DAILY 90 tablet 1  . diltiazem (CARTIA XT) 120 MG 24 hr capsule Take 1 capsule (120 mg total) by mouth daily. 90 capsule 3  . losartan-hydrochlorothiazide (HYZAAR) 50-12.5 MG tablet TAKE 1 TABLET BY MOUTH DAILY 90 tablet 0   No facility-administered medications prior to visit.     Review of Systems;  Patient denies headache, fevers, malaise, unintentional weight  loss, skin rash, eye pain, sinus congestion and sinus pain, sore throat, dysphagia,  hemoptysis , cough, dyspnea, wheezing, chest pain, palpitations, orthopnea, edema, abdominal pain, nausea, melena, diarrhea, constipation, flank pain, dysuria, hematuria, urinary  Frequency, nocturia, numbness, tingling, seizures,  Focal weakness, Loss of consciousness,  Tremor, insomnia, depression, anxiety, and suicidal ideation.      Objective:  BP 140/80 (BP Location: Left Arm, Patient Position: Sitting, Cuff Size: Large)   Pulse 63   Temp 98.4 F (36.9 C) (Oral)   Resp 15   Ht 5\' 8"  (1.727 m)   Wt 214 lb 6 oz (97.2 kg)   SpO2 97%   BMI 32.60 kg/m   BP Readings from Last 3 Encounters:  07/03/18 140/80  03/01/18 124/68  02/17/18 115/71    Wt Readings from Last 3 Encounters:  07/03/18 214 lb 6 oz (97.2 kg)  03/01/18 213 lb (96.6 kg)  02/17/18 212 lb (96.2 kg)    General appearance: alert, cooperative and appears stated age Ears: normal TM's and external ear canals both ears Throat: lips, mucosa, and tongue normal; teeth and gums normal Neck: no adenopathy, no carotid bruit, supple, symmetrical, trachea midline and thyroid not enlarged, symmetric, no tenderness/mass/nodules Back: symmetric, no curvature. ROM normal. No CVA tenderness. Lungs: clear to auscultation bilaterally Heart: regular rate and rhythm, S1, S2 normal, no murmur, click, rub or gallop Abdomen: soft, non-tender; bowel  sounds normal; no masses,  no organomegaly Pulses: 2+ and symmetric Skin: Skin color, texture, turgor normal. No rashes or lesions Lymph nodes: Cervical, supraclavicular, and axillary nodes normal.  Lab Results  Component Value Date   HGBA1C 5.8 07/03/2018   HGBA1C 5.7 08/09/2017   HGBA1C 5.7 10/06/2016    Lab Results  Component Value Date   CREATININE 1.17 07/03/2018   CREATININE 0.98 08/09/2017   CREATININE 1.12 04/11/2017    Lab Results  Component Value Date   WBC 13.4 (H) 07/03/2018   HGB  14.9 07/03/2018   HCT 43.2 07/03/2018   PLT 333.0 07/03/2018   GLUCOSE 95 07/03/2018   CHOL 111 07/03/2018   TRIG 100.0 07/03/2018   HDL 40.70 07/03/2018   LDLCALC 50 07/03/2018   ALT 14 07/03/2018   AST 13 07/03/2018   NA 137 07/03/2018   K 4.2 07/03/2018   CL 102 07/03/2018   CREATININE 1.17 07/03/2018   BUN 28 (H) 07/03/2018   CO2 27 07/03/2018   TSH 1.16 07/03/2018   PSA 1.09 10/06/2016   HGBA1C 5.8 07/03/2018    No results found.  Assessment & Plan:   Problem List Items Addressed This Visit    Dyslipidemia with elevated low density lipoprotein cholesterol and abnormally low high density lipoprotein cholesterol    10 yr risk is 11% , with no history of DM or CAD.  [name]  Advised to follow low glycemic index diet and recheck in 6 months.  Lab Results  Component Value Date   CHOL 116 08/09/2017   HDL 31.10 (L) 08/09/2017   LDLCALC 66 08/09/2017   TRIG 93.0 08/09/2017   CHOLHDL 4 08/09/2017   Lab Results  Component Value Date   HGBA1C 5.7 08/09/2017         Relevant Orders   Lipid panel (Completed)   Hypertension - Primary   Insomnia    Request for alprazolam use discouraged given untreated OSA.  Trial of trazodone       OSA (obstructive sleep apnea)    Untreated for the last 4 years due to mask intolerance.  If his Holter monitor reveals SVT or PAF at night,  He will agree to a home sleep study       SVT (supraventricular tachycardia) ([place])    Patient reports a recent episode  of SVT lasting 3-4 hours,  Measured pulse at home was 140 bpm. His recent "panic attacks" are atypical and suggestive of PAF vs SVT.  He is already taking diltiazem 120 mg daily .  I have advised him  To return to Dr. Caryl Comes for  30 day  monitor.      Relevant Orders   TSH (Completed)   Magnesium (Completed)   Comprehensive metabolic panel (Completed)   CBC with Differential/Platelet (Completed)    Other Visit Diagnoses    Impaired fasting glucose       Relevant Orders    Hemoglobin A1c (Completed)    A total of 25 minutes of face to face time was spent with patient more than half of which was spent in counselling about the above mentioned conditions  and coordination of care   I am having Otho Ket. Marzetta Board "Ronalee Belts" start on traZODone. I am also having him maintain his aspirin, Cholecalciferol (VITAMIN D-3 PO), citalopram, diltiazem, and losartan-hydrochlorothiazide.  Meds ordered this encounter  Medications  . traZODone (DESYREL) 50 MG tablet    Sig: Take 0.5-1 tablets (25-50 mg total) by mouth at bedtime as needed for sleep.  Dispense:  30 tablet    Refill:  3    There are no discontinued medications.  Follow-up: No follow-ups on file.   [name], MD

## 2018-07-04 ENCOUNTER — Telehealth: Payer: Self-pay | Admitting: Internal Medicine

## 2018-07-04 DIAGNOSIS — I471 Supraventricular tachycardia: Secondary | ICD-10-CM

## 2018-07-04 LAB — HEMOGLOBIN A1C: HEMOGLOBIN A1C: 5.8 % (ref 4.6–6.5)

## 2018-07-04 LAB — CBC WITH DIFFERENTIAL/PLATELET
BASOS PCT: 0.5 % (ref 0.0–3.0)
Basophils Absolute: 0.1 10*3/uL (ref 0.0–0.1)
Eosinophils Absolute: 0.1 10*3/uL (ref 0.0–0.7)
Eosinophils Relative: 0.4 % (ref 0.0–5.0)
HCT: 43.2 % (ref 39.0–52.0)
Hemoglobin: 14.9 g/dL (ref 13.0–17.0)
LYMPHS ABS: 1.8 10*3/uL (ref 0.7–4.0)
Lymphocytes Relative: 13.4 % (ref 12.0–46.0)
MCHC: 34.5 g/dL (ref 30.0–36.0)
MCV: 90.3 fl (ref 78.0–100.0)
MONO ABS: 1.8 10*3/uL — AB (ref 0.1–1.0)
Monocytes Relative: 13.5 % — ABNORMAL HIGH (ref 3.0–12.0)
NEUTROS ABS: 9.7 10*3/uL — AB (ref 1.4–7.7)
NEUTROS PCT: 72.2 % (ref 43.0–77.0)
PLATELETS: 333 10*3/uL (ref 150.0–400.0)
RBC: 4.78 Mil/uL (ref 4.22–5.81)
RDW: 13.4 % (ref 11.5–15.5)
WBC: 13.4 10*3/uL — ABNORMAL HIGH (ref 4.0–10.5)

## 2018-07-04 LAB — COMPREHENSIVE METABOLIC PANEL
ALT: 14 U/L (ref 0–53)
AST: 13 U/L (ref 0–37)
Albumin: 4.3 g/dL (ref 3.5–5.2)
Alkaline Phosphatase: 63 U/L (ref 39–117)
BUN: 28 mg/dL — ABNORMAL HIGH (ref 6–23)
CALCIUM: 9.6 mg/dL (ref 8.4–10.5)
CO2: 27 mEq/L (ref 19–32)
Chloride: 102 mEq/L (ref 96–112)
Creatinine, Ser: 1.17 mg/dL (ref 0.40–1.50)
GFR: 65.15 mL/min (ref >60.00)
GLUCOSE: 95 mg/dL (ref 70–99)
POTASSIUM: 4.2 meq/L (ref 3.5–5.1)
Sodium: 137 mEq/L (ref 135–145)
TOTAL PROTEIN: 6.7 g/dL (ref 6.0–8.3)
Total Bilirubin: 0.8 mg/dL (ref 0.2–1.2)

## 2018-07-04 LAB — TSH: TSH: 1.16 u[IU]/mL (ref 0.35–4.50)

## 2018-07-04 LAB — LIPID PANEL
Cholesterol: 111 mg/dL (ref 0–200)
HDL: 40.7 mg/dL (ref >39.00)
LDL Cholesterol: 50 mg/dL (ref 0–99)
NonHDL: 70.21
TRIGLYCERIDES: 100 mg/dL (ref 0.0–149.0)
Total CHOL/HDL Ratio: 3
VLDL: 20 mg/dL (ref 0.0–40.0)

## 2018-07-04 LAB — MAGNESIUM: MAGNESIUM: 2.5 mg/dL (ref 1.5–2.5)

## 2018-07-04 NOTE — Telephone Encounter (Signed)
New Message    Patients wife is calling on behalf of patient. She states that he went to see Dr. Derrel Nip on 10/29. Dr. Derrel Nip wanted him to make appointment to come in and see Dr. Caryl Comes to get a heart monitor to check the patients heart rhythm but there is no order for the monitor. (Notes are in Epic). Patient is wondering can Dr. Caryl Comes just place the order for the heart monitor so that they do not have to come in twice. Please call to discuss.

## 2018-07-04 NOTE — Telephone Encounter (Signed)
I do not know how to order  A MONITOR THAT CAN BE WORN FOR 30 DAYS CNA YOU ORDER IT FOR ME?

## 2018-07-04 NOTE — Telephone Encounter (Signed)
Dr. Derrel Nip this can be ordered by you and done here in at [address]. This would assist the patient avoid another office visit. Let me know if you need assistance placing the order :)

## 2018-07-05 NOTE — Telephone Encounter (Signed)
Can someone help her order this please?

## 2018-07-05 NOTE — Telephone Encounter (Signed)
I am not trying to delay this, but do you want a 30 day Event monitor or a 14 day (Holter-mentioned in your note).  Thank you so much.

## 2018-07-05 NOTE — Telephone Encounter (Signed)
Dr. Derrel Nip please let us know a diagnosis for monitor and we will place an order under your name. Thanks

## 2018-07-05 NOTE — Telephone Encounter (Signed)
You can use SVT,  Thank you!

## 2018-07-05 NOTE — Telephone Encounter (Signed)
30 day please,  As stated in my message.  I will change the note

## 2018-07-05 NOTE — Telephone Encounter (Signed)
Spoke to patient and informed him that we will call him to schedule his 30 day Event Monitor.  He verbalized understanding.

## 2018-07-06 ENCOUNTER — Other Ambulatory Visit: Payer: Self-pay | Admitting: Internal Medicine

## 2018-07-06 DIAGNOSIS — D72829 Elevated white blood cell count, unspecified: Secondary | ICD-10-CM

## 2018-07-11 DIAGNOSIS — M7541 Impingement syndrome of right shoulder: Secondary | ICD-10-CM | POA: Diagnosis not present

## 2018-07-16 ENCOUNTER — Ambulatory Visit: Payer: Medicare Other

## 2018-07-16 ENCOUNTER — Telehealth: Payer: Self-pay | Admitting: *Deleted

## 2018-07-16 DIAGNOSIS — R002 Palpitations: Secondary | ICD-10-CM

## 2018-07-16 NOTE — Telephone Encounter (Signed)
Patient came in today for 30 day monitor placement ordered by Dr Derrel Nip. Unfortunately, we do not place [employer] 30-day event monitors on patient's here in our office. Typically, these are mailed to the patient's home. Dr Derrel Nip ordered the event monitor for SVT vs PAF per her office visit note.  Spoke with patient. He states he's been having episodes of fluttering in his heart and getting hot. He says this is new since his last visit with Dr Caryl Comes in June. He feels like his heart rate is elevated at times. He recently recorded his HR at home to be 140 and lasting 3-4 hours per Dr Lupita Dawn note from 07/03/18. This past Saturday, patient was doing yard work and felt "different." He sat down and felt like his heart was skipping a beat every 3-4 beats.   Advised patient we could schedule him to see Dr Caryl Comes at next available which is this Thursday, 07/20/18 at 11:30 am. Patient agreed. I will also route to Dr Caryl Comes for review and any further orders prior to appointment.

## 2018-07-17 ENCOUNTER — Other Ambulatory Visit: Payer: Self-pay | Admitting: Orthopedic Surgery

## 2018-07-17 NOTE — Telephone Encounter (Signed)
Patient decided he would rather have monitor placed and then 3 wk fu but only if he can come have placed 11/14 at 1130 .  If he cannot he will keep ov and place monitor

## 2018-07-17 NOTE — Telephone Encounter (Signed)
Spoke with HM-RN.  Will place ZO patch.  Glad to see the patient on Thursday as scheduled or can see him in 3 weeks once the data are back.  Echocardiogram from 2018 reviewed.  Normal LV function.  If symptoms are concerning, may need to go to the emergency room

## 2018-07-17 NOTE — Telephone Encounter (Signed)
I left a message for the patient to call back 

## 2018-07-17 NOTE — Telephone Encounter (Signed)
I spoke with the patient's wife. She is aware we wanted to give the patient the option to:  1) see Dr. Caryl Comes on Thursday as scheduled and place a monitor then or  2) cancel the appointment with Dr. Caryl Comes on Thursday, just place a ZIO monitor and have him see Dr. Caryl Comes in 3 weeks.  Per Mrs. Baumler, she will review with the patient and call us back.

## 2018-07-17 NOTE — Telephone Encounter (Signed)
Pt wife is returning your call.  

## 2018-07-18 NOTE — Telephone Encounter (Signed)
Scheduling spoke with patient for me- moved his appt with Dr. Caryl Comes. Patient will come in for nurse visit on 11/14 for ZIO placement and see Dr. Caryl Comes on 12/3 for follow up.

## 2018-07-19 ENCOUNTER — Ambulatory Visit: Payer: Medicare Other | Admitting: Internal Medicine

## 2018-07-19 ENCOUNTER — Other Ambulatory Visit (INDEPENDENT_AMBULATORY_CARE_PROVIDER_SITE_OTHER): Payer: Medicare Other

## 2018-07-19 ENCOUNTER — Ambulatory Visit (INDEPENDENT_AMBULATORY_CARE_PROVIDER_SITE_OTHER): Payer: Medicare Other

## 2018-07-19 DIAGNOSIS — D72829 Elevated white blood cell count, unspecified: Secondary | ICD-10-CM

## 2018-07-19 DIAGNOSIS — R002 Palpitations: Secondary | ICD-10-CM | POA: Diagnosis not present

## 2018-07-19 LAB — CBC WITH DIFFERENTIAL/PLATELET
BASOS PCT: 0.8 % (ref 0.0–3.0)
Basophils Absolute: 0 10*3/uL (ref 0.0–0.1)
EOS PCT: 1.2 % (ref 0.0–5.0)
Eosinophils Absolute: 0.1 10*3/uL (ref 0.0–0.7)
HEMATOCRIT: 44.8 % (ref 39.0–52.0)
HEMOGLOBIN: 15.1 g/dL (ref 13.0–17.0)
LYMPHS PCT: 19.6 % (ref 12.0–46.0)
Lymphs Abs: 1.2 10*3/uL (ref 0.7–4.0)
MCHC: 33.8 g/dL (ref 30.0–36.0)
MCV: 91.2 fl (ref 78.0–100.0)
MONOS PCT: 10.7 % (ref 3.0–12.0)
Monocytes Absolute: 0.6 10*3/uL (ref 0.1–1.0)
NEUTROS PCT: 67.7 % (ref 43.0–77.0)
Neutro Abs: 4 10*3/uL (ref 1.4–7.7)
Platelets: 247 10*3/uL (ref 150.0–400.0)
RBC: 4.91 Mil/uL (ref 4.22–5.81)
RDW: 12.9 % (ref 11.5–15.5)
WBC: 5.9 10*3/uL (ref 4.0–10.5)

## 2018-07-20 ENCOUNTER — Other Ambulatory Visit: Payer: Self-pay | Admitting: Orthopedic Surgery

## 2018-07-20 DIAGNOSIS — M7541 Impingement syndrome of right shoulder: Secondary | ICD-10-CM

## 2018-07-26 ENCOUNTER — Ambulatory Visit
Admission: RE | Admit: 2018-07-26 | Discharge: 2018-07-26 | Disposition: A | Discharge status: Home / Self Care | Payer: Medicare Other | Source: Ambulatory Visit | Attending: Orthopedic Surgery | Admitting: Orthopedic Surgery

## 2018-07-26 DIAGNOSIS — M7541 Impingement syndrome of right shoulder: Secondary | ICD-10-CM | POA: Insufficient documentation

## 2018-08-07 ENCOUNTER — Ambulatory Visit: Payer: Medicare Other | Admitting: Internal Medicine

## 2018-08-12 ENCOUNTER — Other Ambulatory Visit: Payer: Self-pay | Admitting: Internal Medicine

## 2018-08-23 ENCOUNTER — Ambulatory Visit (INDEPENDENT_AMBULATORY_CARE_PROVIDER_SITE_OTHER): Payer: Medicare Other | Admitting: Internal Medicine

## 2018-08-23 ENCOUNTER — Encounter: Payer: Self-pay | Admitting: Internal Medicine

## 2018-08-23 VITALS — BP 131/69 | HR 90 | Ht 68.0 in | Wt 208.5 lb

## 2018-08-23 DIAGNOSIS — I493 Ventricular premature depolarization: Secondary | ICD-10-CM

## 2018-08-23 DIAGNOSIS — I471 Supraventricular tachycardia: Secondary | ICD-10-CM | POA: Diagnosis not present

## 2018-08-23 NOTE — Progress Notes (Signed)
      Patient Care Team: [name], MD as PCP - General (Internal Medicine)   HPI  Austin Duran is a 72 y.o. male Seen in follow-up for "PVCs" and atrial fib detected on recent event recorder    Personally reviewed demonstrated 2 episodes of atrial fibrillation one lasting 2-1/2 hours the other couple of minutes.  Rapid ventricular response.  Unassociated with symptoms.  Symptoms were associated with sinus rhythm.  This was his impression that it was related to anxiety.  DATE TEST EF   7/18 Echo   55-65   %         Date Cr K  12//18 0.98 4.1         Thromboembolic risk factors ( age -35, HTN-1) for a CHADSVASc Score of 2   Records and Results Reviewed   Past Medical History:  Diagnosis Date  . Depression   . Hearing loss    had a perforated right TM,   followed by Dr. Tami Ribas  . Hypertension   . Panic attacks   . PVC's (premature ventricular contractions)     Past Surgical History:  Procedure Laterality Date  . CATARACT EXTRACTION Bilateral Aug 2013   [facility]  . CATARACT EXTRACTION, BILATERAL  May 2013   [facility]  . EYE SURGERY  2008   eyelid surgery, Dr. Loletta Specter  . FRACTURE SURGERY    . [facility] , right     Current Meds  Medication Sig  . aspirin 81 MG tablet Take 81 mg by mouth daily.    . Cholecalciferol (VITAMIN D-3 PO) Take 1 tablet by mouth daily.  . citalopram (CELEXA) 20 MG tablet TAKE 1 TABLET(20 MG) BY MOUTH DAILY  . diltiazem (CARTIA XT) 120 MG 24 hr capsule Take 1 capsule (120 mg total) by mouth daily.  [name] losartan-hydrochlorothiazide (HYZAAR) 50-12.5 MG tablet TAKE 1 TABLET BY MOUTH DAILY  . traZODone (DESYREL) 50 MG tablet Take 0.5-1 tablets (25-50 mg total) by mouth at bedtime as needed for sleep.    Allergies  Allergen Reactions  . Augmentin [Amoxicillin-Pot Clavulanate] Nausea Only  . Lisinopril Cough      Review of Systems negative except from HPI and PMH  Physical Exam BP 131/69 (BP  Location: Left Arm, Patient Position: Sitting, Cuff Size: Normal)   Pulse 90   Ht 5\' 8"  (1.727 m)   Wt 208 lb 8 oz (94.6 kg)   BMI 31.70 kg/m  Well developed and nourished in no acute distress HENT normal Neck supple with JVP-flat Clear Regular rate and rhythm, no murmurs or gallops Abd-soft with active BS No Clubbing cyanosis edema Skin-warm and dry A & Oriented  Grossly normal sensory and motor function  Sinus @ 90 freq PACs and couplets NO PVCs    Assessment and  Plan  PVCs-RVOT-exercise associated  Hypertension with hypertensive heart disease  Obesity-BMI greater than 30  Atrial Fib SCAF  Atrial fibrillation of less than 3 hours duration, based on the ASSERT TRIAL, would not justify anticoagulation.  Hence, we have discussed alternative ways for looking for prolonged atrial fibrillation that would prompt initiation.  We have discussed both a Fitbit and the apple watch.  He will look into these  Continue his diltiazem  Blood pressure well controlled  We spent more than 50% of our >25 min visit in face to face counseling regarding the above

## 2018-08-23 NOTE — Patient Instructions (Signed)
Medication Instructions:  - Your physician recommends that you continue on your current medications as directed. Please refer to the Current Medication list given to you today.  If you need a refill on your cardiac medications before your next appointment, please call your pharmacy.   Lab work: - none ordered  If you have labs (blood work) drawn today and your tests are completely normal, you will receive your results only by: [name] MyChart Message (if you have MyChart) OR . A paper copy in the mail If you have any lab test that is abnormal or we need to change your treatment, we will call you to review the results.  Testing/Procedures: - none ordered  Follow-Up: At [facility], you and your health needs are our priority.  As part of our continuing mission to provide you with exceptional heart care, we have created designated Provider Care Teams.  These Care Teams include your primary Cardiologist (physician) and Advanced Practice Providers (APPs -  Physician Assistants and Nurse Practitioners) who all work together to provide you with the care you need, when you need it. . You will need a follow up appointment in 6 months with Dr. Caryl Comes.  Please call our office 2 months in advance to schedule this appointment.   Any Other Special Instructions Will Be Listed Below (If Applicable). - Please obtain a Fit Bit/ Apple Watch to track your heart rate/ rhythm

## 2018-09-08 ENCOUNTER — Other Ambulatory Visit: Payer: Self-pay | Admitting: Internal Medicine

## 2018-09-18 ENCOUNTER — Other Ambulatory Visit: Payer: Self-pay | Admitting: Internal Medicine

## 2018-11-30 ENCOUNTER — Other Ambulatory Visit: Payer: Self-pay

## 2018-11-30 MED ORDER — LOSARTAN POTASSIUM-HCTZ 50-12.5 MG PO TABS: 1.0000 | ORAL_TABLET | Freq: Every day | ORAL | 1 refills | Status: DC

## 2019-02-12 ENCOUNTER — Other Ambulatory Visit: Payer: Self-pay

## 2019-02-12 MED ORDER — CITALOPRAM HYDROBROMIDE 20 MG PO TABS: ORAL_TABLET | ORAL | 1 refills | Status: DC

## 2019-03-02 ENCOUNTER — Other Ambulatory Visit: Payer: Self-pay | Admitting: Internal Medicine

## 2019-03-06 ENCOUNTER — Other Ambulatory Visit: Payer: Self-pay

## 2019-03-06 ENCOUNTER — Ambulatory Visit (INDEPENDENT_AMBULATORY_CARE_PROVIDER_SITE_OTHER): Payer: Medicare Other

## 2019-03-06 DIAGNOSIS — Z0000 Encounter for general adult medical examination without abnormal findings: Secondary | ICD-10-CM | POA: Diagnosis not present

## 2019-03-06 NOTE — Progress Notes (Addendum)
Subjective:   Austin Duran is a 73 y.o. male who presents for Medicare Annual/Subsequent preventive examination.  Review of Systems:  No ROS.  Medicare Wellness Virtual Visit.  Visual/audio telehealth visit, UTA vital signs.   See social history for additional risk factors.   Cardiac Risk Factors include: male gender;advanced age (>21men, >18 women);hypertension     Objective:    Vitals: There were no vitals taken for this visit.  There is no height or weight on file to calculate BMI.  Advanced Directives 03/06/2019 07/18/2017 07/15/2016 07/23/2015  Does Patient Have a Medical Advance Directive? No Yes Yes Yes  Type of Advance Directive - Living will;Healthcare Power of [place];Living will [place];Living will  Does patient want to make changes to medical advance directive? - No - Patient declined - No - Patient declined  Copy of [place] in Chart? - No - copy requested No - copy requested No - copy requested  Would patient like information on creating a medical advance directive? No - Patient declined - - -    Tobacco Social History   Tobacco Use  Smoking Status Former Smoker  . Quit date: 08/03/2004  . Years since quitting: 14.5  Smokeless Tobacco Never Used     Counseling given: Not Answered   Clinical Intake:  Pre-visit preparation completed: Yes        Diabetes: No  How often do you need to have someone help you when you read instructions, pamphlets, or other written materials from your doctor or pharmacy?: 1 - Never  Interpreter Needed?: No     Past Medical History:  Diagnosis Date  . Depression   . Hearing loss    had a perforated right TM,   followed by Dr. Tami Ribas  . Hypertension   . Panic attacks   . PVC's (premature ventricular contractions)    Past Surgical History:  Procedure Laterality Date  . CATARACT EXTRACTION Bilateral Aug 2013   Porfilio  . CATARACT EXTRACTION,  BILATERAL  May 2013   Porfilio  . EYE SURGERY  2008   eyelid surgery, Dr. Loletta Specter  . FRACTURE SURGERY    . [facility] , right    Family History  Problem Relation Age of Onset  . Cancer Maternal Aunt        lung  . Cancer Paternal Uncle        colon   Social History   Socioeconomic History  . Marital status: Married    Spouse name: Not on file  . Number of children: Not on file  . Years of education: Not on file  . Highest education level: Not on file  Occupational History    Comment: works 5 days a week  Social Needs  . Financial resource strain: Not hard at all  . Food insecurity    Worry: Never true    Inability: Never true  . Transportation needs    Medical: No    Non-medical: No  Tobacco Use  . Smoking status: Former Smoker    Quit date: 08/03/2004    Years since quitting: 14.5  . Smokeless tobacco: Never Used  Substance and Sexual Activity  . Alcohol use: Yes    Alcohol/week: 5.0 standard drinks    Types: 5 Standard drinks or equivalent per week  . Drug use: No  . Sexual activity: Yes  Lifestyle  . Physical activity    Days  per week: 5 days    Minutes per session: 30 min  . Stress: Not at all  Relationships  . Social Herbalist on phone: Patient refused    Gets together: Patient refused    Attends religious service: Patient refused    Active member of club or organization: Patient refused    Attends meetings of clubs or organizations: Patient refused    Relationship status: Patient refused  Other Topics Concern  . Not on file  Social History Narrative  . Not on file    Outpatient Encounter Medications as of 03/06/2019  Medication Sig  . aspirin 81 MG tablet Take 81 mg by mouth daily.    . Cholecalciferol (VITAMIN D-3 PO) Take 1 tablet by mouth daily.  . citalopram (CELEXA) 20 MG tablet TAKE 1 TABLET(20 MG) BY MOUTH DAILY  . diltiazem (CARDIZEM CD) 120 MG 24 hr capsule TAKE ONE CAPSULE BY MOUTH DAILY  .  losartan-hydrochlorothiazide (HYZAAR) 50-12.5 MG tablet Take 1 tablet by mouth daily.  . traZODone (DESYREL) 50 MG tablet Take 0.5-1 tablets (25-50 mg total) by mouth at bedtime as needed for sleep.   No facility-administered encounter medications on file as of 03/06/2019.     Activities of Daily Living In your present state of health, do you have any difficulty performing the following activities: 03/06/2019  Hearing? N  Vision? N  Difficulty concentrating or making decisions? N  Walking or climbing stairs? N  Dressing or bathing? N  Doing errands, shopping? N  Preparing Food and eating ? N  Using the Toilet? N  In the past six months, have you accidently leaked urine? N  Do you have problems with loss of bowel control? N  Managing your Medications? N  Managing your Finances? N  Housekeeping or managing your Housekeeping? N  Some recent data might be hidden    Patient Care Team: [name], MD as PCP - General (Internal Medicine)   Assessment:   This is a routine wellness examination for [place].  I connected with patient 03/06/19 at 11:30 AM EDT by an audio enabled telemedicine application and verified that I am speaking with the correct person using two identifiers. Patient stated full name and DOB. Patient gave permission to continue with virtual visit. Patient's location was at home and Nurse's location was at [place] office.   Health Screenings  Colonoscopy - 07/2011 Glaucoma -none Hearing -demonstrates normal hearing during visit. Hemoglobin A1C - 08/2017 TSH- 06/2018 Cholesterol - 06/2018 Dental- UTD Vision- visits within the last 12 months.  Social  Alcohol intake - yes      Smoking history- former    Smokers in home? none Illicit drug use? none Physical activity- walks 5 days weekly, 3-4 miles  Diet - regular Sexually Active -yes BMI- discussed the importance of a healthy diet, water intake and the benefits of aerobic exercise.  Educational material  provided.   Safety  Patient feels safe at home- yes Patient does have smoke detectors at home- yes Patient does wear sunscreen or protective clothing when in direct sunlight -yes Patient does wear seat belt when in a moving vehicle -yes Patient drives- yes  [id number] precautions and sickness symptoms discussed.   Activities of Daily Living Patient denies needing assistance with: driving, household chores, feeding themselves, getting from bed to chair, getting to the toilet, bathing/showering, dressing, managing money, or preparing meals.  No new identified risk were noted.    Depression Screen Patient denies losing interest in  daily life, feeling hopeless, or crying easily over simple problems.   Medication-taking as directed and without issues.   Fall Screen Patient denies being afraid of falling or falling in the last year.   Memory Screen Patient is alert.  Patient denies difficulty focusing, concentrating or misplacing items. Correctly identified the president of the [place], season and recall. 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   Immunizations The following Immunizations were discussed: Influenza, shingles, pneumonia, and tetanus.   Other Providers Patient Care Team: [name], MD as PCP - General (Internal Medicine)  Exercise Activities and Dietary recommendations Current Exercise Habits: Home exercise routine, Type of exercise: walking, Time (Minutes): 30, Frequency (Times/Week): 5, Weekly Exercise (Minutes/Week): 150, Intensity: Mild  Goals      Patient Stated   . DIET - REDUCE PORTION SIZE (pt-stated)     Decrease the amount of salad dressing with salads Portion controlled portion sizes with meals       Fall Risk Fall Risk  03/06/2019 07/18/2017 08/08/2016 07/15/2016 07/23/2015  Falls in the past year? 0 No No No No   Depression Screen PHQ 2/9 Scores 03/06/2019 07/03/2018 07/18/2017 08/08/2016  PHQ - 2  Score 0 0 0 0  PHQ- 9 Score - 1 0 -    Cognitive Function MMSE - Mini Mental State Exam 07/15/2016 07/23/2015  Orientation to time 5 5  Orientation to Place 5 5  Registration 3 3  Attention/ Calculation 5 5  Recall 3 3  Language- name 2 objects 2 2  Language- repeat 1 1  Language- follow 3 step command 3 3  Language- read & follow direction 1 1  Write a sentence 1 1  Copy design 1 1  Total score 30 30     6CIT Screen 03/06/2019 07/18/2017 07/15/2016  What Year? 0 points 0 points 0 points  What month? 0 points 0 points 0 points  What time? 0 points 0 points 0 points  Count back from 20 0 points 0 points 0 points  Months in reverse 0 points 0 points 0 points  Repeat phrase 0 points 0 points -  Total Score 0 0 -    Immunization History  Administered Date(s) Administered  . Influenza Split 08/04/2011, 07/04/2012, 06/15/2015  . Influenza, High Dose Seasonal PF 07/02/2013, 07/18/2017  . Influenza,inj,Quad PF,6+ Mos 07/10/2014, 04/26/2016  . Influenza,inj,quad, With Preservative 06/13/2018  . Pneumococcal Conjugate-13 07/02/2013  . Pneumococcal Polysaccharide-23 07/10/2014, 08/08/2016  . Pneumococcal-Unspecified 07/10/2009  . Tdap 08/04/2011, 02/15/2016  . Zoster 07/26/2012   Screening Tests Health Maintenance  Topic Date Due  . INFLUENZA VACCINE  04/06/2019  . COLONOSCOPY  08/03/2021  . TETANUS/TDAP  02/14/2026  . Hepatitis C Screening  Completed  . PNA vac Low Risk Adult  Completed       Plan:   End of life planning; Advanced aging; Advanced directives discussed.  No HCPOA/Living Will.  Additional information declined at this time.  I have personally reviewed and noted the following in the patient's chart:   . Medical and social history . Use of alcohol, tobacco or illicit drugs  . Current medications and supplements . Functional ability and status . Nutritional status . Physical activity . Advanced directives . List of other physicians . Hospitalizations,  surgeries, and ER visits in previous 12 months . Vitals . Screenings to include cognitive, depression, and falls . Referrals and appointments  In addition, I have reviewed and discussed with patient certain  preventive protocols, quality metrics, and best practice recommendations. A written personalized care plan for preventive services as well as general preventive health recommendations were provided to patient.     OBrien-Blaney, Lashundra Shiveley L, LPN  01/12/7415    I have reviewed the above information and agree with above.   Deborra Medina, MD

## 2019-03-06 NOTE — Patient Instructions (Addendum)
  Mr. Austin Duran , Thank you for taking time to come for your Medicare Wellness Visit. I appreciate your ongoing commitment to your health goals. Please review the following plan we discussed and let me know if I can assist you in the future.   These are the goals we discussed: Goals      Patient Stated   . DIET - REDUCE PORTION SIZE (pt-stated)     Decrease the amount of salad dressing with salads Portion controlled portion sizes with meals       This is a list of the screening recommended for you and due dates:  Health Maintenance  Topic Date Due  . Flu Shot  04/06/2019  . Colon Cancer Screening  08/03/2021  . Tetanus Vaccine  02/14/2026  .  Hepatitis C: One time screening is recommended by Center for Disease Control  (CDC) for  adults born from [birth year] through 1965.   Completed  . Pneumonia vaccines  Completed

## 2019-03-07 ENCOUNTER — Other Ambulatory Visit: Payer: Self-pay

## 2019-03-25 ENCOUNTER — Telehealth: Payer: Self-pay | Admitting: Internal Medicine

## 2019-03-25 NOTE — Telephone Encounter (Signed)

## 2019-03-26 ENCOUNTER — Other Ambulatory Visit: Payer: Self-pay

## 2019-03-26 ENCOUNTER — Ambulatory Visit (INDEPENDENT_AMBULATORY_CARE_PROVIDER_SITE_OTHER): Payer: Medicare Other | Admitting: Internal Medicine

## 2019-03-26 ENCOUNTER — Encounter: Payer: Self-pay | Admitting: Internal Medicine

## 2019-03-26 VITALS — BP 136/60 | HR 81 | Ht 68.0 in | Wt 216.1 lb

## 2019-03-26 DIAGNOSIS — I493 Ventricular premature depolarization: Secondary | ICD-10-CM

## 2019-03-26 DIAGNOSIS — I4891 Unspecified atrial fibrillation: Secondary | ICD-10-CM

## 2019-03-26 NOTE — Progress Notes (Signed)
      Patient Care Team: [name], MD as PCP - General (Internal Medicine)   HPI  Austin Duran is a 73 y.o. male Seen in follow-up for "PVCs" and atrial fib detected on recent event recorder    No interval arrhythmia  The patient denies chest pain, nocturnal dyspnea, orthopnea or peripheral edema.  There have been no palpitations, lightheadedness or syncope  Has some dyspnea -- pushing a tractor on to a truck bed  .sleep disordered breathing and evening somnolence  Has dx sleep apnea, but not interested in treatment.     DATE TEST EF   7/18 Echo   55-65   %         Date Cr K  12//18 0.98 4.1  10/19  1.17 4.2    Thromboembolic risk factors ( age -10, HTN-1) for a CHADSVASc Score of 2   Records and Results Reviewed   Past Medical History:  Diagnosis Date  . Depression   . Hearing loss    had a perforated right TM,   followed by Dr. Tami Ribas  . Hypertension   . Panic attacks   . PVC's (premature ventricular contractions)     Past Surgical History:  Procedure Laterality Date  . CATARACT EXTRACTION Bilateral Aug 2013   Porfilio  . CATARACT EXTRACTION, BILATERAL  May 2013   Porfilio  . EYE SURGERY  2008   eyelid surgery, Dr. Loletta Specter  . FRACTURE SURGERY    . [facility] , right     Current Meds  Medication Sig  . aspirin 81 MG tablet Take 81 mg by mouth daily.    . Cholecalciferol (VITAMIN D-3 PO) Take 1 tablet by mouth daily.  . citalopram (CELEXA) 20 MG tablet TAKE 1 TABLET(20 MG) BY MOUTH DAILY  . diltiazem (CARDIZEM CD) 120 MG 24 hr capsule TAKE ONE CAPSULE BY MOUTH DAILY  . losartan-hydrochlorothiazide (HYZAAR) 50-12.5 MG tablet Take 1 tablet by mouth daily.  . traZODone (DESYREL) 50 MG tablet Take 0.5-1 tablets (25-50 mg total) by mouth at bedtime as needed for sleep.    Allergies  Allergen Reactions  . Augmentin [Amoxicillin-Pot Clavulanate] Nausea Only  . Lisinopril Cough      Review of Systems negative  except from HPI and PMH  Physical Exam BP 136/60 (BP Location: Left Arm, Patient Position: Sitting, Cuff Size: Large)   Pulse 81   Ht 5\' 8"  (1.727 m)   Wt 216 lb 1.9 oz (98 kg)   SpO2 98%   BMI 32.86 kg/m  Well developed and nourished in no acute distress HENT normal Neck supple with JVP-  flat   Clear Regular rate and rhythm, no murmurs or gallops Abd-soft with active BS No Clubbing cyanosis edema Skin-warm and dry A & Oriented  Grossly normal sensory and motor function  ECG sinus @ 76 14/08/40    Assessment and  Plan  PVCs-RVOT-exercise associated  Hypertension with hypertensive heart disease  Obesity-BMI greater than 30  Atrial Fib SCAF   BP well controlled  Sleep disordered breathing with dx of sleep apnea- not interested but discussed breath right strips

## 2019-03-26 NOTE — Patient Instructions (Signed)
Medication Instructions:  - Your physician recommends that you continue on your current medications as directed. Please refer to the Current Medication list given to you today.  If you need a refill on your cardiac medications before your next appointment, please call your pharmacy.   Lab work: - none ordered  If you have labs (blood work) drawn today and your tests are completely normal, you will receive your results only by: [name] MyChart Message (if you have MyChart) OR . A paper copy in the mail If you have any lab test that is abnormal or we need to change your treatment, we will call you to review the results.  Testing/Procedures: - none ordered  Follow-Up: At [facility], you and your health needs are our priority.  As part of our continuing mission to provide you with exceptional heart care, we have created designated Provider Care Teams.  These Care Teams include your primary Cardiologist (physician) and Advanced Practice Providers (APPs -  Physician Assistants and Nurse Practitioners) who all work together to provide you with the care you need, when you need it.  . You will need a follow up appointment in 1 year with Dr. Caryl Comes (July 2021).  . Please call our office 2 months in advance to schedule this appointment.  (Call in early May 2021 to schedule)  Any Other Special Instructions Will Be Listed Below (If Applicable). - N/A

## 2019-04-25 DIAGNOSIS — H43813 Vitreous degeneration, bilateral: Secondary | ICD-10-CM | POA: Diagnosis not present

## 2019-05-11 DIAGNOSIS — Z23 Encounter for immunization: Secondary | ICD-10-CM | POA: Diagnosis not present

## 2019-05-30 ENCOUNTER — Other Ambulatory Visit: Payer: Self-pay | Admitting: Internal Medicine

## 2019-05-30 NOTE — Telephone Encounter (Signed)
This is a [place] pt 

## 2019-07-05 DIAGNOSIS — L03031 Cellulitis of right toe: Secondary | ICD-10-CM | POA: Diagnosis not present

## 2019-07-18 DIAGNOSIS — L03031 Cellulitis of right toe: Secondary | ICD-10-CM | POA: Diagnosis not present

## 2019-08-12 ENCOUNTER — Other Ambulatory Visit: Payer: Self-pay

## 2019-08-12 MED ORDER — CITALOPRAM HYDROBROMIDE 20 MG PO TABS: ORAL_TABLET | ORAL | 0 refills | Status: DC

## 2019-08-28 ENCOUNTER — Other Ambulatory Visit: Payer: Self-pay | Admitting: Internal Medicine

## 2019-09-16 ENCOUNTER — Other Ambulatory Visit: Payer: Self-pay

## 2019-09-16 MED ORDER — CITALOPRAM HYDROBROMIDE 20 MG PO TABS: ORAL_TABLET | ORAL | 0 refills | Status: DC

## 2019-09-19 ENCOUNTER — Other Ambulatory Visit: Payer: Self-pay

## 2019-09-19 ENCOUNTER — Encounter: Payer: Self-pay | Admitting: Internal Medicine

## 2019-09-19 ENCOUNTER — Ambulatory Visit (INDEPENDENT_AMBULATORY_CARE_PROVIDER_SITE_OTHER): Payer: Medicare Other | Admitting: Internal Medicine

## 2019-09-19 VITALS — Ht 68.0 in | Wt 212.0 lb

## 2019-09-19 DIAGNOSIS — Z125 Encounter for screening for malignant neoplasm of prostate: Secondary | ICD-10-CM

## 2019-09-19 DIAGNOSIS — G5693 Unspecified mononeuropathy of bilateral upper limbs: Secondary | ICD-10-CM | POA: Diagnosis not present

## 2019-09-19 DIAGNOSIS — Z1211 Encounter for screening for malignant neoplasm of colon: Secondary | ICD-10-CM | POA: Diagnosis not present

## 2019-09-19 DIAGNOSIS — E785 Hyperlipidemia, unspecified: Secondary | ICD-10-CM

## 2019-09-19 DIAGNOSIS — E538 Deficiency of other specified B group vitamins: Secondary | ICD-10-CM | POA: Diagnosis not present

## 2019-09-19 DIAGNOSIS — R7301 Impaired fasting glucose: Secondary | ICD-10-CM

## 2019-09-19 DIAGNOSIS — I10 Essential (primary) hypertension: Secondary | ICD-10-CM | POA: Diagnosis not present

## 2019-09-19 DIAGNOSIS — E669 Obesity, unspecified: Secondary | ICD-10-CM | POA: Diagnosis not present

## 2019-09-19 DIAGNOSIS — Z0000 Encounter for general adult medical examination without abnormal findings: Secondary | ICD-10-CM

## 2019-09-19 DIAGNOSIS — I471 Supraventricular tachycardia: Secondary | ICD-10-CM | POA: Diagnosis not present

## 2019-09-19 MED ORDER — CITALOPRAM HYDROBROMIDE 20 MG PO TABS: ORAL_TABLET | ORAL | 1 refills | Status: DC

## 2019-09-19 MED ORDER — DILTIAZEM HCL ER COATED BEADS 120 MG PO CP24: 120.0000 mg | ORAL_CAPSULE | Freq: Every day | ORAL | 2 refills | Status: DC

## 2019-09-19 MED ORDER — LOSARTAN POTASSIUM-HCTZ 50-12.5 MG PO TABS: 1.0000 | ORAL_TABLET | Freq: Every day | ORAL | 0 refills | Status: DC

## 2019-09-19 NOTE — Assessment & Plan Note (Signed)
Last year's risk assessment yielded a 10 yr risk of 11% , with no history of DM or CAD.  Screening for prediabetes with next lab  Lab Results  Component Value Date   CHOL 111 07/03/2018   HDL 40.70 07/03/2018   LDLCALC 50 07/03/2018   TRIG 100.0 07/03/2018   CHOLHDL 3 07/03/2018   Lab Results  Component Value Date   HGBA1C 5.8 07/03/2018

## 2019-09-19 NOTE — Assessment & Plan Note (Signed)
Has been taking 50 mg losartan/hctz since diltiazem  was prescribed .

## 2019-09-19 NOTE — Progress Notes (Signed)
Telephone  Note  This visit type was conducted due to national recommendations for restrictions regarding the COVID-19 pandemic (e.g. social distancing).  This format is felt to be most appropriate for this patient at this time.  All issues noted in this document were discussed and addressed.  No physical exam was performed (except for noted visual exam findings with Video Visits).   I connected with@ on 09/19/19 at 11:30 AM EST by telephone and verified that I am speaking with the correct person using two identifiers. Location patient: home Location provider: work or home office Persons participating in the virtual visit: patient, provider  I discussed the limitations, risks, security and privacy concerns of performing an evaluation and management service by telephone and the availability of in person appointments. I also discussed with the patient that there may be a patient responsible charge related to this service. The patient expressed understanding and agreed to proceed.   Reason for visit: follow up on hypertension, PAF, GAD/depression and hypertension   HPI:  74 yr old male with hypertension,  PAF/SVT ,  Untreated OSA  Presents for follow up .  Last seen one year ago  Feels generally well.  Infrequent runs of SVT/Atrial fib lasting 10-15 minutes,  No longer associated with panicky feelings,  Dyspnea or chest tightness  Hypertension: patient checks blood pressure once weekly at home.  Readings have been for the most part < 140/80 at rest . Patient is following a reduce salt diet most days and is taking medications as prescribed  Obesity:  Weight still  210 to 216.  Not exercising.  Works full time , mostly outside ,  Some physical labor,    Has been having sciatica on right side for a few weeks.  Numbness in right foot.[name]  going to see a chiropractor   No prior colonoscopy.  Does not see a urologist.  Last psa 2018  ROS: See pertinent positives and negatives per HPI.  Past  Medical History:  Diagnosis Date  . Depression   . Hearing loss    had a perforated right TM,   followed by Dr. Tami Ribas  . Hypertension   . Panic attacks   . PVC's (premature ventricular contractions)     Past Surgical History:  Procedure Laterality Date  . CATARACT EXTRACTION Bilateral Aug 2013   Porfilio  . CATARACT EXTRACTION, BILATERAL  May 2013   Porfilio  . EYE SURGERY  2008   eyelid surgery, Dr. Loletta Specter  . FRACTURE SURGERY    . [facility] , right     Family History  Problem Relation Age of Onset  . Cancer Maternal Aunt        lung  . Cancer Paternal Uncle        colon    SOCIAL HX:  reports that he quit smoking about 15 years ago. He has never used smokeless tobacco. He reports current alcohol use of about 5.0 standard drinks of alcohol per week. He reports that he does not use drugs.  Current Outpatient Medications:  .  aspirin 81 MG tablet, Take 81 mg by mouth daily.  , Disp: , Rfl:  .  Cholecalciferol (VITAMIN D-3 PO), Take 1 tablet by mouth daily., Disp: , Rfl:  .  citalopram (CELEXA) 20 MG tablet, TAKE 1 TABLET(20 MG) BY MOUTH DAILY, Disp: 90 tablet, Rfl: 1 .  diltiazem (CARDIZEM CD) 120 MG 24 hr capsule, Take 1 capsule (120 mg total) by mouth  daily., Disp: 90 capsule, Rfl: 2 .  losartan-hydrochlorothiazide (HYZAAR) 50-12.5 MG tablet, Take 1 tablet by mouth daily., Disp: 90 tablet, Rfl: 0  EXAM:  VITALS per patient if applicable:  GENERAL: alert, oriented, appears well and in no acute distress  HEENT: atraumatic, conjunttiva clear, no obvious abnormalities on inspection of external nose and ears  NECK: normal movements of the head and neck  LUNGS: on inspection no signs of respiratory distress, breathing rate appears normal, no obvious gross SOB, gasping or wheezing  CV: no obvious cyanosis  MS: moves all visible extremities without noticeable abnormality  PSYCH/NEURO: pleasant and cooperative, no obvious depression or  anxiety, speech and thought processing grossly intact  ASSESSMENT AND PLAN:  Discussed the following assessment and plan:  Dyslipidemia with elevated low density lipoprotein cholesterol and abnormally low high density lipoprotein cholesterol - Plan: Lipid panel  Obesity (BMI 30-39.9)  SVT (supraventricular tachycardia) (HCC) - Plan: TSH, CBC with Differential/Platelet  Essential hypertension - Plan: Comprehensive metabolic panel  Prostate cancer screening - Plan: PSA, Medicare  Impaired fasting glucose - Plan: Hemoglobin A1c  Colon cancer screening - Plan: Cologuard  B12 deficiency - Plan: Vitamin B12  Bilateral neuropathy of upper extremities  Encounter for preventive health examination  Bilateral neuropathy of upper extremities Secondary to cervical spine degenerative changes causing encroachment on neural foramina bilaterally at mulitple levels by Feb 2018 CT.  B12 deficiency Recurrent, has not had injections in several moths.. repeat level needed  Lab Results  Component Value Date   VITAMINB12 280 08/09/2017      Dyslipidemia with elevated low density lipoprotein cholesterol and abnormally low high density lipoprotein cholesterol Last year's risk assessment yielded a 10 yr risk of 11% , with no history of DM or CAD.  Screening for prediabetes with next lab  Lab Results  Component Value Date   CHOL 111 07/03/2018   HDL 40.70 07/03/2018   LDLCALC 50 07/03/2018   TRIG 100.0 07/03/2018   CHOLHDL 3 07/03/2018   Lab Results  Component Value Date   HGBA1C 5.8 07/03/2018     Encounter for preventive health examination age appropriate education and counseling updated, referrals for preventative services and immunizations addressed, dietary and smoking counseling addressed, most recent labs reviewed.  I have personally reviewed and have noted:  1) the patient's medical and social history 2) The pt's use of alcohol, tobacco, and illicit drugs 3) The patient's  current medications and supplements 4) Functional ability including ADL's, fall risk, home safety risk, hearing and visual impairment 5) Diet and physical activities 6) Evidence for depression or mood disorder 7) The patient's height, weight, and BMI have been recorded in the chart  I have made referrals, and provided counseling and education based on review of the above  Hypertension Has been taking 50 mg losartan/hctz since diltiazem  was prescribed .     I discussed the assessment and treatment plan with the patient. The patient was provided an opportunity to ask questions and all were answered. The patient agreed with the plan and demonstrated an understanding of the instructions.   The patient was advised to call back or seek an in-person evaluation if the symptoms worsen or if the condition fails to improve as anticipated.  I provided  25 minutes of non-face-to-face time during this encounter reviewing patient's current problems and past procedures/imaging studies, providing counseling on the above mentioned problems , and coordination  of care .  [name], MD

## 2019-09-19 NOTE — Assessment & Plan Note (Signed)

## 2019-09-19 NOTE — Assessment & Plan Note (Signed)
Recurrent, has not had injections in several moths.. repeat level needed  Lab Results  Component Value Date   VITAMINB12 280 08/09/2017

## 2019-09-19 NOTE — Assessment & Plan Note (Signed)
Secondary to cervical spine degenerative changes causing encroachment on neural foramina bilaterally at mulitple levels by Feb 2018 CT.

## 2019-09-23 ENCOUNTER — Other Ambulatory Visit: Payer: Medicare Other

## 2019-09-24 ENCOUNTER — Telehealth: Payer: Self-pay | Admitting: Internal Medicine

## 2019-09-24 NOTE — Telephone Encounter (Signed)
Spoke with pt and informed him of the vitamins that he needs to get from over the counter and let him know that he would need to have a virtual visit with Dr. Derrel Nip to see if he would need a prednisone taper. Pt is scheduled for Thursday at 12:30pm.

## 2019-09-24 NOTE — Telephone Encounter (Signed)
Vitamins are available otc  vitami d 2000 Ius daily  zinc 50 mg twice daily  For prednisone I would need to talk to him ,  You can add him Thursday

## 2019-09-24 NOTE — Telephone Encounter (Signed)
Pt called wanting to know if the doctor can prescribe vitamins or steroid? Pt tested + for covid. Please advise and Thank you!  Call pt @ [phone].

## 2019-09-26 ENCOUNTER — Other Ambulatory Visit: Payer: Self-pay

## 2019-09-26 ENCOUNTER — Encounter: Payer: Self-pay | Admitting: Internal Medicine

## 2019-09-26 ENCOUNTER — Ambulatory Visit (INDEPENDENT_AMBULATORY_CARE_PROVIDER_SITE_OTHER): Payer: Medicare Other | Admitting: Internal Medicine

## 2019-09-26 DIAGNOSIS — U071 COVID-19: Secondary | ICD-10-CM

## 2019-09-26 NOTE — Patient Instructions (Signed)
Your COVID-19 infection appears to be very  mild.  You are fortunate!  remember that you are contagious for 14 days. .    You should continue your self imposed quantarine until January 29  As long as  you can answer "yes" to ALL 3 of the conditions below:   1) Your symptoms (fever, cough, shortness of breath) started 7 or more days ago   2) Your body temperature  has been normal for at least 72 hours (WITHOUT the use of any tylenol, motrin or aleve) . Normal is < 100.4 Farenheit  3) your other flu  like symptoms symptoms are getting better.   YOUR FAMILY or other household contacts, however , even if they feel fine , will need to continue to quarantining themselves  (that means no contact with ANYONE outside of the house)  for 14 days STARTING from the end of your initial 7 day period . (why? because they have been theoretically exposed to you during the entire 7 days of your illness, and they can sheD the virus without symptoms for that period of time ).    Regards,   Deborra Medina, MD

## 2019-09-26 NOTE — Assessment & Plan Note (Signed)
Symptoms began on Jan 15,  Test was done on Jan 16.  Symptoms are very mild and include only a mild cough and sinus congestion.  Advised to rest,  Quarantine for 14 days from Jan 15 (until Jan 29)  Use cough suppressant,  Call if symptoms worsen

## 2019-09-26 NOTE — Addendum Note (Signed)
Addended by: Leeanne Rio on: 09/26/2019 04:57 PM   Modules accepted: Orders

## 2019-09-26 NOTE — Progress Notes (Signed)
Virtual Visit via [email or web address]  This visit type was conducted due to national recommendations for restrictions regarding the COVID-19 pandemic (e.g. social distancing).  This format is felt to be most appropriate for this patient at this time.  All issues noted in this document were discussed and addressed.  No physical exam was performed (except for noted visual exam findings with Video Visits).   I connected with@ on 09/26/19 at 12:30 PM EST by a video enabled telemedicine application and verified that I am speaking with the correct person using two identifiers. Location patient: home Location provider: work or home office Persons participating in the virtual visit: patient, provider  I discussed the limitations, risks, security and privacy concerns of performing an evaluation and management service by telephone and the availability of in person appointments. I also discussed with the patient that there may be a patient responsible charge related to this service. The patient expressed understanding and agreed to proceed.   Reason for visit: Positive COVID TEST ON Saturday JAN 16TH   HPI:  74 YR OLD MALE WITH HISTORY OF TOBACCO ABUSE,  Chronic bronchitic  changes on 2018 chest films presents with a  positive COVID 19 test on jan 16 after developing a mild cough on Jan 15,  Accompanied by mild body aches and a brief , self limited headache.  Wife was also couughing as of Jan 15 and was tested yesterday .  Denies fevers,  Dyspnea,  pleurisy and wheezing.  Using benzonatate for cough prn,  And coricidin for nasal congestion.    ROS: Patient denies headache, fevers, malaise, unintentional weight loss, skin rash, eye pain, sinus congestion and sinus pain, sore throat, dysphagia,  hemoptysis , cough, dyspnea, wheezing, chest pain, palpitations, orthopnea, edema, abdominal pain, nausea, melena, diarrhea, constipation, flank pain, dysuria, hematuria, urinary  Frequency, nocturia, numbness, tingling, seizures,   Focal weakness, Loss of consciousness,  Tremor, insomnia, depression, anxiety, and suicidal ideation.      Past Medical History:  Diagnosis Date  . Depression   . Hearing loss    had a perforated right TM,   followed by Dr. Tami Ribas  . Hypertension   . Panic attacks   . PVC's (premature ventricular contractions)     Past Surgical History:  Procedure Laterality Date  . CATARACT EXTRACTION Bilateral Aug 2013   [facility]  . CATARACT EXTRACTION, BILATERAL  May 2013   [facility]fil[facility]  . EYE SURGERY  2008   eyelid surgery, Dr. Loletta Specter  . FRACTURE SURGERY    . [facility] , right     Family History  Problem Relation Age of Onset  . Cancer Maternal Aunt        lung  . Cancer Paternal Uncle        colon    SOCIAL HX:  reports that he quit smoking about 15 years ago. He has never used smokeless tobacco. He reports current alcohol use of about 5.0 standard drinks of alcohol per week. He reports that he does not use drugs.   Current Outpatient Medications:  .  aspirin 81 MG tablet, Take 81 mg by mouth daily.  , Disp: , Rfl:  .  Cholecalciferol (VITAMIN D-3 PO), Take 1 tablet by mouth daily., Disp: , Rfl:  .  citalopram (CELEXA) 20 MG tablet, TAKE 1 TABLET(20 MG) BY MOUTH DAILY, Disp: 90 tablet, Rfl: 1 .  diltiazem (CARDIZEM CD) 120 MG 24 hr capsule, Take 1 capsule (120 mg total) by mouth  daily., Disp: 90 capsule, Rfl: 2 .  losartan-hydrochlorothiazide (HYZAAR) 50-12.5 MG tablet, Take 1 tablet by mouth daily., Disp: 90 tablet, Rfl: 0  EXAM:  VITALS per patient if applicable:  GENERAL: alert, oriented, appears well and in no acute distress  HEENT: atraumatic, conjunttiva clear, no obvious abnormalities on inspection of external nose and ears  NECK: normal movements of the head and neck  LUNGS: on inspection no signs of respiratory distress, breathing rate appears normal, no obvious gross SOB, gasping or wheezing  CV: no obvious cyanosis  MS: moves all  visible extremities without noticeable abnormality  PSYCH/NEURO: pleasant and cooperative, no obvious depression or anxiety, speech and thought processing grossly intact  ASSESSMENT AND PLAN:  Discussed the following assessment and plan:  COVID-19 virus infection  COVID-19 virus infection Symptoms began on Jan 15,  Test was done on Jan 16.  Symptoms are very mild and include only a mild cough and sinus congestion.  Advised to rest,  Quarantine for 14 days from Jan 15 (until Jan 29)  Use cough suppressant,  Call if symptoms worsen     I discussed the assessment and treatment plan with the patient. The patient was provided an opportunity to ask questions and all were answered. The patient agreed with the plan and demonstrated an understanding of the instructions.   The patient was advised to call back or seek an in-person evaluation if the symptoms worsen or if the condition fails to improve as anticipated.  [name], MD

## 2019-10-01 DIAGNOSIS — Z1211 Encounter for screening for malignant neoplasm of colon: Secondary | ICD-10-CM | POA: Diagnosis not present

## 2019-10-05 LAB — COLOGUARD
COLOGUARD: NEGATIVE
Cologuard: NEGATIVE

## 2019-10-08 LAB — COLOGUARD: Cologuard: NEGATIVE

## 2019-10-13 ENCOUNTER — Telehealth: Payer: Self-pay | Admitting: Internal Medicine

## 2019-10-13 NOTE — Telephone Encounter (Signed)
The results of your cologuard test were negative.   We will repeat this every 3 years  Until you are 85 for colon CA screening.  

## 2019-10-14 ENCOUNTER — Other Ambulatory Visit: Payer: Medicare Other

## 2019-10-14 NOTE — Telephone Encounter (Signed)
Spoke with pt's wife(on DPR) and informed her of the pt's results. Wife stated that she would let pt know when he got home from work.

## 2019-10-21 ENCOUNTER — Other Ambulatory Visit (INDEPENDENT_AMBULATORY_CARE_PROVIDER_SITE_OTHER): Payer: Medicare Other

## 2019-10-21 ENCOUNTER — Other Ambulatory Visit: Payer: Self-pay

## 2019-10-21 DIAGNOSIS — Z125 Encounter for screening for malignant neoplasm of prostate: Secondary | ICD-10-CM | POA: Diagnosis not present

## 2019-10-21 DIAGNOSIS — E538 Deficiency of other specified B group vitamins: Secondary | ICD-10-CM

## 2019-10-21 DIAGNOSIS — E785 Hyperlipidemia, unspecified: Secondary | ICD-10-CM

## 2019-10-21 DIAGNOSIS — I471 Supraventricular tachycardia, unspecified: Secondary | ICD-10-CM

## 2019-10-21 DIAGNOSIS — I10 Essential (primary) hypertension: Secondary | ICD-10-CM | POA: Diagnosis not present

## 2019-10-21 DIAGNOSIS — R7301 Impaired fasting glucose: Secondary | ICD-10-CM

## 2019-10-21 LAB — CBC WITH DIFFERENTIAL/PLATELET
Basophils Absolute: 0.1 10*3/uL (ref 0.0–0.1)
Basophils Relative: 1.1 % (ref 0.0–3.0)
Eosinophils Absolute: 0.1 10*3/uL (ref 0.0–0.7)
Eosinophils Relative: 1.6 % (ref 0.0–5.0)
HCT: 42.9 % (ref 39.0–52.0)
Hemoglobin: 14.7 g/dL (ref 13.0–17.0)
Lymphocytes Relative: 19.8 % (ref 12.0–46.0)
Lymphs Abs: 1.4 10*3/uL (ref 0.7–4.0)
MCHC: 34.2 g/dL (ref 30.0–36.0)
MCV: 90.9 fl (ref 78.0–100.0)
Monocytes Absolute: 1.1 10*3/uL — ABNORMAL HIGH (ref 0.1–1.0)
Monocytes Relative: 15.5 % — ABNORMAL HIGH (ref 3.0–12.0)
Neutro Abs: 4.3 10*3/uL (ref 1.4–7.7)
Neutrophils Relative %: 62 % (ref 43.0–77.0)
Platelets: 256 10*3/uL (ref 150.0–400.0)
RBC: 4.73 Mil/uL (ref 4.22–5.81)
RDW: 12.8 % (ref 11.5–15.5)
WBC: 7 10*3/uL (ref 4.0–10.5)

## 2019-10-21 LAB — LIPID PANEL
Cholesterol: 139 mg/dL (ref 0–200)
HDL: 33.3 mg/dL — ABNORMAL LOW (ref >39.00)
LDL Cholesterol: 84 mg/dL (ref 0–99)
NonHDL: 106.15
Total CHOL/HDL Ratio: 4
Triglycerides: 113 mg/dL (ref 0.0–149.0)
VLDL: 22.6 mg/dL (ref 0.0–40.0)

## 2019-10-21 LAB — PSA, MEDICARE: PSA: 1.24 ng/ml (ref 0.10–4.00)

## 2019-10-21 LAB — COMPREHENSIVE METABOLIC PANEL
ALT: 18 U/L (ref 0–53)
AST: 14 U/L (ref 0–37)
Albumin: 4.4 g/dL (ref 3.5–5.2)
Alkaline Phosphatase: 69 U/L (ref 39–117)
BUN: 19 mg/dL (ref 6–23)
CO2: 28 mEq/L (ref 19–32)
Calcium: 9.8 mg/dL (ref 8.4–10.5)
Chloride: 102 mEq/L (ref 96–112)
Creatinine, Ser: 1.1 mg/dL (ref 0.40–1.50)
GFR: 65.58 mL/min (ref >60.00)
Glucose, Bld: 123 mg/dL — ABNORMAL HIGH (ref 70–99)
Potassium: 3.9 mEq/L (ref 3.5–5.1)
Sodium: 136 mEq/L (ref 135–145)
Total Bilirubin: 0.8 mg/dL (ref 0.2–1.2)
Total Protein: 6.6 g/dL (ref 6.0–8.3)

## 2019-10-21 LAB — HEMOGLOBIN A1C: Hgb A1c MFr Bld: 5.9 % (ref 4.6–6.5)

## 2019-10-21 LAB — VITAMIN B12: Vitamin B-12: 199 pg/mL — ABNORMAL LOW (ref 211–911)

## 2019-10-21 LAB — TSH: TSH: 1.93 u[IU]/mL (ref 0.35–4.50)

## 2019-10-24 NOTE — Addendum Note (Signed)
Addended by: [name] on: 10/24/2019 12:13 PM   Modules accepted: Orders

## 2019-10-24 NOTE — Progress Notes (Signed)
His  B12 is low and  needs supplementation with  b12 injections weekly for 3 weeks, and during the first visit I will need to check   Untreated B12 deficiency can lead to irreversible dementia and neuropathy so it is very important to treat .  He is also on the verge of being diagnosed with type 2 Diabetes (fasting glucose was 123, it's diagnosed at 125!) schedule a follow up visit in 3 months and start exercising and following a low glycemic index diet.   Everything else was normal

## 2019-10-25 ENCOUNTER — Telehealth: Payer: Self-pay | Admitting: Internal Medicine

## 2019-10-25 NOTE — Telephone Encounter (Signed)
Pt called returning a call about lab results  

## 2019-10-25 NOTE — Telephone Encounter (Signed)
See result note message 

## 2019-11-07 ENCOUNTER — Other Ambulatory Visit: Payer: Self-pay

## 2019-11-07 ENCOUNTER — Other Ambulatory Visit (INDEPENDENT_AMBULATORY_CARE_PROVIDER_SITE_OTHER): Payer: Medicare Other

## 2019-11-07 ENCOUNTER — Ambulatory Visit (INDEPENDENT_AMBULATORY_CARE_PROVIDER_SITE_OTHER): Payer: Medicare Other

## 2019-11-07 DIAGNOSIS — E538 Deficiency of other specified B group vitamins: Secondary | ICD-10-CM

## 2019-11-07 MED ORDER — CYANOCOBALAMIN 1000 MCG/ML IJ SOLN
1000.0000 ug | Freq: Once | INTRAMUSCULAR | Status: AC
  Administered 2019-11-07: 1000 ug via INTRAMUSCULAR

## 2019-11-07 NOTE — Progress Notes (Signed)
Patient presented for B 12 injection to left deltoid, patient voiced no concerns nor showed any signs of distress during injection. 

## 2019-11-07 NOTE — Progress Notes (Signed)
FYI, pt received a B12 injection prior to labs this morning. Pt had a instrinsic factor drawn.

## 2019-11-11 NOTE — Progress Notes (Signed)
Your intrinisic factor antibody was positive,  So that means you will need to continue supplementing B12 with injections,  Not switch to oral.  If you would like to self administer,  The nurses can help you become comfortable doing this on your subsequent visits.  Regards,   Brookelin Felber, MD   

## 2019-11-11 NOTE — Progress Notes (Signed)
Your intrinisic factor antibody was positive,  So that means you will need to continue supplementing B12 with injections,  Not switch to oral.  If you would like to self administer,  The nurses can help you become comfortable doing this on your subsequent visits.  Regards,   Kenneth Lax, MD   

## 2019-11-12 LAB — METHYLMALONIC ACID, SERUM: Methylmalonic Acid, Quant: 168 nmol/L (ref 87–318)

## 2019-11-12 LAB — FOLATE RBC: RBC Folate: 614 ng/mL RBC (ref >280)

## 2019-11-12 LAB — INTRINSIC FACTOR ANTIBODIES: Intrinsic Factor: POSITIVE — AB

## 2019-11-16 ENCOUNTER — Ambulatory Visit
Admission: EM | Admit: 2019-11-16 | Discharge: 2019-11-16 | Disposition: A | Discharge status: Home / Self Care | Payer: Medicare Other | Attending: Family Medicine | Admitting: Family Medicine

## 2019-11-16 ENCOUNTER — Other Ambulatory Visit: Payer: Self-pay

## 2019-11-16 DIAGNOSIS — S61209A Unspecified open wound of unspecified finger without damage to nail, initial encounter: Secondary | ICD-10-CM

## 2019-11-16 DIAGNOSIS — W293XXA Contact with powered garden and outdoor hand tools and machinery, initial encounter: Secondary | ICD-10-CM | POA: Diagnosis not present

## 2019-11-16 NOTE — ED Triage Notes (Addendum)
Pt presents with c/o avulsion to tip of left middle finger. Pt was cutting monkey grass with handheld clippers and caught the tip of his finger. He does take Aspirin daily. Last Tetanus 2017.

## 2019-11-16 NOTE — Discharge Instructions (Signed)
Routine wound care with topical over the counter antibiotic twice daily; keep clean Monitor for any developing signs of infection and follow up if needed

## 2019-11-16 NOTE — ED Provider Notes (Signed)
[facility]    CSN: [id number] Arrival date & time: 11/16/19  1521      History   Chief Complaint Chief Complaint  Patient presents with  . Finger avulsion    11/16/19, at home    HPI Austin Duran is a 74 y.o. male.   74 yo male with a c/o avulsion to the tip of his left middle finger today while cutting with handheld clippers. Patient states he takes daily aspirin so bleeding has continued. Patient is up to date on his tetanus vaccine.      Past Medical History:  Diagnosis Date  . Depression   . Hearing loss    had a perforated right TM,   followed by Dr. Tami Ribas  . Hypertension   . Panic attacks   . PVC's (premature ventricular contractions)     Patient Active Problem List   Diagnosis Date Noted  . COVID-19 virus infection 09/26/2019  . SVT (supraventricular tachycardia) ([place]) 07/03/2018  . Insomnia 07/03/2018  . Exertional dyspnea 02/06/2017  . Encounter for preventive health examination 07/18/2015  . OSA (obstructive sleep apnea) 07/13/2014  . B12 deficiency 07/10/2014  . Cerumen impaction 07/10/2014  . Vitamin D deficiency 07/03/2013  . Enlarged prostate without lower urinary tract symptoms (luts) 07/02/2013  . Bilateral neuropathy of upper extremities 07/02/2013  . Hypertension 07/06/2012  . Basal cell carcinoma of back 01/02/2012  . Hearing loss, sensorineural, asymmetrical 01/02/2012  . S/P bilateral cataract extraction 01/02/2012  . Encounter for Medicare annual wellness exam 01/02/2012  . Obesity (BMI 30-39.9) 01/02/2012  . Screening for colon cancer 08/07/2011  . Screening for prostate cancer 08/07/2011  . Dyslipidemia with elevated low density lipoprotein cholesterol and abnormally low high density lipoprotein cholesterol 08/07/2011  . Lumbago 08/07/2011    Past Surgical History:  Procedure Laterality Date  . CATARACT EXTRACTION Bilateral Aug 2013   Porfilio  . CATARACT EXTRACTION, BILATERAL  May 2013   Porfilio  . EYE SURGERY   2008   eyelid surgery, Dr. Loletta Specter  . FRACTURE SURGERY    . [facility] , right        Home Medications    Prior to Admission medications   Medication Sig Start Date End Date Taking? Authorizing Provider  aspirin 81 MG tablet Take 81 mg by mouth daily.     Yes [provider]  Cholecalciferol (VITAMIN D-3 PO) Take 1 tablet by mouth daily.   Yes [provider]  citalopram (CELEXA) 20 MG tablet TAKE 1 TABLET(20 MG) BY MOUTH DAILY 09/19/19  Yes [name], MD  diltiazem (CARDIZEM CD) 120 MG 24 hr capsule Take 1 capsule (120 mg total) by mouth daily. 09/19/19  Yes [name], MD  losartan-hydrochlorothiazide (HYZAAR) 50-12.5 MG tablet Take 1 tablet by mouth daily. 09/19/19  Yes [name], MD    Family History Family History  Problem Relation Age of Onset  . Cancer Maternal Aunt        lung  . Cancer Paternal Uncle        colon    Social History Social History   Tobacco Use  . Smoking status: Former Smoker    Quit date: 08/03/2004    Years since quitting: 15.2  . Smokeless tobacco: Never Used  Substance Use Topics  . Alcohol use: Yes    Alcohol/week: 5.0 standard drinks    Types: 5 Standard drinks or equivalent per week  . Drug use:  No     Allergies   Augmentin [amoxicillin-pot clavulanate] and Lisinopril   Review of Systems Review of Systems   Physical Exam Triage Vital Signs ED Triage Vitals  Enc Vitals Group     BP 11/16/19 1536 133/77     Pulse Rate 11/16/19 1536 77     Resp 11/16/19 1536 18     Temp 11/16/19 1536 98.1 F (36.7 C)     Temp src --      SpO2 11/16/19 1536 97 %     Weight 11/16/19 1535 212 lb (96.2 kg)     Height 11/16/19 1535 5\' 8"  (1.727 m)     Head Circumference --      Peak Flow --      Pain Score 11/16/19 1534 3     Pain Loc --      Pain Edu? --      Excl. in [place]? --    No data found.  Updated Vital Signs BP 133/77 (BP Location: Right Arm)   Pulse 77   Temp  98.1 F (36.7 C)   Resp 18   Ht 5\' 8"  (1.727 m)   Wt 96.2 kg   SpO2 97%   BMI 32.23 kg/m   Visual Acuity Right Eye Distance:   Left Eye Distance:   Bilateral Distance:    Right Eye Near:   Left Eye Near:    Bilateral Near:     Physical Exam Vitals and nursing note reviewed.  Musculoskeletal:     Left hand: No bony tenderness. Normal range of motion. Normal strength. Normal sensation. There is no disruption of two-point discrimination. Normal capillary refill. Normal pulse.     Comments: Palmar tip of left middle finger with superficial skin complete avulsion (skin piece missing); actively bleeding (oozing)  Neurological:     Mental Status: He is alert.      UC Treatments / Results  Labs (all labs ordered are listed, but only abnormal results are displayed) Labs Reviewed - No data to display  EKG   Radiology No results found.  Procedures Procedures (including critical care time)  Medications Ordered in UC Medications - No data to display  Initial Impression / Assessment and Plan / UC Course  I have reviewed the triage vital signs and the nursing notes.  Pertinent labs & imaging results that were available during my care of the patient were reviewed by me and considered in my medical decision making (see chart for details).      Final Clinical Impressions(s) / UC Diagnoses   Final diagnoses:  Avulsion of skin of finger without complication, initial encounter  (left middle finger)   Discharge Instructions     Routine wound care with topical over the counter antibiotic twice daily; keep clean Monitor for any developing signs of infection and follow up if needed    ED Prescriptions    None     1. diagnosis reviewed with patient 2. hemostatis achieved with LET, pressure and silver nitrate 3. Recommend supportive treatment as above 4. Follow-up prn   PDMP not reviewed this encounter.   Norval Gable, MD 11/16/19 1630

## 2019-11-20 ENCOUNTER — Telehealth: Payer: Self-pay | Admitting: Internal Medicine

## 2019-11-20 NOTE — Telephone Encounter (Signed)
See result note.  

## 2019-11-20 NOTE — Telephone Encounter (Signed)
Pt returned your call for lab results.  

## 2019-12-11 ENCOUNTER — Ambulatory Visit (INDEPENDENT_AMBULATORY_CARE_PROVIDER_SITE_OTHER): Payer: Medicare Other | Admitting: *Deleted

## 2019-12-11 ENCOUNTER — Other Ambulatory Visit: Payer: Self-pay

## 2019-12-11 DIAGNOSIS — E538 Deficiency of other specified B group vitamins: Secondary | ICD-10-CM

## 2019-12-11 MED ORDER — CYANOCOBALAMIN 1000 MCG/ML IJ SOLN
1000.0000 ug | Freq: Once | INTRAMUSCULAR | Status: AC
  Administered 2019-12-11: 1000 ug via INTRAMUSCULAR

## 2019-12-11 NOTE — Progress Notes (Signed)
Patient presented for B 12 injection to right deltoid, patient voiced no concerns nor showed any signs of distress during injection. 

## 2020-01-14 ENCOUNTER — Other Ambulatory Visit: Payer: Self-pay

## 2020-01-14 ENCOUNTER — Ambulatory Visit (INDEPENDENT_AMBULATORY_CARE_PROVIDER_SITE_OTHER): Payer: Medicare Other

## 2020-01-14 DIAGNOSIS — E538 Deficiency of other specified B group vitamins: Secondary | ICD-10-CM

## 2020-01-14 MED ORDER — CYANOCOBALAMIN 1000 MCG/ML IJ SOLN
1000.0000 ug | Freq: Once | INTRAMUSCULAR | Status: AC
  Administered 2020-01-14: 1000 ug via INTRAMUSCULAR

## 2020-01-14 NOTE — Progress Notes (Signed)
Patient presented for B 12 injection to left deltoid, patient voiced no concerns nor showed any signs of distress during injection. 

## 2020-02-18 ENCOUNTER — Other Ambulatory Visit: Payer: Self-pay

## 2020-02-18 ENCOUNTER — Ambulatory Visit (INDEPENDENT_AMBULATORY_CARE_PROVIDER_SITE_OTHER): Payer: Medicare Other

## 2020-02-18 DIAGNOSIS — E538 Deficiency of other specified B group vitamins: Secondary | ICD-10-CM | POA: Diagnosis not present

## 2020-02-18 MED ORDER — CYANOCOBALAMIN 1000 MCG/ML IJ SOLN
1000.0000 ug | Freq: Once | INTRAMUSCULAR | Status: AC
  Administered 2020-02-18: 1000 ug via INTRAMUSCULAR

## 2020-02-18 NOTE — Progress Notes (Addendum)
Patient presented for B 12 injection to left deltoid, patient voiced no concerns nor showed any signs of distress during injection   I have reviewed the above information and agree with above.   Teresa Tullo, MD 

## 2020-03-05 ENCOUNTER — Telehealth: Payer: Self-pay | Admitting: Internal Medicine

## 2020-03-05 MED ORDER — LOSARTAN POTASSIUM-HCTZ 50-12.5 MG PO TABS: 1.0000 | ORAL_TABLET | Freq: Every day | ORAL | 0 refills | Status: DC

## 2020-03-05 NOTE — Telephone Encounter (Signed)
Pt needs a 90 day supply of losartan-hydrochlorothiazide (HYZAAR) 50-12.5 MG tablet

## 2020-03-06 ENCOUNTER — Ambulatory Visit (INDEPENDENT_AMBULATORY_CARE_PROVIDER_SITE_OTHER): Payer: Medicare Other

## 2020-03-06 VITALS — Ht 68.0 in | Wt 212.0 lb

## 2020-03-06 DIAGNOSIS — Z0000 Encounter for general adult medical examination without abnormal findings: Secondary | ICD-10-CM

## 2020-03-06 NOTE — Patient Instructions (Addendum)
Austin Duran , Thank you for taking time to come for your Medicare Wellness Visit. I appreciate your ongoing commitment to your health goals. Please review the following plan we discussed and let me know if I can assist you in the future.   These are the goals we discussed: Goals      Patient Stated     DIET - REDUCE PORTION SIZE (pt-stated)      I'd like to lose a little weight (30lb) Stay active Healthy diet       This is a list of the screening recommended for you and due dates:  Health Maintenance  Topic Date Due   COVID-19 Vaccine (1) 03/22/2020*   Flu Shot  04/05/2020   Colon Cancer Screening  08/03/2021   Tetanus Vaccine  02/14/2026    Hepatitis C: One time screening is recommended by Center for Disease Control  (CDC) for  adults born from [birth year] through 1965.   Completed   Pneumonia vaccines  Completed  *Topic was postponed. The date shown is not the original due date.   Immunizations Immunization History  Administered Date(s) Administered   Influenza Split 08/04/2011, 07/04/2012, 06/15/2015   Influenza, High Dose Seasonal PF 07/02/2013, 07/18/2017, 05/11/2019   Influenza,inj,Quad PF,6+ Mos 07/10/2014, 04/26/2016   Influenza,inj,quad, With Preservative 06/13/2018   Pneumococcal Conjugate-13 07/02/2013   Pneumococcal Polysaccharide-23 07/10/2014, 08/08/2016   Pneumococcal-Unspecified 07/10/2009   Tdap 08/04/2011, 02/15/2016   Zoster 07/26/2012   Keep all routine maintenance appointments.   Next scheduled nurse 03/19/20 B12 injection  Advanced directives: End of life planning; Advance aging; Advanced directives discussed.  Copy of current HCPOA/Living Will requested.    Conditions/risks identified: none new  Follow up in one year for your annual wellness visit.   Preventive Care 37 Years and Older, Male Preventive care refers to lifestyle choices and visits with your health care provider that can promote health and wellness. What does preventive  care include?  A yearly physical exam. This is also called an annual well check.  Dental exams once or twice a year.  Routine eye exams. Ask your health care provider how often you should have your eyes checked.  Personal lifestyle choices, including:  Daily care of your teeth and gums.  Regular physical activity.  Eating a healthy diet.  Avoiding tobacco and drug use.  Limiting alcohol use.  Practicing safe sex.  Taking low doses of aspirin every day.  Taking vitamin and mineral supplements as recommended by your health care provider. What happens during an annual well check? The services and screenings done by your health care provider during your annual well check will depend on your age, overall health, lifestyle risk factors, and family history of disease. Counseling  Your health care provider may ask you questions about your:  Alcohol use.  Tobacco use.  Drug use.  Emotional well-being.  Home and relationship well-being.  Sexual activity.  Eating habits.  History of falls.  Memory and ability to understand (cognition).  Work and work Statistician. Screening  You may have the following tests or measurements:  Height, weight, and BMI.  Blood pressure.  Lipid and cholesterol levels. These may be checked every 5 years, or more frequently if you are over 6 years old.  Skin check.  Lung cancer screening. You may have this screening every year starting at age 79 if you have a 30-pack-year history of smoking and currently smoke or have quit within the past 15 years.  Fecal occult blood test (FOBT) of  the stool. You may have this test every year starting at age 64.  Flexible sigmoidoscopy or colonoscopy. You may have a sigmoidoscopy every 5 years or a colonoscopy every 10 years starting at age 20.  Prostate cancer screening. Recommendations will vary depending on your family history and other risks.  Hepatitis C blood test.  Hepatitis B blood  test.  Sexually transmitted disease (STD) testing.  Diabetes screening. This is done by checking your blood sugar (glucose) after you have not eaten for a while (fasting). You may have this done every 1-3 years.  Abdominal aortic aneurysm (AAA) screening. You may need this if you are a current or former smoker.  Osteoporosis. You may be screened starting at age 27 if you are at high risk. Talk with your health care provider about your test results, treatment options, and if necessary, the need for more tests. Vaccines  Your health care provider may recommend certain vaccines, such as:  Influenza vaccine. This is recommended every year.  Tetanus, diphtheria, and acellular pertussis (Tdap, Td) vaccine. You may need a Td booster every 10 years.  Zoster vaccine. You may need this after age 38.  Pneumococcal 13-valent conjugate (PCV13) vaccine. One dose is recommended after age 18.  Pneumococcal polysaccharide (PPSV23) vaccine. One dose is recommended after age 58. Talk to your health care provider about which screenings and vaccines you need and how often you need them. This information is not intended to replace advice given to you by your health care provider. Make sure you discuss any questions you have with your health care provider. Document Released: 09/18/2015 Document Revised: 05/11/2016 Document Reviewed: 06/23/2015 Elsevier Interactive Patient Education  2017 [employer] Prevention in the Home Falls can cause injuries. They can happen to people of all ages. There are many things you can do to make your home safe and to help prevent falls. What can I do on the outside of my home?  Regularly fix the edges of walkways and driveways and fix any cracks.  Remove anything that might make you trip as you walk through a door, such as a raised step or threshold.  Trim any bushes or trees on the path to your home.  Use bright outdoor lighting.  Clear any walking paths of  anything that might make someone trip, such as rocks or tools.  Regularly check to see if handrails are loose or broken. Make sure that both sides of any steps have handrails.  Any raised decks and porches should have guardrails on the edges.  Have any leaves, snow, or ice cleared regularly.  Use sand or salt on walking paths during winter.  Clean up any spills in your garage right away. This includes oil or grease spills. What can I do in the bathroom?  Use night lights.  Install grab bars by the toilet and in the tub and shower. Do not use towel bars as grab bars.  Use non-skid mats or decals in the tub or shower.  If you need to sit down in the shower, use a plastic, non-slip stool.  Keep the floor dry. Clean up any water that spills on the floor as soon as it happens.  Remove soap buildup in the tub or shower regularly.  Attach bath mats securely with double-sided non-slip rug tape.  Do not have throw rugs and other things on the floor that can make you trip. What can I do in the bedroom?  Use night lights.  Make sure that  you have a light by your bed that is easy to reach.  Do not use any sheets or blankets that are too big for your bed. They should not hang down onto the floor.  Have a firm chair that has side arms. You can use this for support while you get dressed.  Do not have throw rugs and other things on the floor that can make you trip. What can I do in the kitchen?  Clean up any spills right away.  Avoid walking on wet floors.  Keep items that you use a lot in easy-to-reach places.  If you need to reach something above you, use a strong step stool that has a grab bar.  Keep electrical cords out of the way.  Do not use floor polish or wax that makes floors slippery. If you must use wax, use non-skid floor wax.  Do not have throw rugs and other things on the floor that can make you trip. What can I do with my stairs?  Do not leave any items on the  stairs.  Make sure that there are handrails on both sides of the stairs and use them. Fix handrails that are broken or loose. Make sure that handrails are as long as the stairways.  Check any carpeting to make sure that it is firmly attached to the stairs. Fix any carpet that is loose or worn.  Avoid having throw rugs at the top or bottom of the stairs. If you do have throw rugs, attach them to the floor with carpet tape.  Make sure that you have a light switch at the top of the stairs and the bottom of the stairs. If you do not have them, ask someone to add them for you. What else can I do to help prevent falls?  Wear shoes that:  Do not have high heels.  Have rubber bottoms.  Are comfortable and fit you well.  Are closed at the toe. Do not wear sandals.  If you use a stepladder:  Make sure that it is fully opened. Do not climb a closed stepladder.  Make sure that both sides of the stepladder are locked into place.  Ask someone to hold it for you, if possible.  Clearly mark and make sure that you can see:  Any grab bars or handrails.  First and last steps.  Where the edge of each step is.  Use tools that help you move around (mobility aids) if they are needed. These include:  Canes.  Walkers.  Scooters.  Crutches.  Turn on the lights when you go into a dark area. Replace any light bulbs as soon as they burn out.  Set up your furniture so you have a clear path. Avoid moving your furniture around.  If any of your floors are uneven, fix them.  If there are any pets around you, be aware of where they are.  Review your medicines with your doctor. Some medicines can make you feel dizzy. This can increase your chance of falling. Ask your doctor what other things that you can do to help prevent falls. This information is not intended to replace advice given to you by your health care provider. Make sure you discuss any questions you have with your health care  provider. Document Released: 06/18/2009 Document Revised: 01/28/2016 Document Reviewed: 09/26/2014 Elsevier Interactive Patient Education  2017 [employer].

## 2020-03-06 NOTE — Progress Notes (Addendum)
Subjective:   Austin Duran is a 74 y.o. male who presents for Medicare Annual/Subsequent preventive examination.  Review of Systems    No ROS.  Medicare Wellness Virtual Visit.    Cardiac Risk Factors include: advanced age (>84men, >47 women);male gender;hypertension     Objective:    Today's Vitals   03/06/20 1132  Weight: 212 lb (96.2 kg)  Height: 5\' 8"  (1.727 m)   Body mass index is 32.23 kg/m.  Advanced Directives 03/06/2020 03/06/2019 07/18/2017 07/15/2016 07/23/2015  Does Patient Have a Medical Advance Directive? Yes No Yes Yes Yes  Type of Paramedic of [place];Living will - Living will;Healthcare Power of [place];Living will [place];Living will  Does patient want to make changes to medical advance directive? No - Patient declined - No - Patient declined - No - Patient declined  Copy of [place] in Chart? No - copy requested - No - copy requested No - copy requested No - copy requested  Would patient like information on creating a medical advance directive? - No - Patient declined - - -    Current Medications (verified) Outpatient Encounter Medications as of 03/06/2020  Medication Sig  . aspirin 81 MG tablet Take 81 mg by mouth daily.    . Cholecalciferol (VITAMIN D-3 PO) Take 1 tablet by mouth daily.  . citalopram (CELEXA) 20 MG tablet TAKE 1 TABLET(20 MG) BY MOUTH DAILY  . diltiazem (CARDIZEM CD) 120 MG 24 hr capsule Take 1 capsule (120 mg total) by mouth daily.  [name] losartan-hydrochlorothiazide (HYZAAR) 50-12.5 MG tablet Take 1 tablet by mouth daily.   No facility-administered encounter medications on file as of 03/06/2020.    Allergies (verified) Augmentin [amoxicillin-pot clavulanate] and Lisinopril   History: Past Medical History:  Diagnosis Date  . Depression   . Hearing loss    had a perforated right TM,   followed by Dr. Tami Ribas  . Hypertension   . Panic  attacks   . PVC's (premature ventricular contractions)    Past Surgical History:  Procedure Laterality Date  . CATARACT EXTRACTION Bilateral Aug 2013   Porfilio  . CATARACT EXTRACTION, BILATERAL  May 2013   Porfilio  . EYE SURGERY  2008   eyelid surgery, Dr. Loletta Specter  . FRACTURE SURGERY    . [facility] , right    Family History  Problem Relation Age of Onset  . Cancer Maternal Aunt        lung  . Cancer Paternal Uncle        colon   Social History   Socioeconomic History  . Marital status: Married    Spouse name: Not on file  . Number of children: Not on file  . Years of education: Not on file  . Highest education level: Not on file  Occupational History    Comment: works 5 days a week  Tobacco Use  . Smoking status: Former Smoker    Quit date: 08/03/2004    Years since quitting: 15.6  . Smokeless tobacco: Never Used  Vaping Use  . Vaping Use: Never used  Substance and Sexual Activity  . Alcohol use: Yes    Alcohol/week: 5.0 standard drinks    Types: 5 Standard drinks or equivalent per week  . Drug use: No  . Sexual activity: Yes  Other Topics Concern  . Not on file  Social History Narrative  . Not on  file   Social Determinants of Health   Financial Resource Strain:   . Difficulty of Paying Living Expenses:   Food Insecurity:   . Worried About Charity fundraiser in the Last Year:   . Arboriculturist in the Last Year:   Transportation Needs:   . Film/video editor (Medical):   [name] Lack of Transportation (Non-Medical):   Physical Activity:   . Days of Exercise per Week:   . Minutes of Exercise per Session:   Stress:   . Feeling of Stress :   Social Connections:   . Frequency of Communication with Friends and Family:   . Frequency of Social Gatherings with Friends and Family:   . Attends Religious Services:   . Active Member of Clubs or Organizations:   . Attends Archivist Meetings:   [name] Marital Status:      Tobacco Counseling Counseling given: Not Answered   Clinical Intake:  Pre-visit preparation completed: Yes        Diabetes: No  How often do you need to have someone help you when you read instructions, pamphlets, or other written materials from your doctor or pharmacy?: 1 - Never  Interpreter Needed?: No      Activities of Daily Living In your present state of health, do you have any difficulty performing the following activities: 03/06/2020  Hearing? N  Vision? N  Difficulty concentrating or making decisions? N  Walking or climbing stairs? N  Dressing or bathing? N  Doing errands, shopping? N  Preparing Food and eating ? N  Using the Toilet? N  In the past six months, have you accidently leaked urine? N  Do you have problems with loss of bowel control? N  Managing your Medications? N  Managing your Finances? N  Housekeeping or managing your Housekeeping? N  Some recent data might be hidden    Patient Care Team: [name], MD as PCP - General (Internal Medicine)  Indicate any recent Medical Services you may have received from other than [facility] providers in the past year (date may be approximate).     Assessment:   This is a routine wellness examination for [name].  I connected with [name] today by telephone and verified that I am speaking with the correct person using two identifiers. Location patient: home Location provider: work Persons participating in the virtual visit: patient, Marine scientist.    I discussed the limitations, risks, security and privacy concerns of performing an evaluation and management service by telephone and the availability of in person appointments. The patient expressed understanding and verbally consented to this telephonic visit.    Interactive audio and video telecommunications were attempted between this provider and patient, however failed, due to patient having technical difficulties OR patient did not have access to video  capability.  We continued and completed visit with audio only.  Some vital signs may be absent or patient reported.   Hearing/Vision screen  Hearing Screening   125Hz  250Hz  500Hz  1000Hz  2000Hz  3000Hz  4000Hz  6000Hz  8000Hz   Right ear:           Left ear:           Comments: Patient is able to hear conversational tones without difficulty.  No issues reported.  Vision Screening Comments: Followed by [facility] Cataract extraction, bilateral Visual acuity not assessed, virtual visit.  They have seen their ophthalmologist.   Dietary issues and exercise activities discussed: Regular diet Good fluid intake  Current Exercise  Habits: Home exercise routine  Goals      Patient Stated   .  DIET - REDUCE PORTION SIZE (pt-stated)      I'd like to lose a little weight (30lb) Stay active Healthy diet      Depression Screen PHQ 2/9 Scores 03/06/2020 03/06/2019 07/03/2018 07/18/2017 08/08/2016 07/15/2016 07/23/2015  PHQ - 2 Score 0 0 0 0 0 0 0  PHQ- 9 Score - - 1 0 - - -    Fall Risk Fall Risk  03/06/2020 09/19/2019 03/06/2019 07/18/2017 08/08/2016  Falls in the past year? 0 1 0 No No  Number falls in past yr: 0 0 - - -  Injury with Fall? - 0 - - -  Follow up Falls evaluation completed Falls evaluation completed - - -   Handrails in use when climbing stairs? Yes  Home free of loose throw rugs in walkways, pet beds, electrical cords, etc? Yes  Adequate lighting in your home to reduce risk of falls? Yes   ASSISTIVE DEVICES UTILIZED TO PREVENT FALLS:  Life alert? No  Use of a cane, walker or w/c? No  Grab bars in the bathroom? Yes  Shower chair or bench in shower? Yes  Elevated toilet seat or a handicapped toilet? No   TIMED UP AND GO: Was the test performed? No .   Cognitive Function: Patient is alert and oriented x3 Denies difficulty with memory or making decisions.  Actively working 5 days weekly.   MMSE - Mini Mental State Exam 03/06/2020 07/15/2016 07/23/2015  Not  completed: Unable to complete - -  Orientation to time - 5 5  Orientation to Place - 5 5  Registration - 3 3  Attention/ Calculation - 5 5  Recall - 3 3  Language- name 2 objects - 2 2  Language- repeat - 1 1  Language- follow 3 step command - 3 3  Language- read & follow direction - 1 1  Write a sentence - 1 1  Copy design - 1 1  Total score - 30 30     6CIT Screen 03/06/2019 07/18/2017 07/15/2016  What Year? 0 points 0 points 0 points  What month? 0 points 0 points 0 points  What time? 0 points 0 points 0 points  Count back from 20 0 points 0 points 0 points  Months in reverse 0 points 0 points 0 points  Repeat phrase 0 points 0 points -  Total Score 0 0 -   Immunizations Immunization History  Administered Date(s) Administered  . Influenza Split 08/04/2011, 07/04/2012, 06/15/2015  . Influenza, High Dose Seasonal PF 07/02/2013, 07/18/2017, 05/11/2019  . Influenza,inj,Quad PF,6+ Mos 07/10/2014, 04/26/2016  . Influenza,inj,quad, With Preservative 06/13/2018  . Pneumococcal Conjugate-13 07/02/2013  . Pneumococcal Polysaccharide-23 07/10/2014, 08/08/2016  . Pneumococcal-Unspecified 07/10/2009  . Tdap 08/04/2011, 02/15/2016  . Zoster 07/26/2012   Covid vaccine- declined.  Health Maintenance Health Maintenance  Topic Date Due  . COVID-19 Vaccine (1) 03/22/2020 (Originally 08/06/1958)  . INFLUENZA VACCINE  04/05/2020  . COLONOSCOPY  08/03/2021  . TETANUS/TDAP  02/14/2026  . Hepatitis C Screening  Completed  . PNA vac Low Risk Adult  Completed   Dental Screening: Recommended annual dental exams for proper oral hygiene  Community Resource Referral / Chronic Care Management: CRR required this visit?  No   CCM required this visit?  No      Plan:   Keep all routine maintenance appointments.   Next scheduled nurse 03/19/20 B12 injection  I have personally  reviewed and noted the following in the patient's chart:   . Medical and social history . Use of alcohol, tobacco  or illicit drugs  . Current medications and supplements . Functional ability and status . Nutritional status . Physical activity . Advanced directives . List of other physicians . Hospitalizations, surgeries, and ER visits in previous 12 months . Vitals . Screenings to include cognitive, depression, and falls . Referrals and appointments  In addition, I have reviewed and discussed with patient certain preventive protocols, quality metrics, and best practice recommendations. A written personalized care plan for preventive services as well as general preventive health recommendations were provided to patient via mail.     OBrien-Blaney, Lazara Grieser L, LPN   0/02/8933     I have reviewed the above information and agree with above.   Deborra Medina, MD

## 2020-03-19 ENCOUNTER — Ambulatory Visit (INDEPENDENT_AMBULATORY_CARE_PROVIDER_SITE_OTHER): Payer: Medicare Other

## 2020-03-19 ENCOUNTER — Other Ambulatory Visit: Payer: Self-pay

## 2020-03-19 DIAGNOSIS — E538 Deficiency of other specified B group vitamins: Secondary | ICD-10-CM

## 2020-03-19 MED ORDER — CYANOCOBALAMIN 1000 MCG/ML IJ SOLN
1000.0000 ug | Freq: Once | INTRAMUSCULAR | Status: AC
  Administered 2020-03-19: 1000 ug via INTRAMUSCULAR

## 2020-03-19 NOTE — Progress Notes (Signed)
Patient presented for B 12 injection to left deltoid, patient voiced no concerns nor showed any signs of distress during injection. 

## 2020-03-26 ENCOUNTER — Other Ambulatory Visit: Payer: Self-pay

## 2020-03-26 ENCOUNTER — Encounter: Payer: Self-pay | Admitting: Internal Medicine

## 2020-03-26 ENCOUNTER — Ambulatory Visit (INDEPENDENT_AMBULATORY_CARE_PROVIDER_SITE_OTHER): Payer: Medicare Other | Admitting: Internal Medicine

## 2020-03-26 VITALS — BP 148/72 | HR 72 | Ht 68.0 in | Wt 218.0 lb

## 2020-03-26 DIAGNOSIS — I4891 Unspecified atrial fibrillation: Secondary | ICD-10-CM | POA: Diagnosis not present

## 2020-03-26 DIAGNOSIS — I493 Ventricular premature depolarization: Secondary | ICD-10-CM | POA: Diagnosis not present

## 2020-03-26 MED ORDER — FUROSEMIDE 20 MG PO TABS: ORAL_TABLET | ORAL | 1 refills | Status: DC

## 2020-03-26 MED ORDER — CITALOPRAM HYDROBROMIDE 20 MG PO TABS: ORAL_TABLET | ORAL | 1 refills | Status: DC

## 2020-03-26 NOTE — Progress Notes (Signed)
Patient Care Team: [name], MD as PCP - General (Internal Medicine)   HPI  Austin Duran is a 74 y.o. male Seen in follow-up for PVCs and atrial fib detected on recent event recorder     The patient denies chest pain,  nocturnal dyspnea, orthopnea    There have been no palpitations, lightheadedness or syncope.    Complaining of dyspnea -- mild edema  Dietary indiscretion with salt load at lunch      Sleep disordered breathing and evening somnolence  Has dx sleep apnea, but not interested in treatment >> breatheright strips recommended      DATE TEST EF   7/18 Echo   55-65   %         Date Cr K Hgb  12//18 0.98 4.1   10/19  1.17 4.2   2/21 1.1 3.9 33.5    Thromboembolic risk factors ( age -45, HTN-1) for a CHADSVASc Score of 2  Dx with B12 deficiency + IF  Records and Results Reviewed   Past Medical History:  Diagnosis Date  . Depression   . Hearing loss    had a perforated right TM,   followed by Dr. Tami Ribas  . Hypertension   . Panic attacks   . PVC's (premature ventricular contractions)     Past Surgical History:  Procedure Laterality Date  . CATARACT EXTRACTION Bilateral Aug 2013   Porfilio  . CATARACT EXTRACTION, BILATERAL  May 2013   Porfilio  . EYE SURGERY  2008   eyelid surgery, Dr. Loletta Specter  . FRACTURE SURGERY    . [facility] , right     Current Meds  Medication Sig  . aspirin 81 MG tablet Take 81 mg by mouth daily.    . Cholecalciferol (VITAMIN D-3 PO) Take 1 tablet by mouth daily.  . citalopram (CELEXA) 20 MG tablet TAKE 1 TABLET(20 MG) BY MOUTH DAILY  . diltiazem (CARDIZEM CD) 120 MG 24 hr capsule Take 1 capsule (120 mg total) by mouth daily.  [name] losartan-hydrochlorothiazide (HYZAAR) 50-12.5 MG tablet Take 1 tablet by mouth daily.    Allergies  Allergen Reactions  . Augmentin [Amoxicillin-Pot Clavulanate] Nausea Only  . Lisinopril Cough      Review of Systems negative except from HPI and  PMH  Physical Exam BP (!) 148/72 (BP Location: Left Arm, Patient Position: Sitting, Cuff Size: Normal)   Pulse 72   Ht 5\' 8"  (1.727 m)   Wt 218 lb (98.9 kg)   SpO2 97%   BMI 33.15 kg/m  Well developed and nourished in no acute distress HENT normal Neck supple with JVP- 7-8  Clear Regular rate and rhythm, no murmurs or gallops Abd-soft with active BS No Clubbing cyanosis traceedema Skin-warm and dry A & Oriented  Grossly normal sensory and motor function  ECG sinus @ 72 15/09/42  Assessment and  Plan  PVCs-RVOT-exercise associated  Hypertension with hypertensive heart disease  Obesity-BMI greater than 30  Dyspnea  Atrial Fib SCAF  BP remains elevated but   discussed the physiology of heart failure including the importance of salt restriction and fluid restriction and have reviewed sources of dietary salt and water.   We will begin low-dose furosemide 20 mg once or twice a week on the weekends when he is not working.  Encouraged weight loss.  Ectopy is pretty quiescient.  Dyspnea may be related to blood pressure; in this regard his blood  pressure went to 210  After just 1/2 minutes of exercise 2018 --but again weight loss and salt restriction may help this prior to adding more medication.  We will review this again in 6 months

## 2020-03-26 NOTE — Patient Instructions (Signed)
Medication Instructions:  - Your physician has recommended you make the following change in your medication:   1) Start lasix (furosemide) 20 mg- take 1 tablet by mouth 1-2 times a week as needed for increased shortness of breath/ swelling  *If you need a refill on your cardiac medications before your next appointment, please call your pharmacy*   Lab Work: - none ordered  If you have labs (blood work) drawn today and your tests are completely normal, you will receive your results only by: [name] MyChart Message (if you have MyChart) OR . A paper copy in the mail If you have any lab test that is abnormal or we need to change your treatment, we will call you to review the results.   Testing/Procedures: - none ordered   Follow-Up: At [facility], you and your health needs are our priority.  As part of our continuing mission to provide you with exceptional heart care, we have created designated Provider Care Teams.  These Care Teams include your primary Cardiologist (physician) and Advanced Practice Providers (APPs -  Physician Assistants and Nurse Practitioners) who all work together to provide you with the care you need, when you need it.  We recommend signing up for the patient portal called "MyChart".  Sign up information is provided on this After Visit Summary.  MyChart is used to connect with patients for Virtual Visits (Telemedicine).  Patients are able to view lab/test results, encounter notes, upcoming appointments, etc.  Non-urgent messages can be sent to your provider as well.   To learn more about what you can do with MyChart, go to [email or web address].    Your next appointment:   6 month(s)  The format for your next appointment:   In Person  Provider:   Virl Axe, MD   Other Instructions n/a

## 2020-04-22 ENCOUNTER — Other Ambulatory Visit: Payer: Self-pay

## 2020-04-22 ENCOUNTER — Ambulatory Visit (INDEPENDENT_AMBULATORY_CARE_PROVIDER_SITE_OTHER): Payer: Medicare Other

## 2020-04-22 DIAGNOSIS — E538 Deficiency of other specified B group vitamins: Secondary | ICD-10-CM | POA: Diagnosis not present

## 2020-04-22 MED ORDER — CYANOCOBALAMIN 1000 MCG/ML IJ SOLN
1000.0000 ug | Freq: Once | INTRAMUSCULAR | Status: AC
  Administered 2020-04-22: 1000 ug via INTRAMUSCULAR

## 2020-04-22 NOTE — Progress Notes (Signed)
Patient presented for B 12 injection to right deltoid, patient voiced no concerns nor showed any signs of distress during injection. 

## 2020-04-27 DIAGNOSIS — H43813 Vitreous degeneration, bilateral: Secondary | ICD-10-CM | POA: Diagnosis not present

## 2020-05-26 ENCOUNTER — Ambulatory Visit (INDEPENDENT_AMBULATORY_CARE_PROVIDER_SITE_OTHER): Payer: Medicare Other

## 2020-05-26 ENCOUNTER — Other Ambulatory Visit: Payer: Self-pay

## 2020-05-26 DIAGNOSIS — E538 Deficiency of other specified B group vitamins: Secondary | ICD-10-CM

## 2020-05-26 MED ORDER — CYANOCOBALAMIN 1000 MCG/ML IJ SOLN
1000.0000 ug | Freq: Once | INTRAMUSCULAR | Status: AC
  Administered 2020-05-26: 1000 ug via INTRAMUSCULAR

## 2020-05-26 NOTE — Progress Notes (Signed)
Patient presented for B 12 injection to right deltoid, patient voiced no concerns nor showed any signs of distress during injection. 

## 2020-06-09 ENCOUNTER — Other Ambulatory Visit: Payer: Self-pay | Admitting: Internal Medicine

## 2020-07-28 ENCOUNTER — Other Ambulatory Visit: Payer: Self-pay

## 2020-07-28 ENCOUNTER — Ambulatory Visit (INDEPENDENT_AMBULATORY_CARE_PROVIDER_SITE_OTHER): Payer: Medicare Other

## 2020-07-28 DIAGNOSIS — E538 Deficiency of other specified B group vitamins: Secondary | ICD-10-CM

## 2020-07-28 DIAGNOSIS — Z23 Encounter for immunization: Secondary | ICD-10-CM | POA: Diagnosis not present

## 2020-07-28 MED ORDER — CYANOCOBALAMIN 1000 MCG/ML IJ SOLN
1000.0000 ug | Freq: Once | INTRAMUSCULAR | Status: AC
  Administered 2020-07-28: 1000 ug via INTRAMUSCULAR

## 2020-07-28 NOTE — Progress Notes (Signed)
Patient presented for B 12 injection to right deltoid, patient voiced no concerns nor showed any signs of distress during injection. 

## 2020-08-26 ENCOUNTER — Encounter: Payer: Self-pay | Admitting: Emergency Medicine

## 2020-08-26 ENCOUNTER — Ambulatory Visit
Admission: EM | Admit: 2020-08-26 | Discharge: 2020-08-26 | Disposition: A | Discharge status: Home / Self Care | Payer: Medicare Other | Attending: Sports Medicine | Admitting: Sports Medicine

## 2020-08-26 ENCOUNTER — Other Ambulatory Visit: Payer: Self-pay

## 2020-08-26 DIAGNOSIS — H9201 Otalgia, right ear: Secondary | ICD-10-CM

## 2020-08-26 DIAGNOSIS — H6121 Impacted cerumen, right ear: Secondary | ICD-10-CM | POA: Diagnosis not present

## 2020-08-26 DIAGNOSIS — H9191 Unspecified hearing loss, right ear: Secondary | ICD-10-CM

## 2020-08-26 NOTE — ED Provider Notes (Addendum)
[facility]    CSN: [id number] Arrival date & time: 08/26/20  0854      History   Chief Complaint Chief Complaint  Patient presents with  . Otalgia    HPI ALEXIZ CORDIA is a 74 y.o. male.   Patient pleasant 73 year old male who presents for evaluation of right ear discomfort and a sense that it is clogged for about 3 days.  He also has a little bit of pressure.  Some mild hearing loss.  He reports that when he was younger he had a hole in his eardrum so he is a little bit apprehensive about any significant treatment.  He denies any sinus pressure.  No fever shakes chills.  No chest pain shortness of breath.  No Covid exposure.  He has had his flu vaccine but he has not had his Covid vaccine.     Past Medical History:  Diagnosis Date  . Depression   . Hearing loss    had a perforated right TM,   followed by Dr. Tami Ribas  . Hypertension   . Panic attacks   . PVC's (premature ventricular contractions)     Patient Active Problem List   Diagnosis Date Noted  . COVID-19 virus infection 09/26/2019  . SVT (supraventricular tachycardia) ([place]) 07/03/2018  . Insomnia 07/03/2018  . Exertional dyspnea 02/06/2017  . Encounter for preventive health examination 07/18/2015  . OSA (obstructive sleep apnea) 07/13/2014  . B12 deficiency 07/10/2014  . Cerumen impaction 07/10/2014  . Vitamin D deficiency 07/03/2013  . Enlarged prostate without lower urinary tract symptoms (luts) 07/02/2013  . Bilateral neuropathy of upper extremities 07/02/2013  . Hypertension 07/06/2012  . Basal cell carcinoma of back 01/02/2012  . Hearing loss, sensorineural, asymmetrical 01/02/2012  . S/P bilateral cataract extraction 01/02/2012  . Encounter for Medicare annual wellness exam 01/02/2012  . Obesity (BMI 30-39.9) 01/02/2012  . Screening for colon cancer 08/07/2011  . Screening for prostate cancer 08/07/2011  . Dyslipidemia with elevated low density lipoprotein cholesterol and abnormally  low high density lipoprotein cholesterol 08/07/2011  . Lumbago 08/07/2011    Past Surgical History:  Procedure Laterality Date  . CATARACT EXTRACTION Bilateral Aug 2013   Porfilio  . CATARACT EXTRACTION, BILATERAL  May 2013   Porfilio  . EYE SURGERY  2008   eyelid surgery, Dr. Loletta Specter  . FRACTURE SURGERY    . [facility] , right        Home Medications    Prior to Admission medications   Medication Sig Start Date End Date Taking? Authorizing Provider  aspirin 81 MG tablet Take 81 mg by mouth daily.   Yes [provider]  Cholecalciferol (VITAMIN D-3 PO) Take 1 tablet by mouth daily.   Yes [provider]  citalopram (CELEXA) 20 MG tablet TAKE 1 TABLET(20 MG) BY MOUTH DAILY 03/26/20  Yes [name], MD  diltiazem (CARDIZEM CD) 120 MG 24 hr capsule Take 1 capsule (120 mg total) by mouth daily. 09/19/19  Yes [name], MD  furosemide (LASIX) 20 MG tablet Take 1 tablet (20 mg) by mouth 1-2 times per week as needed for increase shortness of breath/ swelling 03/26/20  Yes Deboraha Sprang, MD  losartan-hydrochlorothiazide [facility]) 50-12.5 MG tablet TAKE 1 TABLET BY MOUTH DAILY 06/10/20  Yes [name], MD    Family History Family History  Problem Relation Age of Onset  . Cancer Maternal Aunt  lung  . Cancer Paternal Uncle        colon    Social History Social History   Tobacco Use  . Smoking status: Former Smoker    Quit date: 08/03/2004    Years since quitting: 16.0  . Smokeless tobacco: Never Used  Vaping Use  . Vaping Use: Never used  Substance Use Topics  . Alcohol use: Yes    Alcohol/week: 5.0 standard drinks    Types: 5 Standard drinks or equivalent per week  . Drug use: No     Allergies   Augmentin [amoxicillin-pot clavulanate] and Lisinopril   Review of Systems Review of Systems  Constitutional: Negative for chills, fatigue and fever.  HENT: Positive for ear pain and hearing loss.  Negative for congestion, rhinorrhea, sinus pressure, sinus pain, sneezing and sore throat.   Respiratory: Negative for cough, choking and shortness of breath.   Cardiovascular: Negative for chest pain.  All other systems reviewed and are negative.    Physical Exam Triage Vital Signs ED Triage Vitals  Enc Vitals Group     BP 08/26/20 0935 139/71     Pulse Rate 08/26/20 0935 66     Resp 08/26/20 0935 18     Temp 08/26/20 0935 98.7 F (37.1 C)     Temp Source 08/26/20 0935 Oral     SpO2 08/26/20 0935 98 %     Weight 08/26/20 0934 215 lb (97.5 kg)     Height 08/26/20 0934 5\' 8"  (1.727 m)     Head Circumference --      Peak Flow --      Pain Score 08/26/20 0934 0     Pain Loc --      Pain Edu? --      Excl. in [place]? --    No data found.  Updated Vital Signs BP 139/71 (BP Location: Right Arm)   Pulse 66   Temp 98.7 F (37.1 C) (Oral)   Resp 18   Ht 5\' 8"  (1.727 m)   Wt 97.5 kg   SpO2 98%   BMI 32.69 kg/m   Visual Acuity Right Eye Distance:   Left Eye Distance:   Bilateral Distance:    Right Eye Near:   Left Eye Near:    Bilateral Near:     Physical Exam Vitals and nursing note reviewed.  Constitutional:      General: He is not in acute distress.    Appearance: Normal appearance. He is not ill-appearing, toxic-appearing or diaphoretic.  HENT:     Head: Normocephalic and atraumatic.     Right Ear: There is impacted cerumen.     Left Ear: Tympanic membrane normal.     Nose: Nose normal.     Mouth/Throat:     Mouth: Mucous membranes are moist.     Pharynx: Oropharynx is clear. No oropharyngeal exudate or posterior oropharyngeal erythema.  Eyes:     Extraocular Movements: Extraocular movements intact.     Pupils: Pupils are equal, round, and reactive to light.  Cardiovascular:     Rate and Rhythm: Normal rate and regular rhythm.     Pulses: Normal pulses.     Heart sounds: Normal heart sounds. No murmur heard. No gallop.   Pulmonary:     Effort: Pulmonary  effort is normal.     Breath sounds: Normal breath sounds.  Musculoskeletal:     Cervical back: Normal range of motion and neck supple.  Neurological:     Mental Status: He  is alert.      UC Treatments / Results  Labs (all labs ordered are listed, but only abnormal results are displayed) Labs Reviewed - No data to display  EKG   Radiology No results found.  Procedures Procedures (including critical care time)  Medications Ordered in UC Medications - No data to display  Initial Impression / Assessment and Plan / UC Course  I have reviewed the triage vital signs and the nursing notes.  Pertinent labs & imaging results that were available during my care of the patient were reviewed by me and considered in my medical decision making (see chart for details).   Pleasant 74 year old male with impacted cerumen on the right ear causing some pressure discomfort and some mild hearing loss.  Treatment plan  #1 the findings and treatment plan were discussed in detail with the patient.  Patient was in agreement.  #2 I offered him right ear irrigation and syringing.  He declined on this.  He was willing to allow me to attempt to remove some of the impacted cerumen with a curette.  I was able to remove about 50%. 3.  Over-the-counter earwax removal, mineral oil and cotton swab at night in the right ear to try to loosen some of that cerumen. 4.  Follow-up here as needed.    Final Clinical Impressions(s) / UC Diagnoses   Final diagnoses:  Impacted cerumen of right ear  Right ear pain  Acute hearing loss, right     Discharge Instructions     I offered him right ear irrigation and syringing.  He declined on this.  He was willing to allow me to attempt to remove some of the impacted cerumen with a curette.  I was able to remove about 50%. Over-the-counter earwax removal, mineral oil and cotton swab at night in the right ear to try to loosen some of that cerumen. Follow-up here as  needed.    ED Prescriptions    None     PDMP not reviewed this encounter.   Verda Cumins, MD 08/26/20 1027    Verda Cumins, MD 08/26/20 1227

## 2020-08-26 NOTE — ED Triage Notes (Signed)
Patient c/o right ear pain and pressure that started 3 days ago.

## 2020-08-26 NOTE — Discharge Instructions (Addendum)
I offered him right ear irrigation and syringing.  He declined on this.  He was willing to allow me to attempt to remove some of the impacted cerumen with a curette.  I was able to remove about 50%. Over-the-counter earwax removal, mineral oil and cotton swab at night in the right ear to try to loosen some of that cerumen. Follow-up here as needed.

## 2020-09-01 ENCOUNTER — Ambulatory Visit (INDEPENDENT_AMBULATORY_CARE_PROVIDER_SITE_OTHER): Payer: Medicare Other

## 2020-09-01 ENCOUNTER — Other Ambulatory Visit: Payer: Self-pay

## 2020-09-01 DIAGNOSIS — E538 Deficiency of other specified B group vitamins: Secondary | ICD-10-CM

## 2020-09-01 MED ORDER — CYANOCOBALAMIN 1000 MCG/ML IJ SOLN
1000.0000 ug | Freq: Once | INTRAMUSCULAR | Status: AC
  Administered 2020-09-01: 09:00:00 1000 ug via INTRAMUSCULAR

## 2020-09-01 NOTE — Progress Notes (Signed)
Patient presented for B 12 injection to left deltoid, patient voiced no concerns nor showed any signs of distress during injection. 

## 2020-09-02 ENCOUNTER — Other Ambulatory Visit: Payer: Self-pay | Admitting: Internal Medicine

## 2020-09-24 ENCOUNTER — Other Ambulatory Visit: Payer: Self-pay

## 2020-09-24 ENCOUNTER — Ambulatory Visit (INDEPENDENT_AMBULATORY_CARE_PROVIDER_SITE_OTHER): Payer: Medicare Other | Admitting: Internal Medicine

## 2020-09-24 ENCOUNTER — Encounter: Payer: Self-pay | Admitting: Internal Medicine

## 2020-09-24 VITALS — BP 148/80 | HR 77 | Ht 69.0 in | Wt 217.2 lb

## 2020-09-24 DIAGNOSIS — R0602 Shortness of breath: Secondary | ICD-10-CM | POA: Diagnosis not present

## 2020-09-24 DIAGNOSIS — I493 Ventricular premature depolarization: Secondary | ICD-10-CM

## 2020-09-24 DIAGNOSIS — I4891 Unspecified atrial fibrillation: Secondary | ICD-10-CM

## 2020-09-24 NOTE — Progress Notes (Signed)
Patient Care Team: [name], MD as PCP - General (Internal Medicine)   HPI  Austin Duran is a 75 y.o. male Seen in follow-up for PVCs and atrial fib detected on recent event recorder    The patient denies chest pain, nocturnal dyspnea, orthopnea.  There have been no palpitations, lightheadedness or syncope.  Patient has predictable moderate dyspnea on exertion; however, he is able to limit his activities so as not to encroach upon this threshold.   No associated chest discomfort.  Some peripheral edema.  Takes diuretics 2 or 3 times a week; but does not notice any improvement in his dyspnea temporally associated with his diuretic.  Salt intake replete: ((  Sleep disordered breathing and evening somnolence  Has dx sleep apnea, but not interested in treatment >> breatheright strips recommended      DATE TEST EF   7/18 Echo   55-65   % LVH mild        Date Cr K Hgb  12//18 0.98 4.1   10/19  1.17 4.2   2/21 1.1 3.9 99.2    Thromboembolic risk factors ( age -59, HTN-1) for a CHADSVASc Score of 2  Dx with B12 deficiency + IF  Records and Results Reviewed   Past Medical History:  Diagnosis Date  . Depression   . Hearing loss    had a perforated right TM,   followed by Dr. Tami Ribas  . Hypertension   . Panic attacks   . PVC's (premature ventricular contractions)     Past Surgical History:  Procedure Laterality Date  . CATARACT EXTRACTION Bilateral Aug 2013   Porfilio  . CATARACT EXTRACTION, BILATERAL  May 2013   Porfilio  . EYE SURGERY  2008   eyelid surgery, Dr. Loletta Specter  . FRACTURE SURGERY    . [facility] , right     Current Meds  Medication Sig  . Cholecalciferol (VITAMIN D-3 PO) Take 1 tablet by mouth daily.  . citalopram (CELEXA) 20 MG tablet TAKE 1 TABLET(20 MG) BY MOUTH DAILY  . diltiazem (CARDIZEM CD) 120 MG 24 hr capsule Take 1 capsule (120 mg total) by mouth daily.  . furosemide (LASIX) 20 MG tablet TAKE 1  TABLET BY MOUTH 1 TO 2 TIMES WEEKLY AS NEEDED FOR INCREASE SHORTNESS OF BREATH/ SWELLING  . losartan-hydrochlorothiazide (HYZAAR) 50-12.5 MG tablet TAKE 1 TABLET BY MOUTH DAILY    Allergies  Allergen Reactions  . Augmentin [Amoxicillin-Pot Clavulanate] Nausea Only  . Lisinopril Cough      Review of Systems negative except from HPI and PMH  Physical Exam BP (!) 148/80 (BP Location: Left Arm, Patient Position: Sitting, Cuff Size: Normal)   Pulse 77   Ht 5\' 9"  (1.753 m)   Wt 217 lb 4 oz (98.5 kg)   SpO2 98%   BMI 32.08 kg/m  Well developed and nourished in no acute distress HENT normal Neck supple with JVP-  flat  6 Clear Regular rate and rhythm, no murmurs or gallops Abd-soft with active BS No Clubbing cyanosis edema Skin-warm and dry A & Oriented  Grossly normal sensory and motor function  ECG sinus at 77 Interval 15/08/42 PACs  Assessment and  Plan  Dyspnea on exertion  PVCs-RVOT-exercise associated  Hypertension with hypertensive heart disease  Obesity-BMI greater than 30  Atrial Fib SCAF   Dyspnea on exertion seems to be worsening.  He is able to limit his activities on  a daily basis so as not to encroach.  The differential would include exercise-induced hypertension, exercise-induced ectopy, heart failure with preserved or not preserved ejection fraction and ischemia.  We will undertake Myoview scanning which will allow [place] to get that much of the above.  There may be a role for SGLT2   No clinical atrial fibrillation to justify anticoagulation.  We will continue to follow  Not interested sleep apnea therapy

## 2020-09-24 NOTE — Patient Instructions (Signed)
Medication Instructions:  - Your physician recommends that you continue on your current medications as directed. Please refer to the Current Medication list given to you today.  *If you need a refill on your cardiac medications before your next appointment, please call your pharmacy*   Lab Work: - none ordered  If you have labs (blood work) drawn today and your tests are completely normal, you will receive your results only by: [name] MyChart Message (if you have MyChart) OR . A paper copy in the mail If you have any lab test that is abnormal or we need to change your treatment, we will call you to review the results.   Testing/Procedures: - Your physician has requested that you have an exercise stress myoview.    [place]  Your caregiver has ordered a Stress Test with nuclear imaging. The purpose of this test is to evaluate the blood supply to your heart muscle. This procedure is referred to as a "Non-Invasive Stress Test." This is because other than having an IV started in your vein, nothing is inserted or "invades" your body. Cardiac stress tests are done to find areas of poor blood flow to the heart by determining the extent of coronary artery disease (CAD). Some patients exercise on a treadmill, which naturally increases the blood flow to your heart, while others who are  unable to walk on a treadmill due to physical limitations have a pharmacologic/chemical stress agent called Lexiscan . This medicine will mimic walking on a treadmill by temporarily increasing your coronary blood flow.   Please note: these test may take anywhere between 2-4 hours to complete  PLEASE REPORT TO [facility] AT THE FIRST DESK WILL DIRECT YOU WHERE TO GO  Date of Procedure:_____________________________________  Arrival Time for Procedure:______________________________  Instructions regarding medication:   _x___:  Hold lasix (furosemide) the morning of the  procedure  _x___:  You may take all of your other regular medications the morning of the test with enough water to get them down safely.  PLEASE NOTIFY THE OFFICE AT LEAST 87 HOURS IN ADVANCE IF YOU ARE UNABLE TO KEEP YOUR APPOINTMENT.  [phone] AND  PLEASE NOTIFY NUCLEAR MEDICINE AT [facility] AT LEAST 24 HOURS IN ADVANCE IF YOU ARE UNABLE TO KEEP YOUR APPOINTMENT. [phone]  How to prepare for your Myoview test:  1. Do not eat or drink after midnight 2. No caffeine for 24 hours prior to test 3. No smoking 24 hours prior to test. 4. Your medication may be taken with water.  If your doctor stopped a medication because of this test, do not take that medication. 5. Ladies, please do not wear dresses.  Skirts or pants are appropriate. Please wear a short sleeve shirt. 6. No perfume, cologne or lotion. 7. Wear comfortable walking shoes. No heels!   COVID PRE- TEST: You will need a COVID TEST prior to the procedure:  LOCATION: [facility].  DATE/TIME:  _______________ (8:00 am- 10:00 am)   Follow-Up: At [facility], you and your health needs are our priority.  As part of our continuing mission to provide you with exceptional heart care, we have created designated Provider Care Teams.  These Care Teams include your primary Cardiologist (physician) and Advanced Practice Providers (APPs -  Physician Assistants and Nurse Practitioners) who all work together to provide you with the care you need, when you need it.  We recommend signing up for the patient portal called "  MyChart".  Sign up information is provided on this After Visit Summary.  MyChart is used to connect with patients for Virtual Visits (Telemedicine).  Patients are able to view lab/test results, encounter notes, upcoming appointments, etc.  Non-urgent messages can be sent to your provider as well.   To learn more about what you can do with MyChart, go to [email or web address].     Your next appointment:   9 month(s)  The format for your next appointment:   In Person  Provider:   Virl Axe, MD   Other Instructions   Cardiac Nuclear Scan A cardiac nuclear scan is a test that measures blood flow to the heart when a person is resting and when he or she is exercising. The test looks for problems such as:  Not enough blood reaching a portion of the heart.  The heart muscle not working normally. You may need this test if:  You have heart disease.  You have had abnormal lab results.  You have had heart surgery or a balloon procedure to open up blocked arteries (angioplasty).  You have chest pain.  You have shortness of breath. In this test, a radioactive dye (tracer) is injected into your bloodstream. After the tracer has traveled to your heart, an imaging device is used to measure how much of the tracer is absorbed by or distributed to various areas of your heart. This procedure is usually done at a hospital and takes 2-4 hours. Tell a health care provider about:  Any allergies you have.  All medicines you are taking, including vitamins, herbs, eye drops, creams, and over-the-counter medicines.  Any problems you or family members have had with anesthetic medicines.  Any blood disorders you have.  Any surgeries you have had.  Any medical conditions you have.  Whether you are pregnant or may be pregnant. What are the risks? Generally, this is a safe procedure. However, problems may occur, including:  Serious chest pain and heart attack. This is only a risk if the stress portion of the test is done.  Rapid heartbeat.  Sensation of warmth in your chest. This usually passes quickly.  Allergic reaction to the tracer. What happens before the procedure?  Ask your health care provider about changing or stopping your regular medicines. This is especially important if you are taking diabetes medicines or blood thinners.  Follow instructions  from your health care provider about eating or drinking restrictions.  Remove your jewelry on the day of the procedure. What happens during the procedure?  An IV will be inserted into one of your veins.  Your health care provider will inject a small amount of radioactive tracer through the IV.  You will wait for 20-40 minutes while the tracer travels through your bloodstream.  Your heart activity will be monitored with an electrocardiogram (ECG).  You will lie down on an exam table.  Images of your heart will be taken for about 15-20 minutes.  You may also have a stress test. For this test, one of the following may be done: ? You will exercise on a treadmill or stationary bike. While you exercise, your heart's activity will be monitored with an ECG, and your blood pressure will be checked. ? You will be given medicines that will increase blood flow to parts of your heart. This is done if you are unable to exercise.  When blood flow to your heart has peaked, a tracer will again be injected through the IV.  After 20-40 minutes, you  will get back on the exam table and have more images taken of your heart.  Depending on the type of tracer used, scans may need to be repeated 3-4 hours later.  Your IV line will be removed when the procedure is over. The procedure may vary among health care providers and hospitals. What happens after the procedure?  Unless your health care provider tells you otherwise, you may return to your normal schedule, including diet, activities, and medicines.  Unless your health care provider tells you otherwise, you may increase your fluid intake. This will help to flush the contrast dye from your body. Drink enough fluid to keep your urine pale yellow.  Ask your health care provider, or the department that is doing the test: ? When will my results be ready? ? How will I get my results? Summary  A cardiac nuclear scan measures the blood flow to the heart  when a person is resting and when he or she is exercising.  Tell your health care provider if you are pregnant.  Before the procedure, ask your health care provider about changing or stopping your regular medicines. This is especially important if you are taking diabetes medicines or blood thinners.  After the procedure, unless your health care provider tells you otherwise, increase your fluid intake. This will help flush the contrast dye from your body.  After the procedure, unless your health care provider tells you otherwise, you may return to your normal schedule, including diet, activities, and medicines. This information is not intended to replace advice given to you by your health care provider. Make sure you discuss any questions you have with your health care provider. Document Revised: 02/05/2018 Document Reviewed: 02/05/2018 Elsevier Patient Education  [place].

## 2020-10-02 ENCOUNTER — Ambulatory Visit (INDEPENDENT_AMBULATORY_CARE_PROVIDER_SITE_OTHER): Payer: Medicare Other

## 2020-10-02 ENCOUNTER — Other Ambulatory Visit
Admission: RE | Admit: 2020-10-02 | Discharge: 2020-10-02 | Disposition: A | Discharge status: Home / Self Care | Payer: Medicare Other | Source: Ambulatory Visit | Attending: Internal Medicine | Admitting: Internal Medicine

## 2020-10-02 ENCOUNTER — Other Ambulatory Visit: Payer: Self-pay

## 2020-10-02 DIAGNOSIS — E538 Deficiency of other specified B group vitamins: Secondary | ICD-10-CM

## 2020-10-02 DIAGNOSIS — Z01812 Encounter for preprocedural laboratory examination: Secondary | ICD-10-CM | POA: Insufficient documentation

## 2020-10-02 DIAGNOSIS — Z20822 Contact with and (suspected) exposure to covid-19: Secondary | ICD-10-CM | POA: Insufficient documentation

## 2020-10-02 LAB — SARS CORONAVIRUS 2 (TAT 6-24 HRS): SARS Coronavirus 2: NEGATIVE

## 2020-10-02 MED ORDER — CYANOCOBALAMIN 1000 MCG/ML IJ SOLN
1000.0000 ug | Freq: Once | INTRAMUSCULAR | Status: AC
  Administered 2020-10-02: 1000 ug via INTRAMUSCULAR

## 2020-10-02 NOTE — Progress Notes (Signed)
Patient presented for B 12 injection to left deltoid, patient voiced no concerns nor showed any signs of distress during injection. 

## 2020-10-05 ENCOUNTER — Encounter
Admission: RE | Admit: 2020-10-05 | Discharge: 2020-10-05 | Disposition: A | Discharge status: Home / Self Care | Payer: Medicare Other | Source: Ambulatory Visit | Attending: Internal Medicine | Admitting: Internal Medicine

## 2020-10-05 ENCOUNTER — Other Ambulatory Visit: Payer: Self-pay

## 2020-10-05 DIAGNOSIS — R0602 Shortness of breath: Secondary | ICD-10-CM

## 2020-10-05 LAB — NM MYOCAR MULTI W/SPECT W/WALL MOTION / EF
Estimated workload: 6.2 METS
Exercise duration (min): 4 min
Exercise duration (sec): 25 s
LV dias vol: 77 mL (ref 62–150)
LV sys vol: 29 mL
MPHR: 146 {beats}/min
Peak HR: 116 {beats}/min
Percent HR: 79 %
Rest HR: 71 {beats}/min
SDS: 6
SRS: 6
SSS: 10
TID: 0.81

## 2020-10-05 MED ORDER — TECHNETIUM TC 99M TETROFOSMIN IV KIT
30.9950 | PACK | Freq: Once | INTRAVENOUS | Status: AC | PRN
  Administered 2020-10-05: 30.995 via INTRAVENOUS

## 2020-10-05 MED ORDER — REGADENOSON 0.4 MG/5ML IV SOLN
0.4000 mg | Freq: Once | INTRAVENOUS | Status: AC
  Administered 2020-10-05: 0.4 mg via INTRAVENOUS
  Filled 2020-10-05: qty 5

## 2020-10-05 MED ORDER — TECHNETIUM TC 99M TETROFOSMIN IV KIT
10.0000 | PACK | Freq: Once | INTRAVENOUS | Status: AC | PRN
  Administered 2020-10-05: 10.76 via INTRAVENOUS

## 2020-10-16 ENCOUNTER — Telehealth: Payer: Self-pay | Admitting: Internal Medicine

## 2020-10-16 NOTE — Telephone Encounter (Signed)
Austin Sprang, MD  10/13/2020 1:39 PM EST      Please Inform Patient that stress test showed no evidence of ischemia, s normal heart muscle function--overall this is a great report.   BP was an issue  And with his salt intake we might be able to help symptoms with some augmented diuresis  Lets have him try Lasix 20 mg daily in addition to losartan HCT for 10 days and let us know how it helps  Thanks

## 2020-10-16 NOTE — Telephone Encounter (Signed)
I called and spoke with the patient regarding his stess test results. He has also been advised of Dr. Olin Pia recommendations to: 1) take lasix 20 mg once daily x 10 days (along with his losartan-hctz) to see if this will help his BP 2) watch his salt intake  I asked the patient if he checks his BP at home. Per the patient, he has a cuff, but does not typically check this.  I have asked him to check this periodically over the next 10 days and I will call him back to follow up on his numbers. I asked if he could check a reading today that would be good to get a baseline.  The patient voices understanding of the above recommendations and is agreeable.

## 2020-10-29 DIAGNOSIS — M25562 Pain in left knee: Secondary | ICD-10-CM | POA: Diagnosis not present

## 2020-11-03 ENCOUNTER — Ambulatory Visit (INDEPENDENT_AMBULATORY_CARE_PROVIDER_SITE_OTHER): Payer: Medicare Other

## 2020-11-03 ENCOUNTER — Other Ambulatory Visit: Payer: Self-pay

## 2020-11-03 DIAGNOSIS — E538 Deficiency of other specified B group vitamins: Secondary | ICD-10-CM

## 2020-11-03 MED ORDER — CYANOCOBALAMIN 1000 MCG/ML IJ SOLN
1000.0000 ug | Freq: Once | INTRAMUSCULAR | Status: AC
  Administered 2020-11-03: 1000 ug via INTRAMUSCULAR

## 2020-11-03 NOTE — Progress Notes (Signed)
Patient presented for B 12 injection to left deltoid, patient voiced no concerns nor showed any signs of distress during injection. 

## 2020-11-09 DIAGNOSIS — M179 Osteoarthritis of knee, unspecified: Secondary | ICD-10-CM | POA: Diagnosis not present

## 2020-11-09 DIAGNOSIS — M25562 Pain in left knee: Secondary | ICD-10-CM | POA: Diagnosis not present

## 2020-11-10 ENCOUNTER — Telehealth: Payer: Self-pay | Admitting: *Deleted

## 2020-11-10 NOTE — Telephone Encounter (Signed)
Attempted to call the patient to follow up on his BP readings.  I was able to speak with him, but he advised he did not have these readings with him at the moment. He advised he will try to call back with these tomorrow.  I advised the patient this is fine- will await a call back with readings.

## 2020-11-10 NOTE — Telephone Encounter (Signed)
-----   Message from Emily Filbert, RN sent at 10/16/2020 10:59 AM EST ----- Call him back to follow up on BP readings- see 2/11 phone note.

## 2020-11-30 ENCOUNTER — Other Ambulatory Visit: Payer: Self-pay | Admitting: Internal Medicine

## 2020-12-04 ENCOUNTER — Other Ambulatory Visit: Payer: Self-pay

## 2020-12-04 ENCOUNTER — Ambulatory Visit (INDEPENDENT_AMBULATORY_CARE_PROVIDER_SITE_OTHER): Payer: Medicare Other | Admitting: *Deleted

## 2020-12-04 DIAGNOSIS — E538 Deficiency of other specified B group vitamins: Secondary | ICD-10-CM | POA: Diagnosis not present

## 2020-12-04 MED ORDER — CYANOCOBALAMIN 1000 MCG/ML IJ SOLN
1000.0000 ug | Freq: Once | INTRAMUSCULAR | Status: AC
  Administered 2020-12-04: 1000 ug via INTRAMUSCULAR

## 2020-12-04 NOTE — Progress Notes (Signed)
Patient presented for B 12 injection to right deltoid, patient voiced no concerns nor showed any signs of distress during injection. 

## 2020-12-05 ENCOUNTER — Other Ambulatory Visit: Payer: Self-pay | Admitting: Internal Medicine

## 2021-01-05 ENCOUNTER — Other Ambulatory Visit: Payer: Self-pay

## 2021-01-05 ENCOUNTER — Ambulatory Visit (INDEPENDENT_AMBULATORY_CARE_PROVIDER_SITE_OTHER): Payer: Medicare Other

## 2021-01-05 DIAGNOSIS — E538 Deficiency of other specified B group vitamins: Secondary | ICD-10-CM | POA: Diagnosis not present

## 2021-01-05 MED ORDER — CYANOCOBALAMIN 1000 MCG/ML IJ SOLN
1000.0000 ug | Freq: Once | INTRAMUSCULAR | Status: AC
Start: 2021-01-05 — End: 2021-01-05
  Administered 2021-01-05: 1000 ug via INTRAMUSCULAR

## 2021-01-05 NOTE — Progress Notes (Signed)
Patient presented for B 12 injection to left deltoid, patient voiced no concerns nor showed any signs of distress during injection. 

## 2021-02-05 ENCOUNTER — Other Ambulatory Visit: Payer: Self-pay

## 2021-02-05 ENCOUNTER — Ambulatory Visit (INDEPENDENT_AMBULATORY_CARE_PROVIDER_SITE_OTHER): Payer: Medicare Other

## 2021-02-05 DIAGNOSIS — E538 Deficiency of other specified B group vitamins: Secondary | ICD-10-CM

## 2021-02-05 MED ORDER — CYANOCOBALAMIN 1000 MCG/ML IJ SOLN
1000.0000 ug | Freq: Once | INTRAMUSCULAR | Status: AC
  Administered 2021-02-05: 1000 ug via INTRAMUSCULAR

## 2021-02-05 NOTE — Progress Notes (Signed)
Patient presented for B 12 injection to left deltoid, patient voiced no concerns nor showed any signs of distress during injection. 

## 2021-03-09 ENCOUNTER — Other Ambulatory Visit: Payer: Self-pay

## 2021-03-09 ENCOUNTER — Ambulatory Visit (INDEPENDENT_AMBULATORY_CARE_PROVIDER_SITE_OTHER): Payer: Medicare Other

## 2021-03-09 VITALS — Ht 69.0 in | Wt 217.0 lb

## 2021-03-09 DIAGNOSIS — E538 Deficiency of other specified B group vitamins: Secondary | ICD-10-CM | POA: Diagnosis not present

## 2021-03-09 DIAGNOSIS — Z0000 Encounter for general adult medical examination without abnormal findings: Secondary | ICD-10-CM | POA: Diagnosis not present

## 2021-03-09 MED ORDER — CYANOCOBALAMIN 1000 MCG/ML IJ SOLN
1000.0000 ug | Freq: Once | INTRAMUSCULAR | Status: AC
  Administered 2021-03-09: 10:00:00 1000 ug via INTRAMUSCULAR

## 2021-03-09 NOTE — Progress Notes (Addendum)
Subjective:   Austin Duran is a 75 y.o. male who presents for Medicare Annual/Subsequent preventive examination.  Review of Systems    No ROS.  Medicare Wellness Virtual Visit.  Visual/audio telehealth visit, UTA vital signs.   See social history for additional risk factors.   Cardiac Risk Factors include: advanced age (>55men, >13 women);male gender;hypertension     Objective:    Today's Vitals   03/09/21 1254  Weight: 217 lb (98.4 kg)  Height: 5\' 9"  (1.753 m)   Body mass index is 32.05 kg/m.  Advanced Directives 03/09/2021 03/06/2020 03/06/2019 07/18/2017 07/15/2016 07/23/2015  Does Patient Have a Medical Advance Directive? No Yes No Yes Yes Yes  Type of Advance Directive - [place];Living will - Living will;Healthcare Power of [place];Living will [place];Living will  Does patient want to make changes to medical advance directive? No - Patient declined No - Patient declined - No - Patient declined - No - Patient declined  Copy of [place] in Chart? - No - copy requested - No - copy requested No - copy requested No - copy requested  Would patient like information on creating a medical advance directive? - - No - Patient declined - - -    Current Medications (verified) Outpatient Encounter Medications as of 03/09/2021  Medication Sig   Cholecalciferol (VITAMIN D-3 PO) Take 1 tablet by mouth daily.   citalopram (CELEXA) 20 MG tablet TAKE 1 TABLET(20 MG) BY MOUTH DAILY   diltiazem (CARDIZEM CD) 120 MG 24 hr capsule TAKE 1 CAPSULE(120 MG) BY MOUTH DAILY   furosemide (LASIX) 20 MG tablet TAKE 1 TABLET BY MOUTH 1 TO 2 TIMES WEEKLY AS NEEDED FOR INCREASE SHORTNESS OF BREATH/ SWELLING   losartan-hydrochlorothiazide (HYZAAR) 50-12.5 MG tablet TAKE 1 TABLET BY MOUTH DAILY   No facility-administered encounter medications on file as of 03/09/2021.    Allergies (verified) Augmentin [amoxicillin-pot  clavulanate] and Lisinopril   History: Past Medical History:  Diagnosis Date   Depression    Hearing loss    had a perforated right TM,   followed by Dr. Tami Ribas   Hypertension    Panic attacks    PVC's (premature ventricular contractions)    Past Surgical History:  Procedure Laterality Date   CATARACT EXTRACTION Bilateral Aug 2013   Porfilio   CATARACT EXTRACTION, BILATERAL  May 2013   Porfilio   EYE SURGERY  2008   eyelid surgery, Dr. Loletta Specter   FRACTURE SURGERY     TYMPANIC MEMBRANE REPAIR     [facility] , right    Family History  Problem Relation Age of Onset   Cancer Maternal Aunt        lung   Cancer Paternal Uncle        colon   Social History   Socioeconomic History   Marital status: Married    Spouse name: Not on file   Number of children: Not on file   Years of education: Not on file   Highest education level: Not on file  Occupational History    Comment: works 5 days a week  Tobacco Use   Smoking status: Former    Pack years: 0.00    Types: Cigarettes    Quit date: 08/03/2004    Years since quitting: 16.6   Smokeless tobacco: Never  Vaping Use   Vaping Use: Never used  Substance and Sexual Activity   Alcohol use: Yes  Alcohol/week: 5.0 standard drinks    Types: 5 Standard drinks or equivalent per week   Drug use: No   Sexual activity: Yes  Other Topics Concern   Not on file  Social History Narrative   Not on file   Social Determinants of Health   Financial Resource Strain: Low Risk    Difficulty of Paying Living Expenses: Not hard at all  Food Insecurity: No Food Insecurity   Worried About Charity fundraiser in the Last Year: Never true   [place] in the Last Year: Never true  Transportation Needs: No Transportation Needs   Lack of Transportation (Medical): No   Lack of Transportation (Non-Medical): No  Physical Activity: Not on file  Stress: No Stress Concern Present   Feeling of Stress : Not at all  Social Connections:  Unknown   Frequency of Communication with Friends and Family: More than three times a week   Frequency of Social Gatherings with Friends and Family: More than three times a week   Attends Religious Services: Not on Electrical engineer or Organizations: Not on file   Attends Archivist Meetings: Not on file   Marital Status: Not on file    Tobacco Counseling Counseling given: Not Answered   Clinical Intake:  Pre-visit preparation completed: Yes        Diabetes: No  How often do you need to have someone help you when you read instructions, pamphlets, or other written materials from your doctor or pharmacy?: 1 - Never  Interpreter Needed?: No      Activities of Daily Living In your present state of health, do you have any difficulty performing the following activities: 03/09/2021  Hearing? Y  Vision? N  Difficulty concentrating or making decisions? N  Walking or climbing stairs? N  Dressing or bathing? N  Doing errands, shopping? N  Preparing Food and eating ? N  Using the Toilet? N  In the past six months, have you accidently leaked urine? N  Do you have problems with loss of bowel control? N  Managing your Medications? N  Managing your Finances? N  Housekeeping or managing your Housekeeping? N  Some recent data might be hidden    Patient Care Team: [name], MD as PCP - General (Internal Medicine)  Indicate any recent Medical Services you may have received from other than [facility] providers in the past year (date may be approximate).     Assessment:   This is a routine wellness examination for [name].  I connected with Austin Duran today by telephone and verified that I am speaking with the correct person using two identifiers. Location patient: home Location provider: work Persons participating in the virtual visit: patient, Marine scientist.    I discussed the limitations, risks, security and privacy concerns of performing an evaluation and  management service by telephone and the availability of in person appointments. The patient expressed understanding and verbally consented to this telephonic visit.    Interactive audio and video telecommunications were attempted between this provider and patient, however failed, due to patient having technical difficulties OR patient did not have access to video capability.  We continued and completed visit with audio only.  Some vital signs may be absent or patient reported.   Hearing/Vision screen Hearing Screening - Comments:: Patient has difficulty hearing conversational tones. Occupational hearing loss. Audiology deferred per patient preference.    Vision Screening - Comments:: Cataract extraction, bilateral  They have  regular follow up with the ophthalmologist  Dietary issues and exercise activities discussed: Current Exercise Habits: Home exercise routine, Time (Minutes): 30, Frequency (Times/Week): 5, Weekly Exercise (Minutes/Week): 150, Intensity: Mild   Goals Addressed               This Visit's Progress     Patient Stated     DIET - REDUCE PORTION SIZE (pt-stated)        Stay active Portion control         Depression Screen PHQ 2/9 Scores 03/09/2021 03/06/2020 03/06/2019 07/03/2018 07/18/2017 08/08/2016 07/15/2016  PHQ - 2 Score 0 0 0 0 0 0 0  PHQ- 9 Score - - - 1 0 - -    Fall Risk Fall Risk  03/09/2021 03/06/2020 09/19/2019 03/06/2019 07/18/2017  Falls in the past year? 0 0 1 0 No  Number falls in past yr: 0 0 0 - -  Injury with Fall? 0 - 0 - -  Follow up Falls evaluation completed Falls evaluation completed Falls evaluation completed - -   ASSISTIVE DEVICES UTILIZED TO PREVENT FALLS:  Life alert? No  Use of a cane, walker or w/c? No   TIMED UP AND GO: Was the test performed? No. Virtual visit.   Cognitive Function: Patient is alert and oriented x3.  Denies difficulty focusing, making decisions, memory loss.  MMSE - Mini Mental State Exam 03/06/2020 07/15/2016  07/23/2015  Not completed: Unable to complete - -  Orientation to time - 5 5  Orientation to Place - 5 5  Registration - 3 3  Attention/ Calculation - 5 5  Recall - 3 3  Language- name 2 objects - 2 2  Language- repeat - 1 1  Language- follow 3 step command - 3 3  Language- read & follow direction - 1 1  Write a sentence - 1 1  Copy design - 1 1  Total score - 30 30     6CIT Screen 03/09/2021 03/06/2019 07/18/2017 07/15/2016  What Year? 0 points 0 points 0 points 0 points  What month? 0 points 0 points 0 points 0 points  What time? 0 points 0 points 0 points 0 points  Count back from 20 0 points 0 points 0 points 0 points  Months in reverse 0 points 0 points 0 points 0 points  Repeat phrase 0 points 0 points 0 points -  Total Score 0 0 0 -    Immunizations Immunization History  Administered Date(s) Administered   Fluad Quad(high Dose 65+) 07/28/2020   Influenza Split 08/04/2011, 07/04/2012, 06/15/2015   Influenza, High Dose Seasonal PF 07/02/2013, 07/18/2017, 05/11/2019   Influenza,inj,Quad PF,6+ Mos 07/10/2014, 04/26/2016   Influenza,inj,quad, With Preservative 06/13/2018   Pneumococcal Conjugate-13 07/02/2013   Pneumococcal Polysaccharide-23 07/10/2014, 08/08/2016   Pneumococcal-Unspecified 07/10/2009   Tdap 08/04/2011, 02/15/2016   Zoster, Live 07/26/2012   Covid vaccine- not yet completed. Deferred.   Shingles vaccine- plans to update immunization record next office visit.   Health Maintenance Health Maintenance  Topic Date Due   COVID-19 Vaccine (1) 03/25/2021 (Originally 08/07/1951)   Zoster Vaccines- Shingrix (1 of 2) 06/09/2021 (Originally 08/06/1965)   INFLUENZA VACCINE  04/05/2021   COLONOSCOPY (Pts 45-93yrs Insurance coverage will need to be confirmed)  08/03/2021   TETANUS/TDAP  02/14/2026   Hepatitis C Screening  Completed   PNA vac Low Risk Adult  Completed   HPV VACCINES  Aged Out   Colonoscopy- declined.   Lung Cancer Screening: (Low Dose CT Chest  recommended if Age 16-80 years, 69 pack-year currently smoking OR have quit w/in 15years.) does not qualify.   Dental Screening: Recommended annual dental exams for proper oral hygiene  Community Resource Referral / Chronic Care Management: CRR required this visit?  No   CCM required this visit?  No      Plan:   Keep all routine maintenance appointments.   I have personally reviewed and noted the following in the patient's chart:   Medical and social history Use of alcohol, tobacco or illicit drugs  Current medications and supplements including opioid prescriptions. Patient is not currently taking opioid prescriptions. Functional ability and status Nutritional status Physical activity Advanced directives List of other physicians Hospitalizations, surgeries, and ER visits in previous 12 months Vitals Screenings to include cognitive, depression, and falls Referrals and appointments  In addition, I have reviewed and discussed with patient certain preventive protocols, quality metrics, and best practice recommendations. A written personalized care plan for preventive services as well as general preventive health recommendations were provided to patient via mail.     OBrien-Blaney, Ramatoulaye Pack L, LPN   09/10/1094    I have reviewed the above information and agree with above.   Deborra Medina, MD

## 2021-03-09 NOTE — Progress Notes (Signed)
Patient presented for B 12 injection to left deltoid, patient voiced no concerns nor showed any signs of distress during injection. 

## 2021-03-09 NOTE — Patient Instructions (Addendum)
Austin Duran , Thank you for taking time to come for your Medicare Wellness Visit. I appreciate your ongoing commitment to your health goals. Please review the following plan we discussed and let me know if I can assist you in the future.   These are the goals we discussed:  Goals       Patient Stated     DIET - REDUCE PORTION SIZE (pt-stated)      Stay active Portion control          This is a list of the screening recommended for you and due dates:  Health Maintenance  Topic Date Due   COVID-19 Vaccine (1) 03/25/2021*   Zoster (Shingles) Vaccine (1 of 2) 06/09/2021*   Flu Shot  04/05/2021   Colon Cancer Screening  08/03/2021   Tetanus Vaccine  02/14/2026   Hepatitis C Screening: USPSTF Recommendation to screen - Ages 18-79 yo.  Completed   Pneumonia vaccines  Completed   HPV Vaccine  Aged Out  *Topic was postponed. The date shown is not the original due date.   Advanced directives: not yet completed  Conditions/risks identified: none new  Next appointment: Follow up in one year for your annual wellness visit.   Preventive Care 75 Years and Older, Male Preventive care refers to lifestyle choices and visits with your health care provider that can promote health and wellness. What does preventive care include? A yearly physical exam. This is also called an annual well check. Dental exams once or twice a year. Routine eye exams. Ask your health care provider how often you should have your eyes checked. Personal lifestyle choices, including: Daily care of your teeth and gums. Regular physical activity. Eating a healthy diet. Avoiding tobacco and drug use. Limiting alcohol use. Practicing safe sex. Taking low doses of aspirin every day. Taking vitamin and mineral supplements as recommended by your health care provider. What happens during an annual well check? The services and screenings done by your health care provider during your annual well check will depend on your  age, overall health, lifestyle risk factors, and family history of disease. Counseling  Your health care provider may ask you questions about your: Alcohol use. Tobacco use. Drug use. Emotional well-being. Home and relationship well-being. Sexual activity. Eating habits. History of falls. Memory and ability to understand (cognition). Work and work Statistician. Screening  You may have the following tests or measurements: Height, weight, and BMI. Blood pressure. Lipid and cholesterol levels. These may be checked every 5 years, or more frequently if you are over 32 years old. Skin check. Lung cancer screening. You may have this screening every year starting at age 6 if you have a 30-pack-year history of smoking and currently smoke or have quit within the past 15 years. Fecal occult blood test (FOBT) of the stool. You may have this test every year starting at age 66. Flexible sigmoidoscopy or colonoscopy. You may have a sigmoidoscopy every 5 years or a colonoscopy every 10 years starting at age 2. Prostate cancer screening. Recommendations will vary depending on your family history and other risks. Hepatitis C blood test. Hepatitis B blood test. Sexually transmitted disease (STD) testing. Diabetes screening. This is done by checking your blood sugar (glucose) after you have not eaten for a while (fasting). You may have this done every 1-3 years. Abdominal aortic aneurysm (AAA) screening. You may need this if you are a current or former smoker. Osteoporosis. You may be screened starting at age 87 if you  are at high risk. Talk with your health care provider about your test results, treatment options, and if necessary, the need for more tests. Vaccines  Your health care provider may recommend certain vaccines, such as: Influenza vaccine. This is recommended every year. Tetanus, diphtheria, and acellular pertussis (Tdap, Td) vaccine. You may need a Td booster every 10 years. Zoster  vaccine. You may need this after age 86. Pneumococcal 13-valent conjugate (PCV13) vaccine. One dose is recommended after age 47. Pneumococcal polysaccharide (PPSV23) vaccine. One dose is recommended after age 50. Talk to your health care provider about which screenings and vaccines you need and how often you need them. This information is not intended to replace advice given to you by your health care provider. Make sure you discuss any questions you have with your health care provider. Document Released: 09/18/2015 Document Revised: 05/11/2016 Document Reviewed: 06/23/2015 Elsevier Interactive Patient Education  2017 [place] Prevention in the Home Falls can cause injuries. They can happen to people of all ages. There are many things you can do to make your home safe and to help prevent falls. What can I do on the outside of my home? Regularly fix the edges of walkways and driveways and fix any cracks. Remove anything that might make you trip as you walk through a door, such as a raised step or threshold. Trim any bushes or trees on the path to your home. Use bright outdoor lighting. Clear any walking paths of anything that might make someone trip, such as rocks or tools. Regularly check to see if handrails are loose or broken. Make sure that both sides of any steps have handrails. Any raised decks and porches should have guardrails on the edges. Have any leaves, snow, or ice cleared regularly. Use sand or salt on walking paths during winter. Clean up any spills in your garage right away. This includes oil or grease spills. What can I do in the bathroom? Use night lights. Install grab bars by the toilet and in the tub and shower. Do not use towel bars as grab bars. Use non-skid mats or decals in the tub or shower. If you need to sit down in the shower, use a plastic, non-slip stool. Keep the floor dry. Clean up any water that spills on the floor as soon as it happens. Remove  soap buildup in the tub or shower regularly. Attach bath mats securely with double-sided non-slip rug tape. Do not have throw rugs and other things on the floor that can make you trip. What can I do in the bedroom? Use night lights. Make sure that you have a light by your bed that is easy to reach. Do not use any sheets or blankets that are too big for your bed. They should not hang down onto the floor. Have a firm chair that has side arms. You can use this for support while you get dressed. Do not have throw rugs and other things on the floor that can make you trip. What can I do in the kitchen? Clean up any spills right away. Avoid walking on wet floors. Keep items that you use a lot in easy-to-reach places. If you need to reach something above you, use a strong step stool that has a grab bar. Keep electrical cords out of the way. Do not use floor polish or wax that makes floors slippery. If you must use wax, use non-skid floor wax. Do not have throw rugs and other things on the  floor that can make you trip. What can I do with my stairs? Do not leave any items on the stairs. Make sure that there are handrails on both sides of the stairs and use them. Fix handrails that are broken or loose. Make sure that handrails are as long as the stairways. Check any carpeting to make sure that it is firmly attached to the stairs. Fix any carpet that is loose or worn. Avoid having throw rugs at the top or bottom of the stairs. If you do have throw rugs, attach them to the floor with carpet tape. Make sure that you have a light switch at the top of the stairs and the bottom of the stairs. If you do not have them, ask someone to add them for you. What else can I do to help prevent falls? Wear shoes that: Do not have high heels. Have rubber bottoms. Are comfortable and fit you well. Are closed at the toe. Do not wear sandals. If you use a stepladder: Make sure that it is fully opened. Do not climb a  closed stepladder. Make sure that both sides of the stepladder are locked into place. Ask someone to hold it for you, if possible. Clearly mark and make sure that you can see: Any grab bars or handrails. First and last steps. Where the edge of each step is. Use tools that help you move around (mobility aids) if they are needed. These include: Canes. Walkers. Scooters. Crutches. Turn on the lights when you go into a dark area. Replace any light bulbs as soon as they burn out. Set up your furniture so you have a clear path. Avoid moving your furniture around. If any of your floors are uneven, fix them. If there are any pets around you, be aware of where they are. Review your medicines with your doctor. Some medicines can make you feel dizzy. This can increase your chance of falling. Ask your doctor what other things that you can do to help prevent falls. This information is not intended to replace advice given to you by your health care provider. Make sure you discuss any questions you have with your health care provider. Document Released: 06/18/2009 Document Revised: 01/28/2016 Document Reviewed: 09/26/2014 Elsevier Interactive Patient Education  2017 [employer].

## 2021-03-20 ENCOUNTER — Other Ambulatory Visit: Payer: Self-pay | Admitting: Internal Medicine

## 2021-03-22 ENCOUNTER — Other Ambulatory Visit: Payer: Self-pay | Admitting: Internal Medicine

## 2021-04-09 ENCOUNTER — Other Ambulatory Visit: Payer: Self-pay

## 2021-04-09 ENCOUNTER — Ambulatory Visit (INDEPENDENT_AMBULATORY_CARE_PROVIDER_SITE_OTHER): Payer: Medicare Other

## 2021-04-09 DIAGNOSIS — E538 Deficiency of other specified B group vitamins: Secondary | ICD-10-CM

## 2021-04-09 MED ORDER — CYANOCOBALAMIN 1000 MCG/ML IJ SOLN
1000.0000 ug | Freq: Once | INTRAMUSCULAR | Status: AC
  Administered 2021-04-09: 1000 ug via INTRAMUSCULAR

## 2021-04-09 NOTE — Progress Notes (Signed)
Patient presented for B 12 injection to right deltoid, patient voiced no concerns nor showed any signs of distress during injection. 

## 2021-05-03 DIAGNOSIS — H43813 Vitreous degeneration, bilateral: Secondary | ICD-10-CM | POA: Diagnosis not present

## 2021-05-14 ENCOUNTER — Other Ambulatory Visit: Payer: Self-pay

## 2021-05-14 ENCOUNTER — Ambulatory Visit (INDEPENDENT_AMBULATORY_CARE_PROVIDER_SITE_OTHER): Payer: Medicare Other

## 2021-05-14 DIAGNOSIS — E538 Deficiency of other specified B group vitamins: Secondary | ICD-10-CM

## 2021-05-14 MED ORDER — CYANOCOBALAMIN 1000 MCG/ML IJ SOLN
1000.0000 ug | Freq: Once | INTRAMUSCULAR | Status: AC
  Administered 2021-05-14: 1000 ug via INTRAMUSCULAR

## 2021-05-14 NOTE — Progress Notes (Signed)
Patient presented for B 12 injection to left deltoid, patient voiced no concerns nor showed any signs of distress during injection. 

## 2021-05-28 ENCOUNTER — Other Ambulatory Visit: Payer: Self-pay | Admitting: Internal Medicine

## 2021-06-15 ENCOUNTER — Ambulatory Visit: Payer: Medicare Other

## 2021-07-09 ENCOUNTER — Other Ambulatory Visit: Payer: Self-pay

## 2021-07-09 ENCOUNTER — Ambulatory Visit
Admission: EM | Admit: 2021-07-09 | Discharge: 2021-07-09 | Disposition: A | Discharge status: Home / Self Care | Payer: Medicare Other | Attending: Emergency Medicine | Admitting: Emergency Medicine

## 2021-07-09 ENCOUNTER — Encounter: Payer: Self-pay | Admitting: Emergency Medicine

## 2021-07-09 DIAGNOSIS — J069 Acute upper respiratory infection, unspecified: Secondary | ICD-10-CM

## 2021-07-09 MED ORDER — DOXYCYCLINE HYCLATE 100 MG PO CAPS: 100.0000 mg | ORAL_CAPSULE | Freq: Two times a day (BID) | ORAL | 0 refills | Status: AC

## 2021-07-09 MED ORDER — PROMETHAZINE-DM 6.25-15 MG/5ML PO SYRP: 5.0000 mL | ORAL_SOLUTION | Freq: Four times a day (QID) | ORAL | 0 refills | Status: DC | PRN

## 2021-07-09 MED ORDER — IPRATROPIUM BROMIDE 0.06 % NA SOLN: 2.0000 | Freq: Four times a day (QID) | NASAL | 12 refills | Status: DC

## 2021-07-09 MED ORDER — BENZONATATE 100 MG PO CAPS: 200.0000 mg | ORAL_CAPSULE | Freq: Three times a day (TID) | ORAL | 0 refills | Status: DC

## 2021-07-09 NOTE — ED Provider Notes (Signed)
[facility]    CSN: [id number] Arrival date & time: 07/09/21  0903      History   Chief Complaint Chief Complaint  Patient presents with   Cough   Nasal Congestion    HPI Austin Duran is a 75 y.o. male.   HPI  75 year old male here for evaluation of respiratory complaints.  Patient reports that he has been experiencing runny nose with clear nasal discharge, postnasal drip, cough instructed for brown sputum, and sneezing for the past week or more.  He denies any fever, shortness breath or wheezing, ear pain, sore throat, or GI complaints.  He was exposed to his granddaughter who is influenza positive.  Past Medical History:  Diagnosis Date   Depression    Hearing loss    had a perforated right TM,   followed by Dr. Tami Ribas   Hypertension    Panic attacks    PVC's (premature ventricular contractions)     Patient Active Problem List   Diagnosis Date Noted   COVID-19 virus infection 09/26/2019   SVT (supraventricular tachycardia) ([place]) 07/03/2018   Insomnia 07/03/2018   Exertional dyspnea 02/06/2017   Encounter for preventive health examination 07/18/2015   OSA (obstructive sleep apnea) 07/13/2014   B12 deficiency 07/10/2014   Cerumen impaction 07/10/2014   Vitamin D deficiency 07/03/2013   Enlarged prostate without lower urinary tract symptoms (luts) 07/02/2013   Bilateral neuropathy of upper extremities 07/02/2013   Hypertension 07/06/2012   Basal cell carcinoma of back 01/02/2012   Hearing loss, sensorineural, asymmetrical 01/02/2012   S/P bilateral cataract extraction 01/02/2012   Encounter for Medicare annual wellness exam 01/02/2012   Obesity (BMI 30-39.9) 01/02/2012   Screening for colon cancer 08/07/2011   Screening for prostate cancer 08/07/2011   Dyslipidemia with elevated low density lipoprotein cholesterol and abnormally low high density lipoprotein cholesterol 08/07/2011   Lumbago 08/07/2011    Past Surgical History:  Procedure  Laterality Date   CATARACT EXTRACTION Bilateral Aug 2013   [facility]   CATARACT EXTRACTION, BILATERAL  May 2013   [place]   EYE SURGERY  2008   eyelid surgery, Dr. Loletta Specter   FRACTURE SURGERY     TYMPANIC MEMBRANE REPAIR      [facility] , right        Home Medications    Prior to Admission medications   Medication Sig Start Date End Date Taking? Authorizing Provider  benzonatate (TESSALON) 100 MG capsule Take 2 capsules (200 mg total) by mouth every 8 (eight) hours. 07/09/21  Yes Margarette Canada, NP  Cholecalciferol (VITAMIN D-3 PO) Take 1 tablet by mouth daily.   Yes [provider]  citalopram (CELEXA) 20 MG tablet TAKE 1 TABLET(20 MG) BY MOUTH DAILY 12/07/20  Yes [name], MD  diltiazem (CARDIZEM CD) 120 MG 24 hr capsule TAKE 1 CAPSULE(120 MG) BY MOUTH DAILY 11/30/20  Yes [name], MD  doxycycline (VIBRAMYCIN) 100 MG capsule Take 1 capsule (100 mg total) by mouth 2 (two) times daily for 7 days. 07/09/21 07/16/21 Yes Margarette Canada, NP  ipratropium (ATROVENT) 0.06 % nasal spray Place 2 sprays into both nostrils 4 (four) times daily. 07/09/21  Yes Margarette Canada, NP  losartan-hydrochlorothiazide (HYZAAR) 50-12.5 MG tablet TAKE 1 TABLET BY MOUTH DAILY 05/31/21  Yes [name], MD  promethazine-dextromethorphan (PROMETHAZINE-DM) 6.25-15 MG/5ML syrup Take 5 mLs by mouth 4 (four) times daily as needed. 07/09/21  Yes Margarette Canada, NP  furosemide (LASIX) 20 MG tablet TAKE 1 TABLET BY MOUTH  1 TO 2 TIMES WEEKLY AS NEEDED FOR INCREASE SHORTNESS OF BREATH/ SWELLING 03/22/21   Deboraha Sprang, MD    Family History Family History  Problem Relation Age of Onset   Cancer Maternal Aunt        lung   Cancer Paternal Uncle        colon    Social History Social History   Tobacco Use   Smoking status: Former    Types: Cigarettes    Quit date: 08/03/2004    Years since quitting: 16.9   Smokeless tobacco: Never  Vaping Use   Vaping Use: Never used  Substance Use Topics    Alcohol use: Yes    Alcohol/week: 5.0 standard drinks    Types: 5 Standard drinks or equivalent per week   Drug use: No     Allergies   Augmentin [amoxicillin-pot clavulanate] and Lisinopril   Review of Systems Review of Systems  Constitutional:  Negative for activity change, appetite change and fever.  HENT:  Positive for congestion, postnasal drip and rhinorrhea. Negative for ear pain and sore throat.   Respiratory:  Positive for cough. Negative for shortness of breath and wheezing.   Gastrointestinal:  Negative for diarrhea, nausea and vomiting.  Skin:  Negative for rash.  Hematological: Negative.   Psychiatric/Behavioral: Negative.      Physical Exam Triage Vital Signs ED Triage Vitals  Enc Vitals Group     BP 07/09/21 0931 (!) 153/73     Pulse Rate 07/09/21 0931 73     Resp 07/09/21 0931 16     Temp 07/09/21 0931 98 F (36.7 C)     Temp Source 07/09/21 0931 Oral     SpO2 07/09/21 0931 96 %     Weight 07/09/21 0928 213 lb (96.6 kg)     Height 07/09/21 0928 5\' 9"  (1.753 m)     Head Circumference --      Peak Flow --      Pain Score 07/09/21 0928 2     Pain Loc --      Pain Edu? --      Excl. in [place]? --    No data found.  Updated Vital Signs BP (!) 153/73 (BP Location: Left Arm)   Pulse 73   Temp 98 F (36.7 C) (Oral)   Resp 16   Ht 5\' 9"  (1.753 m)   Wt 213 lb (96.6 kg)   SpO2 96%   BMI 31.45 kg/m   Visual Acuity Right Eye Distance:   Left Eye Distance:   Bilateral Distance:    Right Eye Near:   Left Eye Near:    Bilateral Near:     Physical Exam Vitals and nursing note reviewed.  Constitutional:      General: He is not in acute distress.    Appearance: Normal appearance. He is not ill-appearing.  HENT:     Head: Normocephalic and atraumatic.     Right Ear: Tympanic membrane, ear canal and external ear normal.     Nose: Congestion and rhinorrhea present.     Mouth/Throat:     Mouth: Mucous membranes are moist.     Pharynx: Posterior  oropharyngeal erythema present.  Cardiovascular:     Rate and Rhythm: Normal rate and regular rhythm.     Pulses: Normal pulses.     Heart sounds: Normal heart sounds. No murmur heard.   No gallop.  Pulmonary:     Effort: Pulmonary effort is normal.  Breath sounds: Normal breath sounds. No wheezing, rhonchi or rales.  Musculoskeletal:     Cervical back: Normal range of motion and neck supple.  Lymphadenopathy:     Cervical: No cervical adenopathy.  Skin:    General: Skin is warm and dry.     Capillary Refill: Capillary refill takes less than 2 seconds.     Findings: No erythema or rash.  Neurological:     General: No focal deficit present.     Mental Status: He is alert and oriented to person, place, and time.  Psychiatric:        Mood and Affect: Mood normal.        Behavior: Behavior normal.        Thought Content: Thought content normal.        Judgment: Judgment normal.     UC Treatments / Results  Labs (all labs ordered are listed, but only abnormal results are displayed) Labs Reviewed - No data to display  EKG   Radiology No results found.  Procedures Procedures (including critical care time)  Medications Ordered in UC Medications - No data to display  Initial Impression / Assessment and Plan / UC Course  I have reviewed the triage vital signs and the nursing notes.  Pertinent labs & imaging results that were available during my care of the patient were reviewed by me and considered in my medical decision making (see chart for details).  Patient very pleasant, nontoxic-appearing 75 year old male here for evaluation of respiratory complaints as outlined in HPI above.  Patient's physical exam reveals pearly gray tympanic membranes bilaterally with normal light reflex and clear external auditory canals.  Nasal mucosa is erythematous and edematous with clear nasal discharge in both nares.  Oropharyngeal exam reveals mild posterior oropharyngeal erythema and  cobblestoning with clear postnasal drip.  No cervical lymphadenopathy appreciated on exam.  Cardiopulmonary exam reveals clear lung sounds in all fields.  Due to the extended duration of patient's symptoms combined with his history of obstructive sleep apnea we will do a trial of antibiotics.  Will place patient on doxycycline twice daily for 7 days to cover potential bacterial secondary infections, Atrovent nasal spray to have the nasal congestion, and Tessalon Perles about the symptom concept of cough and congestion.  ER and return precautions reviewed with patient.   Final Clinical Impressions(s) / UC Diagnoses   Final diagnoses:  Upper respiratory tract infection, unspecified type     Discharge Instructions      Take the Doxycycline twice daily for 7 days for treatment of your URI.   Use the Atrovent nasal spray, 2 squirts in each nostril every 6 hours, as needed for runny nose and postnasal drip.  Use the Tessalon Perles every 8 hours during the day.  Take them with a small sip of water.  They may give you some numbness to the base of your tongue or a metallic taste in your mouth, this is normal.  Use the Promethazine DM cough syrup at bedtime for cough and congestion.  It will make you drowsy so do not take it during the day.  Return for reevaluation or see your primary care provider for any new or worsening symptoms.      ED Prescriptions     Medication Sig Dispense Auth. Provider   doxycycline (VIBRAMYCIN) 100 MG capsule Take 1 capsule (100 mg total) by mouth 2 (two) times daily for 7 days. 14 capsule Margarette Canada, NP   benzonatate (TESSALON) 100 MG capsule  Take 2 capsules (200 mg total) by mouth every 8 (eight) hours. 21 capsule Margarette Canada, NP   ipratropium (ATROVENT) 0.06 % nasal spray Place 2 sprays into both nostrils 4 (four) times daily. 15 mL Margarette Canada, NP   promethazine-dextromethorphan (PROMETHAZINE-DM) 6.25-15 MG/5ML syrup Take 5 mLs by mouth 4 (four) times daily  as needed. 118 mL Margarette Canada, NP      PDMP not reviewed this encounter.   Margarette Canada, NP 07/09/21 1027

## 2021-07-09 NOTE — Discharge Instructions (Signed)
Take the Doxycycline twice daily for 7 days for treatment of your URI.   Use the Atrovent nasal spray, 2 squirts in each nostril every 6 hours, as needed for runny nose and postnasal drip.  Use the Tessalon Perles every 8 hours during the day.  Take them with a small sip of water.  They may give you some numbness to the base of your tongue or a metallic taste in your mouth, this is normal.  Use the Promethazine DM cough syrup at bedtime for cough and congestion.  It will make you drowsy so do not take it during the day.  Return for reevaluation or see your primary care provider for any new or worsening symptoms.

## 2021-07-09 NOTE — ED Triage Notes (Signed)
Patient c/o cough, sneezing, and runny nose that started a week ago.  Patient has not checked his temperature at home.

## 2021-07-09 NOTE — ED Triage Notes (Signed)
Patient states that he has been exposed to flu by his granddaughter.

## 2021-07-21 ENCOUNTER — Ambulatory Visit (INDEPENDENT_AMBULATORY_CARE_PROVIDER_SITE_OTHER): Payer: Medicare Other

## 2021-07-21 ENCOUNTER — Other Ambulatory Visit: Payer: Self-pay

## 2021-07-21 DIAGNOSIS — E538 Deficiency of other specified B group vitamins: Secondary | ICD-10-CM

## 2021-07-21 DIAGNOSIS — Z23 Encounter for immunization: Secondary | ICD-10-CM | POA: Diagnosis not present

## 2021-07-21 MED ORDER — CYANOCOBALAMIN 1000 MCG/ML IJ SOLN
1000.0000 ug | Freq: Once | INTRAMUSCULAR | Status: AC
  Administered 2021-07-21: 1000 ug via INTRAMUSCULAR

## 2021-07-21 NOTE — Progress Notes (Signed)
Patient presented for B 12 injection to right deltoid, patient voiced no concerns nor showed any signs of distress during injection. 

## 2021-08-10 ENCOUNTER — Encounter: Payer: Self-pay | Admitting: Internal Medicine

## 2021-08-10 ENCOUNTER — Other Ambulatory Visit: Payer: Self-pay

## 2021-08-10 ENCOUNTER — Ambulatory Visit (INDEPENDENT_AMBULATORY_CARE_PROVIDER_SITE_OTHER): Payer: Medicare Other | Admitting: Internal Medicine

## 2021-08-10 VITALS — BP 140/78 | HR 64 | Ht 69.0 in | Wt 211.0 lb

## 2021-08-10 DIAGNOSIS — I493 Ventricular premature depolarization: Secondary | ICD-10-CM | POA: Diagnosis not present

## 2021-08-10 DIAGNOSIS — I119 Hypertensive heart disease without heart failure: Secondary | ICD-10-CM | POA: Diagnosis not present

## 2021-08-10 NOTE — Progress Notes (Signed)
      Patient Care Team: [name], MD as PCP - General (Internal Medicine)   HPI  Austin Duran is a 75 y.o. male Seen in follow-up for PVCs and atrial fib detected on recent event recorder    1/22 complains of dyspnea on exertion.  Myoview as below negative  The patient denies chest pain, nocturnal dyspnea, orthopnea.  There have been no palpitations, lightheadedness or syncope.  Complains of predictable and stable dyspnea with exertion and intermittent dependent edema     Salt intake replete: ((  eats out daily  trying to lose weight      DATE TEST EF   7/18 Echo   55-65   % LVH mild  1/22  MYOVIEW 55-65% Normal Perfusion   Date Cr K Hgb  12//18 0.98 4.1   10/19  1.17 4.2   2/21 1.1 3.9 17.4    Thromboembolic risk factors ( age -1, HTN-1) for a CHADSVASc Score of 2  Dx with B12 deficiency + IF  Records and Results Reviewed   Past Medical History:  Diagnosis Date   Depression    Hearing loss    had a perforated right TM,   followed by Dr. Tami Ribas   Hypertension    Panic attacks    PVC's (premature ventricular contractions)     Past Surgical History:  Procedure Laterality Date   CATARACT EXTRACTION Bilateral Aug 2013   [facility]   CATARACT EXTRACTION, BILATERAL  May 2013   [facility]   EYE SURGERY  2008   eyelid surgery, Dr. Loletta Specter   FRACTURE SURGERY     TYMPANIC MEMBRANE REPAIR     [facility] , right     Current Meds  Medication Sig   benzonatate (TESSALON) 100 MG capsule Take 2 capsules (200 mg total) by mouth every 8 (eight) hours.   Cholecalciferol (VITAMIN D-3 PO) Take 1 tablet by mouth daily.   citalopram (CELEXA) 20 MG tablet TAKE 1 TABLET(20 MG) BY MOUTH DAILY   diltiazem (CARDIZEM CD) 120 MG 24 hr capsule TAKE 1 CAPSULE(120 MG) BY MOUTH DAILY   losartan-hydrochlorothiazide (HYZAAR) 50-12.5 MG tablet TAKE 1 TABLET BY MOUTH DAILY   meloxicam (MOBIC) 15 MG tablet Take 15 mg by mouth daily.    Allergies  Allergen Reactions    Augmentin [Amoxicillin-Pot Clavulanate] Nausea Only   Lisinopril Cough      Review of Systems negative except from HPI and PMH  Physical Exam BP 140/78 (BP Location: Left Arm, Patient Position: Sitting, Cuff Size: Normal)   Pulse 64   Ht 5\' 9"  (1.753 m)   Wt 211 lb (95.7 kg)   SpO2 97%   BMI 31.16 kg/m  Well developed and nourished in no acute distress HENT normal Neck supple with JVP-  flat   Clear Regular rate and rhythm, no murmurs or gallops Abd-soft with active BS No Clubbing cyanosis edema Skin-warm and dry A & Oriented  Grossly normal sensory and motor function  ECG sinus @ 64 16/09/41   Assessment and  Plan  Dyspnea on exertion  PVCs-RVOT-exercise associated   Hypertension with hypertensive heart disease   Obesity-BMI greater than 30  Atrial Fib SCAF   No interval palpitations.  Blood pressure remains borderline elevated.  Encouraged decreasing sodium for blood pressure as well as edema.  continue Cardizem 120 and losartan HCT.  We will check potassium levels today.  No PVCs on ECG and no palpitations.  Continue Cardizem 120  Encouraged weight loss.

## 2021-08-10 NOTE — Patient Instructions (Addendum)
Medication Instructions:  - Your physician recommends that you continue on your current medications as directed. Please refer to the Current Medication list given to you today.  *If you need a refill on your cardiac medications before your next appointment, please call your pharmacy*   Lab Work: - Your physician recommends that you have lab work: BMP   If you have labs (blood work) drawn today and your tests are completely normal, you will receive your results only by: MyChart Message (if you have MyChart) OR A paper copy in the mail If you have any lab test that is abnormal or we need to change your treatment, we will call you to review the results.   Testing/Procedures: - none ordered    Follow-Up: At [facility], you and your health needs are our priority.  As part of our continuing mission to provide you with exceptional heart care, we have created designated Provider Care Teams.  These Care Teams include your primary Cardiologist (physician) and Advanced Practice Providers (APPs -  Physician Assistants and Nurse Practitioners) who all work together to provide you with the care you need, when you need it.  We recommend signing up for the patient portal called "MyChart".  Sign up information is provided on this After Visit Summary.  MyChart is used to connect with patients for Virtual Visits (Telemedicine).  Patients are able to view lab/test results, encounter notes, upcoming appointments, etc.  Non-urgent messages can be sent to your provider as well.   To learn more about what you can do with MyChart, go to [email or web address].    Your next appointment:   1 year(s)  The format for your next appointment:   In Person  Provider:   Virl Axe, MD    Other Instructions N/a 4

## 2021-08-11 LAB — BASIC METABOLIC PANEL
BUN/Creatinine Ratio: 18 (ref 10–24)
BUN: 20 mg/dL (ref 8–27)
CO2: 24 mmol/L (ref 20–29)
Calcium: 9.9 mg/dL (ref 8.6–10.2)
Chloride: 103 mmol/L (ref 96–106)
Creatinine, Ser: 1.09 mg/dL (ref 0.76–1.27)
Glucose: 94 mg/dL (ref 70–99)
Potassium: 4.3 mmol/L (ref 3.5–5.2)
Sodium: 141 mmol/L (ref 134–144)
eGFR: 71 mL/min/{1.73_m2} (ref >59)

## 2021-08-14 ENCOUNTER — Other Ambulatory Visit: Payer: Self-pay

## 2021-08-14 ENCOUNTER — Ambulatory Visit
Admission: RE | Admit: 2021-08-14 | Discharge: 2021-08-14 | Disposition: A | Discharge status: Home / Self Care | Payer: Medicare Other | Source: Ambulatory Visit | Attending: Family Medicine | Admitting: Family Medicine

## 2021-08-14 VITALS — BP 123/62 | HR 73 | Temp 98.0°F | Resp 15 | Ht 69.0 in | Wt 210.0 lb

## 2021-08-14 DIAGNOSIS — U071 COVID-19: Secondary | ICD-10-CM | POA: Insufficient documentation

## 2021-08-14 DIAGNOSIS — R52 Pain, unspecified: Secondary | ICD-10-CM | POA: Insufficient documentation

## 2021-08-14 DIAGNOSIS — R051 Acute cough: Secondary | ICD-10-CM | POA: Insufficient documentation

## 2021-08-14 LAB — RESP PANEL BY RT-PCR (FLU A&B, COVID) ARPGX2
Influenza A by PCR: NEGATIVE
Influenza B by PCR: NEGATIVE
SARS Coronavirus 2 by RT PCR: POSITIVE — AB

## 2021-08-14 MED ORDER — MOLNUPIRAVIR EUA 200MG CAPSULE: 4.0000 | ORAL_CAPSULE | Freq: Two times a day (BID) | ORAL | 0 refills | Status: AC

## 2021-08-14 NOTE — ED Triage Notes (Signed)
Patient c/o cough, congestion and bodyaches that started on Thursday.  Patient denies fevers.

## 2021-08-14 NOTE — Discharge Instructions (Addendum)
+  COVID TEST. You need to isolate 3 more days and then mask x 5 days   If symptomatic, go home and rest. Push fluids. Take Tylenol as needed for discomfort. Gargle warm salt water. Throat lozenges. Take Coricidin HBP for cough. Humidifier in bedroom to ease coughing. Warm showers. Also review the COVID handout for more information.  COVID-19 INFECTION: The incubation period of COVID-19 is approximately 14 days after exposure, with most symptoms developing in roughly 4-5 days. Symptoms may range in severity from mild to critically severe. Roughly 80% of those infected will have mild symptoms. People of any age may become infected with COVID-19 and have the ability to transmit the virus. The most common symptoms include: fever, fatigue, cough, body aches, headaches, sore throat, nasal congestion, shortness of breath, nausea, vomiting, diarrhea, changes in smell and/or taste.    COURSE OF ILLNESS Some patients may begin with mild disease which can progress quickly into critical symptoms. If your symptoms are worsening please call ahead to the Emergency Department and proceed there for further treatment. Recovery time appears to be roughly 1-2 weeks for mild symptoms and 3-6 weeks for severe disease.   GO IMMEDIATELY TO ER FOR FEVER YOU ARE UNABLE TO GET DOWN WITH TYLENOL, BREATHING PROBLEMS, CHEST PAIN, FATIGUE, LETHARGY, INABILITY TO EAT OR DRINK, ETC  QUARANTINE AND ISOLATION: To help decrease the spread of COVID-19 please remain isolated if you have COVID infection or are highly suspected to have COVID infection. This means -stay home and isolate to one room in the home if you live with others. Do not share a bed or bathroom with others while ill, sanitize and wipe down all countertops and keep common areas clean and disinfected. Stay home for 5 days. If you have no symptoms or your symptoms are resolving after 5 days, you can leave your house. Continue to wear a mask around others for 5 additional  days. If you have been in close contact (within 6 feet) of someone diagnosed with COVID 19, you are advised to quarantine in your home for 14 days as symptoms can develop anywhere from 2-14 days after exposure to the virus. If you develop symptoms, you  must isolate.  Most current guidelines for COVID after exposure -unvaccinated: isolate 5 days and strict mask use x 5 days. Test on day 5 is possible -vaccinated: wear mask x 10 days if symptoms do not develop -You do not necessarily need to be tested for COVID if you have + exposure and  develop symptoms. Just isolate at home x10 days from symptom onset During this global pandemic, CDC advises to practice social distancing, try to stay at least 52ft away from others at all times. Wear a face covering. Wash and sanitize your hands regularly and avoid going anywhere that is not necessary.  KEEP IN MIND THAT THE COVID TEST IS NOT 100% ACCURATE AND YOU SHOULD STILL DO EVERYTHING TO PREVENT POTENTIAL SPREAD OF VIRUS TO OTHERS (WEAR MASK, WEAR GLOVES, [place] HANDS AND SANITIZE REGULARLY). IF INITIAL TEST IS NEGATIVE, THIS MAY NOT MEAN YOU ARE DEFINITELY NEGATIVE. MOST ACCURATE TESTING IS DONE 5-7 DAYS AFTER EXPOSURE.   It is not advised by CDC to get re-tested after receiving a positive COVID test since you can still test positive for weeks to months after you have already cleared the virus.   *If you have not been vaccinated for COVID, I strongly suggest you consider getting vaccinated as long as there are no contraindications.

## 2021-08-14 NOTE — ED Provider Notes (Signed)
[facility]    CSN: [id number] Arrival date & time: 08/14/21  0849      History   Chief Complaint Chief Complaint  Patient presents with   Appointment   Cough   Generalized Body Aches    HPI Austin Duran is a 75 y.o. male presenting for 3-day history of nasal congestion, cough and body aches.  Patient denies any associated fevers.  Has been mildly fatigued.  No sore throat, chest pain, difficulty, vomiting or diarrhea.  Patient says he was sick last month with similar symptoms and was treated with an antibiotic for upper respiratory infection.  Took doxycycline at that time.  Patient reports that his wife has been sick for about a month with the same symptoms.  He says he had recovered until his symptoms have started back a couple days ago.  Patient has taken over-the-counter Coricidin HBP but none today.  No known COVID or flu exposure.  Not vaccinated for COVID-19.  No other complaints.  HPI  Past Medical History:  Diagnosis Date   Depression    Hearing loss    had a perforated right TM,   followed by Dr. Tami Ribas   Hypertension    Panic attacks    PVC's (premature ventricular contractions)     Patient Active Problem List   Diagnosis Date Noted   COVID-19 virus infection 09/26/2019   SVT (supraventricular tachycardia) ([place]) 07/03/2018   Insomnia 07/03/2018   Exertional dyspnea 02/06/2017   Encounter for preventive health examination 07/18/2015   OSA (obstructive sleep apnea) 07/13/2014   B12 deficiency 07/10/2014   Cerumen impaction 07/10/2014   Vitamin D deficiency 07/03/2013   Enlarged prostate without lower urinary tract symptoms (luts) 07/02/2013   Bilateral neuropathy of upper extremities 07/02/2013   Hypertension 07/06/2012   Basal cell carcinoma of back 01/02/2012   Hearing loss, sensorineural, asymmetrical 01/02/2012   S/P bilateral cataract extraction 01/02/2012   Encounter for Medicare annual wellness exam 01/02/2012   Obesity (BMI 30-39.9)  01/02/2012   Screening for colon cancer 08/07/2011   Screening for prostate cancer 08/07/2011   Dyslipidemia with elevated low density lipoprotein cholesterol and abnormally low high density lipoprotein cholesterol 08/07/2011   Lumbago 08/07/2011    Past Surgical History:  Procedure Laterality Date   CATARACT EXTRACTION Bilateral Aug 2013   [place]   CATARACT EXTRACTION, BILATERAL  May 2013   [place]   EYE SURGERY  2008   eyelid surgery, Dr. Loletta Specter   FRACTURE SURGERY     TYMPANIC MEMBRANE REPAIR     [facility] , right        Home Medications    Prior to Admission medications   Medication Sig Start Date End Date Taking? Authorizing Provider  Cholecalciferol (VITAMIN D-3 PO) Take 1 tablet by mouth daily.   Yes [provider]  citalopram (CELEXA) 20 MG tablet TAKE 1 TABLET(20 MG) BY MOUTH DAILY 12/07/20  Yes [name], MD  diltiazem (CARDIZEM CD) 120 MG 24 hr capsule TAKE 1 CAPSULE(120 MG) BY MOUTH DAILY 11/30/20  Yes [name], MD  furosemide (LASIX) 20 MG tablet TAKE 1 TABLET BY MOUTH 1 TO 2 TIMES WEEKLY AS NEEDED FOR INCREASE SHORTNESS OF BREATH/ SWELLING 03/22/21  Yes Deboraha Sprang, MD  losartan-hydrochlorothiazide (HYZAAR) 50-12.5 MG tablet TAKE 1 TABLET BY MOUTH DAILY 05/31/21  Yes [name], MD  meloxicam (MOBIC) 15 MG tablet Take 15 mg by mouth daily. 06/17/21  Yes [provider]  molnupiravir EUA (  LAGEVRIO) 200 mg CAPS capsule Take 4 capsules (800 mg total) by mouth 2 (two) times daily for 5 days. 08/14/21 08/19/21 Yes Danton Clap, PA-C    Family History Family History  Problem Relation Age of Onset   Cancer Maternal Aunt        lung   Cancer Paternal Uncle        colon    Social History Social History   Tobacco Use   Smoking status: Former    Types: Cigarettes    Quit date: 08/03/2004    Years since quitting: 17.0   Smokeless tobacco: Never  Vaping Use   Vaping Use: Never used  Substance Use Topics   Alcohol  use: Yes    Alcohol/week: 5.0 standard drinks    Types: 5 Standard drinks or equivalent per week   Drug use: No     Allergies   Augmentin [amoxicillin-pot clavulanate] and Lisinopril   Review of Systems Review of Systems  Constitutional:  Positive for fatigue. Negative for fever.  HENT:  Positive for congestion, postnasal drip and rhinorrhea. Negative for sinus pressure, sinus pain and sore throat.   Respiratory:  Positive for cough. Negative for shortness of breath.   Gastrointestinal:  Negative for abdominal pain, diarrhea, nausea and vomiting.  Musculoskeletal:  Positive for myalgias.  Neurological:  Negative for weakness, light-headedness and headaches.  Hematological:  Negative for adenopathy.    Physical Exam Triage Vital Signs ED Triage Vitals  Enc Vitals Group     BP 08/14/21 0903 123/62     Pulse Rate 08/14/21 0903 73     Resp 08/14/21 0903 15     Temp 08/14/21 0903 98 F (36.7 C)     Temp Source 08/14/21 0903 Oral     SpO2 08/14/21 0903 97 %     Weight 08/14/21 0859 210 lb (95.3 kg)     Height 08/14/21 0859 5\' 9"  (1.753 m)     Head Circumference --      Peak Flow --      Pain Score 08/14/21 0859 4     Pain Loc --      Pain Edu? --      Excl. in [place]? --    No data found.  Updated Vital Signs BP 123/62 (BP Location: Left Arm)   Pulse 73   Temp 98 F (36.7 C) (Oral)   Resp 15   Ht 5\' 9"  (1.753 m)   Wt 210 lb (95.3 kg)   SpO2 97%   BMI 31.01 kg/m      Physical Exam Vitals and nursing note reviewed.  Constitutional:      General: He is not in acute distress.    Appearance: Normal appearance. He is well-developed. He is not ill-appearing.  HENT:     Head: Normocephalic and atraumatic.     Nose: Congestion and rhinorrhea present.     Mouth/Throat:     Mouth: Mucous membranes are moist.     Pharynx: Oropharynx is clear.  Eyes:     General: No scleral icterus.    Conjunctiva/sclera: Conjunctivae normal.  Cardiovascular:     Rate and Rhythm:  Normal rate and regular rhythm.     Heart sounds: Normal heart sounds.  Pulmonary:     Effort: Pulmonary effort is normal. No respiratory distress.     Breath sounds: Normal breath sounds.  Musculoskeletal:     Cervical back: Neck supple.  Skin:    General: Skin is warm and dry.  Capillary Refill: Capillary refill takes less than 2 seconds.  Neurological:     General: No focal deficit present.     Mental Status: He is alert. Mental status is at baseline.     Motor: No weakness.     Coordination: Coordination normal.     Gait: Gait normal.  Psychiatric:        Mood and Affect: Mood normal.        Behavior: Behavior normal.        Thought Content: Thought content normal.     UC Treatments / Results  Labs (all labs ordered are listed, but only abnormal results are displayed) Labs Reviewed  RESP PANEL BY RT-PCR (FLU A&B, COVID) ARPGX2 - Abnormal; Notable for the following components:      Result Value   SARS Coronavirus 2 by RT PCR POSITIVE (*)    All other components within normal limits    EKG   Radiology No results found.  Procedures Procedures (including critical care time)  Medications Ordered in UC Medications - No data to display  Initial Impression / Assessment and Plan / UC Course  I have reviewed the triage vital signs and the nursing notes.  Pertinent labs & imaging results that were available during my care of the patient were reviewed by me and considered in my medical decision making (see chart for details).  75 year old male presenting for 3-day history of cough, congestion, fatigue and body aches.  No fevers, chest pain, breathing difficulty.  Patient is overall well-appearing.  Vitals all normal and stable.  On exam he has mild nasal congestion and clear rhinorrhea.  Chest clear to auscultation heart regular rate and rhythm.  Respiratory panel obtained and is positive for COVID-19.  Discussed result with patient.  Reviewed current CDC guidelines,  isolation protocol and ED precautions with patient.  Patient is candidate for antiviral medication so sent Molnupiravir to pharmacy and advised him to continue with the Coricidin HBP, increasing rest and fluids.  Reviewed returning if he develops a fever, worsening cough, breathing difficulty, chest pain, etc.  Reviewed going to ED for any severe acute worsening of his symptoms, otherwise follow-up with PCP.   Final Clinical Impressions(s) / UC Diagnoses   Final diagnoses:  COVID-19  Acute cough  Body aches     Discharge Instructions      +COVID TEST. You need to isolate 3 more days and then mask x 5 days   If symptomatic, go home and rest. Push fluids. Take Tylenol as needed for discomfort. Gargle warm salt water. Throat lozenges. Take Coricidin HBP for cough. Humidifier in bedroom to ease coughing. Warm showers. Also review the COVID handout for more information.  COVID-19 INFECTION: The incubation period of COVID-19 is approximately 14 days after exposure, with most symptoms developing in roughly 4-5 days. Symptoms may range in severity from mild to critically severe. Roughly 80% of those infected will have mild symptoms. People of any age may become infected with COVID-19 and have the ability to transmit the virus. The most common symptoms include: fever, fatigue, cough, body aches, headaches, sore throat, nasal congestion, shortness of breath, nausea, vomiting, diarrhea, changes in smell and/or taste.    COURSE OF ILLNESS Some patients may begin with mild disease which can progress quickly into critical symptoms. If your symptoms are worsening please call ahead to the Emergency Department and proceed there for further treatment. Recovery time appears to be roughly 1-2 weeks for mild symptoms and 3-6 weeks for severe  disease.   GO IMMEDIATELY TO ER FOR FEVER YOU ARE UNABLE TO GET DOWN WITH TYLENOL, BREATHING PROBLEMS, CHEST PAIN, FATIGUE, LETHARGY, INABILITY TO EAT OR DRINK,  ETC  QUARANTINE AND ISOLATION: To help decrease the spread of COVID-19 please remain isolated if you have COVID infection or are highly suspected to have COVID infection. This means -stay home and isolate to one room in the home if you live with others. Do not share a bed or bathroom with others while ill, sanitize and wipe down all countertops and keep common areas clean and disinfected. Stay home for 5 days. If you have no symptoms or your symptoms are resolving after 5 days, you can leave your house. Continue to wear a mask around others for 5 additional days. If you have been in close contact (within 6 feet) of someone diagnosed with COVID 19, you are advised to quarantine in your home for 14 days as symptoms can develop anywhere from 2-14 days after exposure to the virus. If you develop symptoms, you  must isolate.  Most current guidelines for COVID after exposure -unvaccinated: isolate 5 days and strict mask use x 5 days. Test on day 5 is possible -vaccinated: wear mask x 10 days if symptoms do not develop -You do not necessarily need to be tested for COVID if you have + exposure and  develop symptoms. Just isolate at home x10 days from symptom onset During this global pandemic, CDC advises to practice social distancing, try to stay at least 54ft away from others at all times. Wear a face covering. Wash and sanitize your hands regularly and avoid going anywhere that is not necessary.  KEEP IN MIND THAT THE COVID TEST IS NOT 100% ACCURATE AND YOU SHOULD STILL DO EVERYTHING TO PREVENT POTENTIAL SPREAD OF VIRUS TO OTHERS (WEAR MASK, WEAR GLOVES, [place] HANDS AND SANITIZE REGULARLY). IF INITIAL TEST IS NEGATIVE, THIS MAY NOT MEAN YOU ARE DEFINITELY NEGATIVE. MOST ACCURATE TESTING IS DONE 5-7 DAYS AFTER EXPOSURE.   It is not advised by CDC to get re-tested after receiving a positive COVID test since you can still test positive for weeks to months after you have already cleared the virus.   *If you have  not been vaccinated for COVID, I strongly suggest you consider getting vaccinated as long as there are no contraindications.       ED Prescriptions     Medication Sig Dispense Auth. Provider   molnupiravir EUA (LAGEVRIO) 200 mg CAPS capsule Take 4 capsules (800 mg total) by mouth 2 (two) times daily for 5 days. 40 capsule Danton Clap, PA-C      PDMP not reviewed this encounter.   Danton Clap, PA-C 08/14/21 [phone]

## 2021-08-20 ENCOUNTER — Ambulatory Visit: Payer: Medicare Other

## 2021-08-27 ENCOUNTER — Ambulatory Visit (INDEPENDENT_AMBULATORY_CARE_PROVIDER_SITE_OTHER): Payer: Medicare Other

## 2021-08-27 ENCOUNTER — Other Ambulatory Visit: Payer: Self-pay

## 2021-08-27 DIAGNOSIS — E538 Deficiency of other specified B group vitamins: Secondary | ICD-10-CM

## 2021-08-27 MED ORDER — CYANOCOBALAMIN 1000 MCG/ML IJ SOLN
1000.0000 ug | Freq: Once | INTRAMUSCULAR | Status: AC
  Administered 2021-08-27: 10:00:00 1000 ug via INTRAMUSCULAR

## 2021-08-27 NOTE — Progress Notes (Signed)
Patient presented for B 12 injection to right deltoid, patient voiced no concerns nor showed any signs of distress during injection. 

## 2021-09-07 ENCOUNTER — Other Ambulatory Visit: Payer: Self-pay | Admitting: Internal Medicine

## 2021-09-13 ENCOUNTER — Other Ambulatory Visit: Payer: Self-pay

## 2021-09-13 ENCOUNTER — Encounter: Payer: Self-pay | Admitting: Internal Medicine

## 2021-09-13 ENCOUNTER — Ambulatory Visit (INDEPENDENT_AMBULATORY_CARE_PROVIDER_SITE_OTHER): Payer: Medicare Other | Admitting: Internal Medicine

## 2021-09-13 VITALS — BP 130/72 | HR 76 | Temp 97.6°F | Resp 16 | Wt 210.2 lb

## 2021-09-13 DIAGNOSIS — R7301 Impaired fasting glucose: Secondary | ICD-10-CM

## 2021-09-13 DIAGNOSIS — I10 Essential (primary) hypertension: Secondary | ICD-10-CM | POA: Diagnosis not present

## 2021-09-13 DIAGNOSIS — I471 Supraventricular tachycardia: Secondary | ICD-10-CM | POA: Diagnosis not present

## 2021-09-13 DIAGNOSIS — G4733 Obstructive sleep apnea (adult) (pediatric): Secondary | ICD-10-CM | POA: Diagnosis not present

## 2021-09-13 DIAGNOSIS — E669 Obesity, unspecified: Secondary | ICD-10-CM

## 2021-09-13 DIAGNOSIS — Z125 Encounter for screening for malignant neoplasm of prostate: Secondary | ICD-10-CM

## 2021-09-13 DIAGNOSIS — E785 Hyperlipidemia, unspecified: Secondary | ICD-10-CM | POA: Diagnosis not present

## 2021-09-13 DIAGNOSIS — E538 Deficiency of other specified B group vitamins: Secondary | ICD-10-CM | POA: Diagnosis not present

## 2021-09-13 DIAGNOSIS — N400 Enlarged prostate without lower urinary tract symptoms: Secondary | ICD-10-CM | POA: Diagnosis not present

## 2021-09-13 DIAGNOSIS — E559 Vitamin D deficiency, unspecified: Secondary | ICD-10-CM

## 2021-09-13 LAB — COMPREHENSIVE METABOLIC PANEL
ALT: 15 U/L (ref 0–53)
AST: 14 U/L (ref 0–37)
Albumin: 4.4 g/dL (ref 3.5–5.2)
Alkaline Phosphatase: 58 U/L (ref 39–117)
BUN: 17 mg/dL (ref 6–23)
CO2: 28 mEq/L (ref 19–32)
Calcium: 9.6 mg/dL (ref 8.4–10.5)
Chloride: 104 mEq/L (ref 96–112)
Creatinine, Ser: 1.11 mg/dL (ref 0.40–1.50)
GFR: 65.14 mL/min (ref >60.00)
Glucose, Bld: 83 mg/dL (ref 70–99)
Potassium: 3.9 mEq/L (ref 3.5–5.1)
Sodium: 138 mEq/L (ref 135–145)
Total Bilirubin: 1.1 mg/dL (ref 0.2–1.2)
Total Protein: 6.7 g/dL (ref 6.0–8.3)

## 2021-09-13 LAB — MICROALBUMIN / CREATININE URINE RATIO
Creatinine,U: 81.8 mg/dL
Microalb Creat Ratio: 0.9 mg/g (ref 0.0–30.0)
Microalb, Ur: <0.7 mg/dL (ref 0.0–1.9)

## 2021-09-13 LAB — LIPID PANEL
Cholesterol: 135 mg/dL (ref 0–200)
HDL: 31.9 mg/dL — ABNORMAL LOW (ref >39.00)
LDL Cholesterol: 68 mg/dL (ref 0–99)
NonHDL: 103.54
Total CHOL/HDL Ratio: 4
Triglycerides: 177 mg/dL — ABNORMAL HIGH (ref 0.0–149.0)
VLDL: 35.4 mg/dL (ref 0.0–40.0)

## 2021-09-13 LAB — VITAMIN D 25 HYDROXY (VIT D DEFICIENCY, FRACTURES): VITD: 19.47 ng/mL — ABNORMAL LOW (ref 30.00–100.00)

## 2021-09-13 LAB — HEMOGLOBIN A1C: Hgb A1c MFr Bld: 5.7 % (ref 4.6–6.5)

## 2021-09-13 LAB — PSA, MEDICARE: PSA: 1.2 ng/ml (ref 0.10–4.00)

## 2021-09-13 LAB — VITAMIN B12: Vitamin B-12: 559 pg/mL (ref 211–911)

## 2021-09-13 MED ORDER — MELOXICAM 15 MG PO TABS: 15.0000 mg | ORAL_TABLET | ORAL | 1 refills | Status: AC

## 2021-09-13 NOTE — Assessment & Plan Note (Signed)
Non fasting lipid panel drawn today.

## 2021-09-13 NOTE — Assessment & Plan Note (Signed)
Managed with diltiazem CD.  Likely due to untreated OSA  Due to mask intolerance.

## 2021-09-13 NOTE — Patient Instructions (Signed)
GOOD TO SEE YOU!   GLAD YOU ARE LOSING WEIGHT!  YOUR GOAL IS TO LOSE 20 LBS THIS YEAR.

## 2021-09-13 NOTE — Assessment & Plan Note (Signed)
Well controlled on current regimen of losartan hct and diltiazem CD . Renal function stable, no changes today.  Lab Results  Component Value Date   CREATININE 1.09 08/10/2021   Lab Results  Component Value Date   NA 141 08/10/2021   K 4.3 08/10/2021   CL 103 08/10/2021   CO2 24 08/10/2021

## 2021-09-13 NOTE — Assessment & Plan Note (Signed)
Currently asymptomatic.  Not under the care of a urologist. Reviewed OTC meds that can cause urinary retention in males.

## 2021-09-13 NOTE — Assessment & Plan Note (Signed)
I have congratulated him in reduction of   BMI and encouraged  Continued weight loss with goal of 10% of body weigh over the next 6 months using a low glycemic index diet and regular exercise a minimum of 5 days per week.   

## 2021-09-13 NOTE — Assessment & Plan Note (Signed)
Untreated for years due to mask intolerance.  He has poor sleep but remains uninterested in resuming CPAP. Weight loss advised and long term complications of untreated OSA reviewed with patient  

## 2021-09-13 NOTE — Progress Notes (Addendum)
Subjective:  Patient ID: Austin Duran, male    DOB: 11/06/1945  Age: 76 y.o. MRN: [id number]  CC: The primary encounter diagnosis was Screening for prostate cancer. Diagnoses of Dyslipidemia with elevated low density lipoprotein cholesterol and abnormally low high density lipoprotein cholesterol, Primary hypertension, B12 deficiency, Vitamin D deficiency, Obesity (BMI 30-39.9), IFG (impaired fasting glucose), OSA (obstructive sleep apnea), Enlarged prostate without lower urinary tract symptoms (luts), and SVT (supraventricular tachycardia) ([place]) were also pertinent to this visit.   This visit occurred during the SARS-CoV-2 public health emergency.  Safety protocols were in place, including screening questions prior to the visit, additional usage of staff PPE, and extensive cleaning of exam room while observing appropriate contact time as indicated for disinfecting solutions.    HPI DWAYNE BULKLEY presents for  Chief Complaint  Patient presents with   Follow-up   76 yr old male last seen by me in office Jan 2021  presents for follow up on  Obesity with OSA, hypertension and dyslipidemia .   Since last appt:  1) Cardiology: saw Caryl Comes in December for SVT.   Negative myoview Jan 22.[name]  advised to lose weight and follow a lower sodium diet for mgmt  of hypertension  2) Obesity:  down 7 lbs since jan 2022 intentional.  More salads,  cutting out fires and buns. Not exercising due to habits.  Works until6 pm.  Shelda Pal all day doing Neurosurgeon. .  Not treating OSA.  Wakes up tired.  Did not tolerate the mask trial.    3) COVID INFECTION Dec 10 by Urgent Care. (2nd COVID infection ) Tested  after "URI" failed to resolve  with doxycycline .  Received the antiviral  on Dec 10.   4) ate sausage egg and biscuit today.at [employer] and eggs.   5) low back pain:  Using meloxicam  every other day.  .    Outpatient Medications Prior to Visit  Medication Sig Dispense Refill    Cholecalciferol (VITAMIN D-3 PO) Take 1 tablet by mouth daily.     citalopram (CELEXA) 20 MG tablet TAKE 1 TABLET(20 MG) BY MOUTH DAILY 90 tablet 0   diltiazem (CARDIZEM CD) 120 MG 24 hr capsule TAKE 1 CAPSULE(120 MG) BY MOUTH DAILY 90 capsule 0   furosemide (LASIX) 20 MG tablet TAKE 1 TABLET BY MOUTH 1 TO 2 TIMES WEEKLY AS NEEDED FOR INCREASE SHORTNESS OF BREATH/ SWELLING 25 tablet 0   losartan-hydrochlorothiazide (HYZAAR) 50-12.5 MG tablet TAKE 1 TABLET BY MOUTH DAILY 90 tablet 0   meloxicam (MOBIC) 15 MG tablet Take 15 mg by mouth daily.     No facility-administered medications prior to visit.    Review of Systems;  Patient denies headache, fevers, malaise, unintentional weight loss, skin rash, eye pain, sinus congestion and sinus pain, sore throat, dysphagia,  hemoptysis , cough, dyspnea, wheezing, chest pain, palpitations, orthopnea, edema, abdominal pain, nausea, melena, diarrhea, constipation, flank pain, dysuria, hematuria, urinary  Frequency, nocturia, numbness, tingling, seizures,  Focal weakness, Loss of consciousness,  Tremor, insomnia, depression, anxiety, and suicidal ideation.      Objective:  BP 130/72    Pulse 76    Temp 97.6 F (36.4 C)    Resp 16    Wt 210 lb 3.2 oz (95.3 kg)    SpO2 98%    BMI 31.04 kg/m   BP Readings from Last 3 Encounters:  09/13/21 130/72  08/14/21 123/62  08/10/21 140/78    Wt Readings from  Last 3 Encounters:  09/13/21 210 lb 3.2 oz (95.3 kg)  08/14/21 210 lb (95.3 kg)  08/10/21 211 lb (95.7 kg)    General appearance: alert, cooperative and appears stated age Ears: normal TM's and external ear canals both ears Throat: lips, mucosa, and tongue normal; teeth and gums normal Neck: no adenopathy, no carotid bruit, supple, symmetrical, trachea midline and thyroid not enlarged, symmetric, no tenderness/mass/nodules Back: symmetric, no curvature. ROM normal. No CVA tenderness. Lungs: clear to auscultation bilaterally Heart: regular rate and  rhythm, S1, S2 normal, no murmur, click, rub or gallop Abdomen: soft, non-tender; bowel sounds normal; no masses,  no organomegaly Pulses: 2+ and symmetric Skin: Skin color, texture, turgor normal. No rashes or lesions Lymph nodes: Cervical, supraclavicular, and axillary nodes normal.  Lab Results  Component Value Date   HGBA1C 5.7 09/13/2021   HGBA1C 5.9 10/21/2019   HGBA1C 5.8 07/03/2018    Lab Results  Component Value Date   CREATININE 1.11 09/13/2021   CREATININE 1.09 08/10/2021   CREATININE 1.10 10/21/2019    Lab Results  Component Value Date   WBC 7.0 10/21/2019   HGB 14.7 10/21/2019   HCT 42.9 10/21/2019   PLT 256.0 10/21/2019   GLUCOSE 83 09/13/2021   CHOL 135 09/13/2021   TRIG 177.0 (H) 09/13/2021   HDL 31.90 (L) 09/13/2021   LDLCALC 68 09/13/2021   ALT 15 09/13/2021   AST 14 09/13/2021   NA 138 09/13/2021   K 3.9 09/13/2021   CL 104 09/13/2021   CREATININE 1.11 09/13/2021   BUN 17 09/13/2021   CO2 28 09/13/2021   TSH 1.93 10/21/2019   PSA 1.20 09/13/2021   HGBA1C 5.7 09/13/2021   MICROALBUR <0.7 09/13/2021    No results found.  Assessment & Plan:   Problem List Items Addressed This Visit     Screening for prostate cancer - Primary   Relevant Orders   PSA, Medicare (Completed)   Dyslipidemia with elevated low density lipoprotein cholesterol and abnormally low high density lipoprotein cholesterol    Non fasting lipid panel drawn today.        Relevant Orders   Lipid panel (Completed)   Obesity (BMI 30-39.9)    I have congratulated him in reduction of   BMI and encouraged  Continued weight loss with goal of 10% of body weigh over the next 6 months using a low glycemic index diet and regular exercise a minimum of 5 days per week.         Hypertension    Well controlled on current regimen of losartan hct and diltiazem CD . Renal function stable, no changes today.  Lab Results  Component Value Date   CREATININE 1.09 08/10/2021   Lab Results   Component Value Date   NA 141 08/10/2021   K 4.3 08/10/2021   CL 103 08/10/2021   CO2 24 08/10/2021         Relevant Orders   Comprehensive metabolic panel (Completed)   Microalbumin / creatinine urine ratio (Completed)   Enlarged prostate without lower urinary tract symptoms (luts)    Currently asymptomatic.  Not under the care of a urologist. Reviewed OTC meds that can cause urinary retention in males.       Vitamin D deficiency    Recurrent.  Resume megadose weekly for 3 months       Relevant Orders   VITAMIN D 25 Hydroxy (Vit-D Deficiency, Fractures) (Completed)   B12 deficiency   Relevant Orders   Vitamin B12 (  Completed)   OSA (obstructive sleep apnea)    Untreated for years due to mask intolerance.  He has poor sleep but remains uninterested in resuming CPAP. Weight loss advised and long term complications of untreated OSA reviewed with patient       SVT (supraventricular tachycardia) ([place])    Managed with diltiazem CD.  Likely due to untreated OSA  Due to mask intolerance.       Other Visit Diagnoses     IFG (impaired fasting glucose)       Relevant Orders   Hemoglobin A1c (Completed)       I have changed Otho Ket. [place] "Mike"'s meloxicam. I am also having him start on ergocalciferol. Additionally, I am having him maintain his Cholecalciferol (VITAMIN D-3 PO), furosemide, losartan-hydrochlorothiazide, citalopram, and diltiazem.  Meds ordered this encounter  Medications   meloxicam (MOBIC) 15 MG tablet    Sig: Take 1 tablet (15 mg total) by mouth every other day.    Dispense:  45 tablet    Refill:  1   ergocalciferol (VITAMIN D2) 1.25 MG (50000 UT) capsule    Sig: Take 1 capsule (50,000 Units total) by mouth once a week.    Dispense:  4 capsule    Refill:  2     I provided  30 minutes of  face-to-face time during this encounter reviewing patient's current problems and past surgeries, labs and imaging studies, providing counseling on the above  mentioned problems , and coordination  of care .   Follow-up: No follow-ups on file.   [name], MD

## 2021-09-15 MED ORDER — ERGOCALCIFEROL 1.25 MG (50000 UT) PO CAPS: 50000.0000 [IU] | ORAL_CAPSULE | ORAL | 2 refills | Status: DC

## 2021-09-15 NOTE — Assessment & Plan Note (Signed)
Recurrent.  Resume megadose weekly for 3 months

## 2021-09-15 NOTE — Addendum Note (Signed)
Addended by: [name] on: 09/15/2021 09:29 PM   Modules accepted: Orders

## 2021-09-16 ENCOUNTER — Telehealth: Payer: Self-pay

## 2021-09-16 NOTE — Telephone Encounter (Signed)
LMTCB in regards to lab results.  

## 2021-09-28 ENCOUNTER — Ambulatory Visit (INDEPENDENT_AMBULATORY_CARE_PROVIDER_SITE_OTHER): Payer: Medicare Other

## 2021-09-28 ENCOUNTER — Other Ambulatory Visit: Payer: Self-pay

## 2021-09-28 DIAGNOSIS — E538 Deficiency of other specified B group vitamins: Secondary | ICD-10-CM

## 2021-09-28 MED ORDER — CYANOCOBALAMIN 1000 MCG/ML IJ SOLN
1000.0000 ug | Freq: Once | INTRAMUSCULAR | Status: AC
  Administered 2021-09-28: 16:00:00 1000 ug via INTRAMUSCULAR

## 2021-09-28 NOTE — Progress Notes (Signed)
Austin Duran presents today for injection per MD orders. B12 injection administered IM in right Upper Arm. Administration without incident. Patient tolerated well.  Arend Bahl,cma

## 2021-11-02 ENCOUNTER — Ambulatory Visit (INDEPENDENT_AMBULATORY_CARE_PROVIDER_SITE_OTHER): Payer: Medicare Other

## 2021-11-02 ENCOUNTER — Other Ambulatory Visit: Payer: Self-pay

## 2021-11-02 DIAGNOSIS — E538 Deficiency of other specified B group vitamins: Secondary | ICD-10-CM | POA: Diagnosis not present

## 2021-11-02 MED ORDER — CYANOCOBALAMIN 1000 MCG/ML IJ SOLN
1000.0000 ug | Freq: Once | INTRAMUSCULAR | Status: AC
  Administered 2021-11-02: 1000 ug via INTRAMUSCULAR

## 2021-11-02 NOTE — Progress Notes (Signed)
Patient presented for B 12 injection to left deltoid, patient voiced no concerns nor showed any signs of distress during injection. 

## 2021-12-01 ENCOUNTER — Other Ambulatory Visit: Payer: Self-pay

## 2021-12-01 ENCOUNTER — Ambulatory Visit (INDEPENDENT_AMBULATORY_CARE_PROVIDER_SITE_OTHER): Payer: Medicare Other

## 2021-12-01 DIAGNOSIS — E538 Deficiency of other specified B group vitamins: Secondary | ICD-10-CM

## 2021-12-01 MED ORDER — CYANOCOBALAMIN 1000 MCG/ML IJ SOLN
1000.0000 ug | Freq: Once | INTRAMUSCULAR | Status: AC
  Administered 2021-12-01: 1000 ug via INTRAMUSCULAR

## 2021-12-01 NOTE — Progress Notes (Signed)
Pt arrived for B12 injection, given in L deltoid. Pt tolerated injection well, showed no signs of distress nor voiced any concerns.  ?

## 2021-12-05 ENCOUNTER — Other Ambulatory Visit: Payer: Self-pay | Admitting: Internal Medicine

## 2022-01-04 ENCOUNTER — Ambulatory Visit (INDEPENDENT_AMBULATORY_CARE_PROVIDER_SITE_OTHER): Payer: Medicare Other | Admitting: *Deleted

## 2022-01-04 DIAGNOSIS — E538 Deficiency of other specified B group vitamins: Secondary | ICD-10-CM

## 2022-01-04 MED ORDER — CYANOCOBALAMIN 1000 MCG/ML IJ SOLN
1000.0000 ug | Freq: Once | INTRAMUSCULAR | Status: AC
  Administered 2022-01-04: 1000 ug via INTRAMUSCULAR

## 2022-01-04 NOTE — Progress Notes (Signed)
Patient presented for B 12 injection to right deltoid, patient voiced no concerns nor showed any signs of distress during injection. 

## 2022-02-04 ENCOUNTER — Ambulatory Visit (INDEPENDENT_AMBULATORY_CARE_PROVIDER_SITE_OTHER): Payer: Medicare Other

## 2022-02-04 DIAGNOSIS — E538 Deficiency of other specified B group vitamins: Secondary | ICD-10-CM | POA: Diagnosis not present

## 2022-02-04 MED ORDER — CYANOCOBALAMIN 1000 MCG/ML IJ SOLN
1000.0000 ug | Freq: Once | INTRAMUSCULAR | Status: AC
  Administered 2022-02-04: 1000 ug via INTRAMUSCULAR

## 2022-02-04 NOTE — Progress Notes (Signed)
Patient presented for B 12 injection to left deltoid, patient voiced no concerns nor showed any signs of distress during injection. 

## 2022-03-07 ENCOUNTER — Other Ambulatory Visit: Payer: Self-pay | Admitting: Internal Medicine

## 2022-03-09 ENCOUNTER — Ambulatory Visit: Payer: Medicare Other

## 2022-03-10 ENCOUNTER — Ambulatory Visit (INDEPENDENT_AMBULATORY_CARE_PROVIDER_SITE_OTHER): Payer: Medicare Other

## 2022-03-10 VITALS — Ht 69.0 in | Wt 207.0 lb

## 2022-03-10 DIAGNOSIS — Z0000 Encounter for general adult medical examination without abnormal findings: Secondary | ICD-10-CM

## 2022-03-10 NOTE — Patient Instructions (Addendum)
  Austin Duran , Thank you for taking time to come for your Medicare Wellness Visit. I appreciate your ongoing commitment to your health goals. Please review the following plan we discussed and let me know if I can assist you in the future.   These are the goals we discussed:  Goals       Patient Stated     DIET - REDUCE PORTION SIZE (pt-stated)      Stay active Portion control         This is a list of the screening recommended for you and due dates:  Health Maintenance  Topic Date Due   Zoster (Shingles) Vaccine (1 of 2) 06/10/2022*   Flu Shot  04/05/2022   Cologuard (Stool DNA test)  10/07/2022   Tetanus Vaccine  02/14/2026   Pneumonia Vaccine  Completed   Hepatitis C Screening: USPSTF Recommendation to screen - Ages 18-79 yo.  Completed   HPV Vaccine  Aged Out   Colon Cancer Screening  Discontinued   COVID-19 Vaccine  Discontinued  *Topic was postponed. The date shown is not the original due date.

## 2022-03-10 NOTE — Progress Notes (Addendum)
Subjective:   Austin Duran is a 76 y.o. male who presents for Medicare Annual/Subsequent preventive examination.  Review of Systems    No ROS.  Medicare Wellness Virtual Visit.  Visual/audio telehealth visit, UTA vital signs.   See social history for additional risk factors.   Cardiac Risk Factors include: advanced age (>68mn, >>28women);male gender;hypertension     Objective:    Today's Vitals   03/10/22 1155  Weight: 207 lb (93.9 kg)  Height: '5\' 9"'$  (1.753 m)   Body mass index is 30.57 kg/m.     03/10/2022   11:53 AM 08/14/2021    9:01 AM 07/09/2021    9:30 AM 03/09/2021   12:56 PM 03/06/2020   11:38 AM 03/06/2019   11:33 AM 07/18/2017   10:23 AM  Advanced Directives  Does Patient Have a Medical Advance Directive? No No No No Yes No Yes  Type of APersonnel officerLiving will  Living will;Healthcare Power of Attorney  Does patient want to make changes to medical advance directive?    No - Patient declined No - Patient declined  No - Patient declined  Copy of [place]?     No - copy requested  No - copy requested  Would patient like information on creating a medical advance directive? No - Patient declined     No - Patient declined     Current Medications (verified) Outpatient Encounter Medications as of 03/10/2022  Medication Sig   Cholecalciferol (VITAMIN D-3 PO) Take 1 tablet by mouth daily.   citalopram (CELEXA) 20 MG tablet TAKE 1 TABLET(20 MG) BY MOUTH DAILY   diltiazem (CARDIZEM CD) 120 MG 24 hr capsule TAKE 1 CAPSULE(120 MG) BY MOUTH DAILY   ergocalciferol (VITAMIN D2) 1.25 MG (50000 UT) capsule Take 1 capsule (50,000 Units total) by mouth once a week.   furosemide (LASIX) 20 MG tablet TAKE 1 TABLET BY MOUTH 1 TO 2 TIMES WEEKLY AS NEEDED FOR INCREASE SHORTNESS OF BREATH/ SWELLING   losartan-hydrochlorothiazide (HYZAAR) 50-12.5 MG tablet TAKE 1 TABLET BY MOUTH DAILY   meloxicam (MOBIC) 15 MG tablet Take 1  tablet (15 mg total) by mouth every other day.   No facility-administered encounter medications on file as of 03/10/2022.    Allergies (verified) Augmentin [amoxicillin-pot clavulanate] and Lisinopril   History: Past Medical History:  Diagnosis Date   Depression    Hearing loss    had a perforated right TM,   followed by Dr. MTami Ribas  Hypertension    Panic attacks    PVC's (premature ventricular contractions)    Past Surgical History:  Procedure Laterality Date   CATARACT EXTRACTION Bilateral Aug 2013   Porfilio   CATARACT EXTRACTION, BILATERAL  May 2013   Porfilio   EYE SURGERY  2008   eyelid surgery, Dr. CLoletta Specter  FRACTURE SURGERY     TYMPANIC MEMBRANE REPAIR     [facility] , right    Family History  Problem Relation Age of Onset   Cancer Maternal Aunt        lung   Cancer Paternal Uncle        colon   Social History   Socioeconomic History   Marital status: Married    Spouse name: Not on file   Number of children: Not on file   Years of education: Not on file   Highest education level: Not on file  Occupational History  Comment: works 5 days a week  Tobacco Use   Smoking status: Former    Types: Cigarettes    Quit date: 08/03/2004    Years since quitting: 17.6   Smokeless tobacco: Never  Vaping Use   Vaping Use: Never used  Substance and Sexual Activity   Alcohol use: Yes    Alcohol/week: 5.0 standard drinks of alcohol    Types: 5 Standard drinks or equivalent per week   Drug use: No   Sexual activity: Yes  Other Topics Concern   Not on file  Social History Narrative   Not on file   Social Determinants of Health   Financial Resource Strain: Low Risk  (03/10/2022)   Overall Financial Resource Strain (CARDIA)    Difficulty of Paying Living Expenses: Not hard at all  Food Insecurity: No Food Insecurity (03/10/2022)   Hunger Vital Sign    Worried About Running Out of Food in the Last Year: Never true    Ran Out of Food in the Last Year: Never true   Transportation Needs: No Transportation Needs (03/10/2022)   PRAPARE - Hydrologist (Medical): No    Lack of Transportation (Non-Medical): No  Physical Activity: Sufficiently Active (03/10/2022)   Exercise Vital Sign    Days of Exercise per Week: 5 days    Minutes of Exercise per Session: 30 min  Stress: No Stress Concern Present (03/10/2022)   [place]    Feeling of Stress : Not at all  Social Connections: Unknown (03/10/2022)   Social Connection and Isolation Panel [NHANES]    Frequency of Communication with Friends and Family: More than three times a week    Frequency of Social Gatherings with Friends and Family: More than three times a week    Attends Religious Services: Not on Advertising copywriter or Organizations: Not on file    Attends Archivist Meetings: Not on file    Marital Status: Married    Tobacco Counseling Counseling given: Not Answered  Clinical Intake: Pre-visit preparation completed: Yes       Diabetes: No  How often do you need to have someone help you when you read instructions, pamphlets, or other written materials from your doctor or pharmacy?: 1 - Never  Interpreter Needed?: No    Activities of Daily Living    03/10/2022   11:59 AM  In your present state of health, do you have any difficulty performing the following activities:  Hearing? 1  Vision? 0  Difficulty concentrating or making decisions? 0  Walking or climbing stairs? 0  Dressing or bathing? 0  Doing errands, shopping? 0  Preparing Food and eating ? N  Using the Toilet? N  In the past six months, have you accidently leaked urine? N  Do you have problems with loss of bowel control? N  Managing your Medications? N  Managing your Finances? N  Housekeeping or managing your Housekeeping? N   Patient Care Team: [name], MD as PCP - General (Internal  Medicine)  Indicate any recent Medical Services you may have received from other than [facility] providers in the past year (date may be approximate).     Assessment:   This is a routine wellness examination for Austin Duran.  Virtual Visit via Telephone Note  I connected with  Austin Duran on 03/10/22 at 11:30 AM EDT by telephone and verified that I am speaking  with the correct person using two identifiers.  Persons participating in the virtual visit: patient/Nurse Health Advisor   I discussed the limitations of performing an evaluation and management service by telehealth. We continued and completed visit with audio only. Some vital signs may be absent or patient reported.   Hearing/Vision screen Hearing Screening - Comments:: Patient has difficulty hearing conversational tones. Occupational hearing loss. Audiology deferred per patient preference.  Vision Screening - Comments:: Followed by [facility], [place] Cataract extraction, bilateral They have seen their ophthalmologist  Dietary issues and exercise activities discussed: Current Exercise Habits: Home exercise routine, Type of exercise: walking, Intensity: Mild Regular diet Good water intake    Goals Addressed               This Visit's Progress     Patient Stated     DIET - REDUCE PORTION SIZE (pt-stated)   On track     Stay active Portion control        Depression Screen    03/10/2022   11:53 AM 03/09/2021   12:55 PM 03/06/2020   11:39 AM 03/06/2019   11:34 AM 07/03/2018    4:08 PM 07/18/2017   10:12 AM 08/08/2016    4:06 PM  PHQ 2/9 Scores  PHQ - 2 Score 0 0 0 0 0 0 0  PHQ- 9 Score     1 0     Fall Risk    03/10/2022   11:59 AM 03/09/2021   12:58 PM 03/06/2020   11:39 AM 09/19/2019   11:18 AM 03/06/2019   11:34 AM  [place] in the past year? 0 0 0 1 0  Number falls in past yr:  0 0 0   Injury with Fall?  0  0   Follow up Falls evaluation completed Falls evaluation completed Falls evaluation completed  Falls evaluation completed     [place]: Home free of loose throw rugs in walkways, pet beds, electrical cords, etc? Yes  Adequate lighting in your home to reduce risk of falls? Yes   ASSISTIVE DEVICES UTILIZED TO PREVENT FALLS: Life alert? No  Use of a cane, walker or w/c? No   TIMED UP AND GO: Was the test performed? No .   Cognitive Function: Patient is alert and oriented x3.      03/06/2020   11:55 AM 07/15/2016   10:18 AM 07/23/2015    3:47 PM  MMSE - Mini Mental State Exam  Not completed: Unable to complete    Orientation to time  5 5  Orientation to Place  5 5  Registration  3 3  Attention/ Calculation  5 5  Recall  3 3  Language- name 2 objects  2 2  Language- repeat  1 1  Language- follow 3 step command  3 3  Language- read & follow direction  1 1  Write a sentence  1 1  Copy design  1 1  Total score  30 30        03/10/2022   12:02 PM 03/09/2021   12:58 PM 03/06/2019   11:35 AM 07/18/2017   10:41 AM 07/15/2016   10:23 AM  6CIT Screen  What Year? 0 points 0 points 0 points 0 points 0 points  What month? 0 points 0 points 0 points 0 points 0 points  What time? 0 points 0 points 0 points 0 points 0 points  Count back from 20 0 points  0 points 0 points 0 points 0 points  Months in reverse 0 points 0 points 0 points 0 points 0 points  Repeat phrase 0 points 0 points 0 points 0 points   Total Score 0 points 0 points 0 points 0 points     Immunizations Immunization History  Administered Date(s) Administered   Fluad Quad(high Dose 65+) 07/28/2020, 07/21/2021   Influenza Split 08/04/2011, 07/04/2012, 06/15/2015   Influenza, High Dose Seasonal PF 07/02/2013, 07/18/2017, 05/11/2019   Influenza,inj,Quad PF,6+ Mos 07/10/2014, 04/26/2016   Influenza,inj,quad, With Preservative 06/13/2018   Pneumococcal Conjugate-13 07/02/2013   Pneumococcal Polysaccharide-23 07/10/2014, 08/08/2016   Pneumococcal-Unspecified 07/10/2009   Tdap  08/04/2011, 02/15/2016   Zoster, Live 07/26/2012   Shingrix Completed?: No.    Education has been provided regarding the importance of this vaccine. Patient has been advised to call insurance company to determine out of pocket expense if they have not yet received this vaccine. Advised may also receive vaccine at local pharmacy or Health Dept. Verbalized acceptance and understanding.  Screening Tests Health Maintenance  Topic Date Due   Zoster Vaccines- Shingrix (1 of 2) 06/10/2022 (Originally 08/06/1965)   INFLUENZA VACCINE  04/05/2022   Fecal DNA (Cologuard)  10/07/2022   TETANUS/TDAP  02/14/2026   Pneumonia Vaccine 69+ Years old  Completed   Hepatitis C Screening  Completed   HPV VACCINES  Aged Out   COLONOSCOPY (Pts 45-61yr Insurance coverage will need to be confirmed)  Discontinued   COVID-19 Vaccine  Discontinued   Health Maintenance There are no preventive care reminders to display for this patient.  Lung Cancer Screening: (Low Dose CT Chest recommended if Age 76-80years, 30 pack-year currently smoking OR have quit w/in 15years.) does not qualify.   Vision Screening: Recommended annual ophthalmology exams for early detection of glaucoma and other disorders of the eye.  Dental Screening: Recommended annual dental exams for proper oral hygiene  Community Resource Referral / Chronic Care Management: CRR required this visit?  No   CCM required this visit?  No      Plan:   Keep all routine maintenance appointments.   I have personally reviewed and noted the following in the patient's chart:   Medical and social history Use of alcohol, tobacco or illicit drugs  Current medications and supplements including opioid prescriptions. Patient is not currently taking opioid prescriptions. Functional ability and status Nutritional status Physical activity Advanced directives List of other physicians Hospitalizations, surgeries, and ER visits in previous 12  months Vitals Screenings to include cognitive, depression, and falls Referrals and appointments  In addition, I have reviewed and discussed with patient certain preventive protocols, quality metrics, and best practice recommendations. A written personalized care plan for preventive services as well as general preventive health recommendations were provided to patient.     OBrien-Blaney, Treyvion Durkee L, LPN   [id number]    I have reviewed the above information and agree with above.   TDeborra Medina MD

## 2022-03-15 ENCOUNTER — Ambulatory Visit (INDEPENDENT_AMBULATORY_CARE_PROVIDER_SITE_OTHER): Payer: Medicare Other

## 2022-03-15 DIAGNOSIS — E538 Deficiency of other specified B group vitamins: Secondary | ICD-10-CM

## 2022-03-15 MED ORDER — CYANOCOBALAMIN 1000 MCG/ML IJ SOLN
1000.0000 ug | Freq: Once | INTRAMUSCULAR | Status: AC
  Administered 2022-03-15: 1000 ug via INTRAMUSCULAR

## 2022-03-15 NOTE — Progress Notes (Signed)
Pt presented today for a b12 injection. Right deltoid, IM. Pt voiced no concerns nor showed any signs of distress during injection.    

## 2022-04-19 ENCOUNTER — Ambulatory Visit (INDEPENDENT_AMBULATORY_CARE_PROVIDER_SITE_OTHER): Payer: Medicare Other

## 2022-04-19 DIAGNOSIS — E538 Deficiency of other specified B group vitamins: Secondary | ICD-10-CM

## 2022-04-19 MED ORDER — CYANOCOBALAMIN 1000 MCG/ML IJ SOLN
1000.0000 ug | Freq: Once | INTRAMUSCULAR | Status: AC
  Administered 2022-04-19: 1000 ug via INTRAMUSCULAR

## 2022-04-19 NOTE — Progress Notes (Signed)
Patient presented for B 12 injection to left deltoid, patient voiced no concerns nor showed any signs of distress during injection. 

## 2022-05-05 DIAGNOSIS — H43813 Vitreous degeneration, bilateral: Secondary | ICD-10-CM | POA: Diagnosis not present

## 2022-05-20 ENCOUNTER — Ambulatory Visit (INDEPENDENT_AMBULATORY_CARE_PROVIDER_SITE_OTHER): Payer: Medicare Other

## 2022-05-20 DIAGNOSIS — E538 Deficiency of other specified B group vitamins: Secondary | ICD-10-CM | POA: Diagnosis not present

## 2022-05-20 MED ORDER — CYANOCOBALAMIN 1000 MCG/ML IJ SOLN
1000.0000 ug | Freq: Once | INTRAMUSCULAR | Status: AC
  Administered 2022-05-20: 1000 ug via INTRAMUSCULAR

## 2022-05-20 NOTE — Progress Notes (Signed)
Pt arrived for B12 injecton, given in R deltoid. Pt tolerated injection well, showed no signs of distress nor voiced any concerns.

## 2022-06-04 ENCOUNTER — Other Ambulatory Visit: Payer: Self-pay | Admitting: Internal Medicine

## 2022-06-21 ENCOUNTER — Ambulatory Visit (INDEPENDENT_AMBULATORY_CARE_PROVIDER_SITE_OTHER): Payer: Medicare Other

## 2022-06-21 DIAGNOSIS — E538 Deficiency of other specified B group vitamins: Secondary | ICD-10-CM | POA: Diagnosis not present

## 2022-06-21 MED ORDER — CYANOCOBALAMIN 1000 MCG/ML IJ SOLN
1000.0000 ug | Freq: Once | INTRAMUSCULAR | Status: AC
  Administered 2022-06-21: 1000 ug via INTRAMUSCULAR

## 2022-06-21 NOTE — Progress Notes (Signed)
Patient presented for B 12 injection to right deltoid, patient voiced no concerns nor showed any signs of distress during injection. 

## 2022-06-28 DIAGNOSIS — Z23 Encounter for immunization: Secondary | ICD-10-CM | POA: Diagnosis not present

## 2022-07-22 ENCOUNTER — Ambulatory Visit (INDEPENDENT_AMBULATORY_CARE_PROVIDER_SITE_OTHER): Payer: Medicare Other

## 2022-07-22 DIAGNOSIS — E538 Deficiency of other specified B group vitamins: Secondary | ICD-10-CM

## 2022-07-22 MED ORDER — CYANOCOBALAMIN 1000 MCG/ML IJ SOLN
1000.0000 ug | Freq: Once | INTRAMUSCULAR | Status: AC
  Administered 2022-07-22: 1000 ug via INTRAMUSCULAR

## 2022-07-22 NOTE — Progress Notes (Signed)
Pt arrived for B12 injection, given in L deltoid. Pt tolerated injection well, showed no signs of distress nor voiced any concerns.  ?

## 2022-08-23 ENCOUNTER — Ambulatory Visit (INDEPENDENT_AMBULATORY_CARE_PROVIDER_SITE_OTHER): Payer: Medicare Other

## 2022-08-23 DIAGNOSIS — E538 Deficiency of other specified B group vitamins: Secondary | ICD-10-CM

## 2022-08-23 MED ORDER — CYANOCOBALAMIN 1000 MCG/ML IJ SOLN
1000.0000 ug | Freq: Once | INTRAMUSCULAR | Status: AC
  Administered 2022-08-23: 1000 ug via INTRAMUSCULAR

## 2022-08-23 NOTE — Progress Notes (Signed)
Pt presented for their vitamin B12 injection. Pt was identified through two identifiers. Pt tolerated shot well in their right deltoid.  

## 2022-09-01 ENCOUNTER — Encounter: Payer: Self-pay | Admitting: Internal Medicine

## 2022-09-01 ENCOUNTER — Ambulatory Visit: Payer: Medicare Other | Attending: Internal Medicine | Admitting: Internal Medicine

## 2022-09-01 VITALS — BP 132/70 | HR 69 | Ht 68.0 in | Wt 210.0 lb

## 2022-09-01 DIAGNOSIS — R0602 Shortness of breath: Secondary | ICD-10-CM | POA: Diagnosis not present

## 2022-09-01 DIAGNOSIS — I119 Hypertensive heart disease without heart failure: Secondary | ICD-10-CM | POA: Diagnosis not present

## 2022-09-01 DIAGNOSIS — I4891 Unspecified atrial fibrillation: Secondary | ICD-10-CM | POA: Insufficient documentation

## 2022-09-01 DIAGNOSIS — I493 Ventricular premature depolarization: Secondary | ICD-10-CM | POA: Diagnosis not present

## 2022-09-01 MED ORDER — DILTIAZEM HCL ER COATED BEADS 120 MG PO CP24: ORAL_CAPSULE | ORAL | 3 refills | Status: DC

## 2022-09-01 NOTE — Addendum Note (Signed)
Addended byAlvis Lemmings C on: 09/01/2022 10:30 AM   Modules accepted: Orders

## 2022-09-01 NOTE — Progress Notes (Signed)
      Patient Care Team: [name], MD as PCP - General (Internal Medicine)   HPI  Austin Duran is a 76 y.o. male Seen in follow-up for PVCs and atrial fib detected on recent event recorder    1/22 c/o  dyspnea on exertion.  Myoview   negative  The patient denies chest pain, nocturnal dyspnea, orthopnea or peripheral edema.  There have been no palpitations, lightheadedness or syncope.  Complains of stable dyspnea .          DATE TEST EF   7/18 Echo   55-65   % LVH mild  1/22  MYOVIEW 55-65% Normal Perfusion   Date Cr K Hgb  12//18 0.98 4.1   10/19  1.17 4.2   2/21 1.1 3.9 14.7  1/23 1.1 3.9     Thromboembolic risk factors ( age -78, HTN-1) for a CHADSVASc Score of 2  Dx with B12 deficiency + IF  Records and Results Reviewed   Past Medical History:  Diagnosis Date   Depression    Hearing loss    had a perforated right TM,   followed by Dr. Tami Ribas   Hypertension    Panic attacks    PVC's (premature ventricular contractions)     Past Surgical History:  Procedure Laterality Date   CATARACT EXTRACTION Bilateral Aug 2013   Porfilio   CATARACT EXTRACTION, BILATERAL  May 2013   Porfilio   EYE SURGERY  2008   eyelid surgery, Dr. Loletta Specter   FRACTURE SURGERY     TYMPANIC MEMBRANE REPAIR     [facility] , right     Current Meds  Medication Sig   Cholecalciferol (VITAMIN D-3 PO) Take 1 tablet by mouth daily.   citalopram (CELEXA) 20 MG tablet TAKE 1 TABLET(20 MG) BY MOUTH DAILY   diltiazem (CARDIZEM CD) 120 MG 24 hr capsule TAKE 1 CAPSULE(120 MG) BY MOUTH DAILY   ergocalciferol (VITAMIN D2) 1.25 MG (50000 UT) capsule Take 1 capsule (50,000 Units total) by mouth once a week.   losartan-hydrochlorothiazide (HYZAAR) 50-12.5 MG tablet TAKE 1 TABLET BY MOUTH DAILY   meloxicam (MOBIC) 15 MG tablet Take 1 tablet (15 mg total) by mouth every other day.    Allergies  Allergen Reactions   Augmentin [Amoxicillin-Pot Clavulanate] Nausea Only   Lisinopril Cough       Review of Systems negative except from HPI and PMH  Physical Exam BP 132/70 (BP Location: Left Arm, Patient Position: Sitting, Cuff Size: Normal)   Pulse 69   Ht '5\' 8"'$  (1.727 m)   Wt 210 lb (95.3 kg)   SpO2 97%   BMI 31.93 kg/m  Well developed and nourished in no acute distress HENT normal Neck supple with JVP-  6-8 Clear Regular rate and rhythm, no murmurs or gallops Abd-soft with active BS No Clubbing cyanosis tr edema Skin-warm and dry A & Oriented  Grossly normal sensory and motor function  ECG sinus at 69 Oh 15/09/42 Otherwise normal  Assessment and  Plan  Dyspnea on exertion  PVCs-RVOT-exercise associated   Hypertension with hypertensive heart disease   Obesity-BMI greater than 30  Atrial Fib SCAF   No interval palpitations. Continue dilt  Continues with pressure borderline elevated.  Will continue the diltiazem 30 and losartan HCT Diet is salt replete, he eats out daily hard to change.  Not eating bread and is lost close to 10 pounds

## 2022-09-01 NOTE — Patient Instructions (Signed)
Medication Instructions:  - Your physician recommends that you continue on your current medications as directed. Please refer to the Current Medication list given to you today.  *If you need a refill on your cardiac medications before your next appointment, please call your pharmacy*   Lab Work: - none ordered  If you have labs (blood work) drawn today and your tests are completely normal, you will receive your results only by: MyChart Message (if you have MyChart) OR A paper copy in the mail If you have any lab test that is abnormal or we need to change your treatment, we will call you to review the results.   Testing/Procedures: - none ordered   Follow-Up: At [facility], you and your health needs are our priority.  As part of our continuing mission to provide you with exceptional heart care, we have created designated Provider Care Teams.  These Care Teams include your primary Cardiologist (physician) and Advanced Practice Providers (APPs -  Physician Assistants and Nurse Practitioners) who all work together to provide you with the care you need, when you need it.  We recommend signing up for the patient portal called "MyChart".  Sign up information is provided on this After Visit Summary.  MyChart is used to connect with patients for Virtual Visits (Telemedicine).  Patients are able to view lab/test results, encounter notes, upcoming appointments, etc.  Non-urgent messages can be sent to your provider as well.   To learn more about what you can do with MyChart, go to [email or web address].    Your next appointment:   1 year(s)  The format for your next appointment:   In Person  Provider:   Steven Klein, MD    Other Instructions N/a  Important Information About Sugar       

## 2022-09-06 ENCOUNTER — Other Ambulatory Visit: Payer: Self-pay | Admitting: Internal Medicine

## 2022-09-21 ENCOUNTER — Encounter: Payer: Self-pay | Admitting: Internal Medicine

## 2022-09-21 ENCOUNTER — Ambulatory Visit: Payer: Medicare PPO | Admitting: Internal Medicine

## 2022-09-21 VITALS — BP 142/84 | HR 66 | Temp 97.6°F | Ht 68.0 in | Wt 210.2 lb

## 2022-09-21 DIAGNOSIS — E538 Deficiency of other specified B group vitamins: Secondary | ICD-10-CM

## 2022-09-21 DIAGNOSIS — R17 Unspecified jaundice: Secondary | ICD-10-CM

## 2022-09-21 DIAGNOSIS — Z125 Encounter for screening for malignant neoplasm of prostate: Secondary | ICD-10-CM

## 2022-09-21 DIAGNOSIS — G5693 Unspecified mononeuropathy of bilateral upper limbs: Secondary | ICD-10-CM

## 2022-09-21 DIAGNOSIS — G4733 Obstructive sleep apnea (adult) (pediatric): Secondary | ICD-10-CM

## 2022-09-21 DIAGNOSIS — Z0000 Encounter for general adult medical examination without abnormal findings: Secondary | ICD-10-CM

## 2022-09-21 DIAGNOSIS — I10 Essential (primary) hypertension: Secondary | ICD-10-CM

## 2022-09-21 DIAGNOSIS — Z79899 Other long term (current) drug therapy: Secondary | ICD-10-CM | POA: Diagnosis not present

## 2022-09-21 DIAGNOSIS — R7301 Impaired fasting glucose: Secondary | ICD-10-CM

## 2022-09-21 DIAGNOSIS — E785 Hyperlipidemia, unspecified: Secondary | ICD-10-CM

## 2022-09-21 DIAGNOSIS — Z1211 Encounter for screening for malignant neoplasm of colon: Secondary | ICD-10-CM

## 2022-09-21 LAB — CBC WITH DIFFERENTIAL/PLATELET
Basophils Absolute: 0 10*3/uL (ref 0.0–0.1)
Basophils Relative: 0.7 % (ref 0.0–3.0)
Eosinophils Absolute: 0.1 10*3/uL (ref 0.0–0.7)
Eosinophils Relative: 1.9 % (ref 0.0–5.0)
HCT: 44.5 % (ref 39.0–52.0)
Hemoglobin: 15.2 g/dL (ref 13.0–17.0)
Lymphocytes Relative: 19 % (ref 12.0–46.0)
Lymphs Abs: 1.3 10*3/uL (ref 0.7–4.0)
MCHC: 34.2 g/dL (ref 30.0–36.0)
MCV: 92.6 fl (ref 78.0–100.0)
Monocytes Absolute: 0.9 10*3/uL (ref 0.1–1.0)
Monocytes Relative: 12.1 % — ABNORMAL HIGH (ref 3.0–12.0)
Neutro Abs: 4.7 10*3/uL (ref 1.4–7.7)
Neutrophils Relative %: 66.3 % (ref 43.0–77.0)
Platelets: 270 10*3/uL (ref 150.0–400.0)
RBC: 4.81 Mil/uL (ref 4.22–5.81)
RDW: 12.5 % (ref 11.5–15.5)
WBC: 7.1 10*3/uL (ref 4.0–10.5)

## 2022-09-21 LAB — COMPREHENSIVE METABOLIC PANEL
ALT: 16 U/L (ref 0–53)
AST: 16 U/L (ref 0–37)
Albumin: 4.5 g/dL (ref 3.5–5.2)
Alkaline Phosphatase: 56 U/L (ref 39–117)
BUN: 27 mg/dL — ABNORMAL HIGH (ref 6–23)
CO2: 31 mEq/L (ref 19–32)
Calcium: 10 mg/dL (ref 8.4–10.5)
Chloride: 102 mEq/L (ref 96–112)
Creatinine, Ser: 1.08 mg/dL (ref 0.40–1.50)
GFR: 66.84 mL/min (ref >60.00)
Glucose, Bld: 97 mg/dL (ref 70–99)
Potassium: 4.5 mEq/L (ref 3.5–5.1)
Sodium: 138 mEq/L (ref 135–145)
Total Bilirubin: 1.4 mg/dL — ABNORMAL HIGH (ref 0.2–1.2)
Total Protein: 6.8 g/dL (ref 6.0–8.3)

## 2022-09-21 LAB — LIPID PANEL
Cholesterol: 146 mg/dL (ref 0–200)
HDL: 38.8 mg/dL — ABNORMAL LOW (ref >39.00)
LDL Cholesterol: 89 mg/dL (ref 0–99)
NonHDL: 107.08
Total CHOL/HDL Ratio: 4
Triglycerides: 92 mg/dL (ref 0.0–149.0)
VLDL: 18.4 mg/dL (ref 0.0–40.0)

## 2022-09-21 LAB — TSH: TSH: 1.04 u[IU]/mL (ref 0.35–5.50)

## 2022-09-21 LAB — LDL CHOLESTEROL, DIRECT: Direct LDL: 102 mg/dL

## 2022-09-21 LAB — MICROALBUMIN / CREATININE URINE RATIO
Creatinine,U: 109.9 mg/dL
Microalb Creat Ratio: 0.6 mg/g (ref 0.0–30.0)
Microalb, Ur: <0.7 mg/dL (ref 0.0–1.9)

## 2022-09-21 LAB — HEMOGLOBIN A1C: Hgb A1c MFr Bld: 5.8 % (ref 4.6–6.5)

## 2022-09-21 LAB — VITAMIN B12: Vitamin B-12: 437 pg/mL (ref 211–911)

## 2022-09-21 MED ORDER — LOSARTAN POTASSIUM-HCTZ 50-12.5 MG PO TABS: 1.0000 | ORAL_TABLET | Freq: Every day | ORAL | 3 refills | Status: DC

## 2022-09-21 MED ORDER — CITALOPRAM HYDROBROMIDE 20 MG PO TABS: 20.0000 mg | ORAL_TABLET | Freq: Every day | ORAL | 3 refills | Status: DC

## 2022-09-21 NOTE — Progress Notes (Signed)
colo

## 2022-09-21 NOTE — Assessment & Plan Note (Signed)
elevation noted today on current regimen of losartan/hct 50/12.5 .  Patient has been asked to check bp at home and submit readings in 4 weeks.

## 2022-09-21 NOTE — Progress Notes (Signed)
Patient ID: Austin Duran, male    DOB: 1946/06/12  Age: 77 y.o. MRN: [id number]  The patient is here for annual PREVENTIVE  examination and management of other chronic and acute problems.   The risk factors are reflected in the social history.  The roster of all physicians providing medical care to patient - is listed in the Snapshot section of the chart.  Activities of daily living:  The patient is 100% independent in all ADLs: dressing, toileting, feeding as well as independent mobility  Home safety : The patient has smoke detectors in the home. They wear seatbelts.  There are no firearms at home. There is no violence in the home.   There is no risks for hepatitis, STDs or HIV. There is no   history of blood transfusion. They have no travel history to infectious disease endemic areas of the world.  The patient has seen their dentist in the last six month. They have seen their eye doctor in the last year. They admit to slight hearing difficulty with regard to whispered voices and some television programs.  They have deferred audiologic testing in the last year.  They do not  have excessive sun exposure. Discussed the need for sun protection: hats, long sleeves and use of sunscreen if there is significant sun exposure.   Diet: the importance of a healthy diet is discussed. They do have a healthy diet.  The benefits of regular aerobic exercise were discussed. She walks 4 times per week ,  20 minutes.   Depression screen: there are no signs or vegative symptoms of depression- irritability, change in appetite, anhedonia, sadness/tearfullness.  Cognitive assessment: the patient manages all their financial and personal affairs and is actively engaged. They could relate day,date,year and events; recalled 2/3 objects at 3 minutes; performed clock-face test normally.  The following portions of the patient's history were reviewed and updated as appropriate: allergies, current medications, past family  history, past medical history,  past surgical history, past social history  and problem list.  Visual acuity was not assessed per patient preference since she has regular follow up with her ophthalmologist. Hearing and body mass index were assessed and reviewed.   During the course of the visit the patient was educated and counseled about appropriate screening and preventive services including : fall prevention , diabetes screening, nutrition counseling, colorectal cancer screening, and recommended immunizations.    CC: The primary encounter diagnosis was Primary hypertension. Diagnoses of Dyslipidemia with elevated low density lipoprotein cholesterol and abnormally low high density lipoprotein cholesterol, IFG (impaired fasting glucose), Long-term use of high-risk medication, Prostate cancer screening, Colon cancer screening, B12 deficiency, Total bilirubin, elevated, Encounter for Medicare annual wellness exam, OSA (obstructive sleep apnea), and Bilateral neuropathy of upper extremities were also pertinent to this visit.  1) PAF:   seeing Caryl Comes ; takes diltiazem  . Not anticoagulated per Caryl Comes   2) HTN:  taking hyzaar sub maximal  dose. Does not check at home   3) h/o cataract surgery with lens implants.    has annual visits with porfilio  4) obesity:  limiting starches,  drinking diet coke 1-2  daily. No regular exercise.    5) occasional insomnia : recently used a 77 yr old alprazolam  tablet  0.5 mg , still works   6)  episode of vertigo with nausea lasted a day,   occurred 8 months ago.  No recurrence   7) THIRD COVID infection in December.  All 3 were mild  8)kyphosis noted:  still working ;  position results in [employer], contracting   History Austin Duran has a past medical history of Depression, Hearing loss, Hypertension, Panic attacks, and PVC's (premature ventricular contractions).   He has a past surgical history that includes Tympanic membrane repair; Fracture surgery;  Cataract extraction, bilateral (May 2013); Eye surgery (2008); and Cataract extraction (Bilateral, Aug 2013).   His family history includes Cancer in his maternal aunt and paternal uncle.He reports that he quit smoking about 18 years ago. His smoking use included cigarettes. He has never used smokeless tobacco. He reports current alcohol use of about 5.0 standard drinks of alcohol per week. He reports that he does not use drugs.  Outpatient Medications Prior to Visit  Medication Sig Dispense Refill   Cholecalciferol (VITAMIN D-3 PO) Take 1 tablet by mouth daily.     diltiazem (CARDIZEM CD) 120 MG 24 hr capsule TAKE 1 CAPSULE(120 MG) BY MOUTH DAILY 90 capsule 3   meloxicam (MOBIC) 15 MG tablet Take 1 tablet (15 mg total) by mouth every other day. 45 tablet 1   citalopram (CELEXA) 20 MG tablet TAKE 1 TABLET(20 MG) BY MOUTH DAILY 90 tablet 0   losartan-hydrochlorothiazide (HYZAAR) 50-12.5 MG tablet TAKE 1 TABLET BY MOUTH DAILY 90 tablet 0   furosemide (LASIX) 20 MG tablet TAKE 1 TABLET BY MOUTH 1 TO 2 TIMES WEEKLY AS NEEDED FOR INCREASE SHORTNESS OF BREATH/ SWELLING (Patient not taking: Reported on 09/01/2022) 25 tablet 0   ergocalciferol (VITAMIN D2) 1.25 MG (50000 UT) capsule Take 1 capsule (50,000 Units total) by mouth once a week. (Patient not taking: Reported on 09/21/2022) 4 capsule 2   No facility-administered medications prior to visit.    Review of Systems  Patient denies headache, fevers, malaise, unintentional weight loss, skin rash, eye pain, sinus congestion and sinus pain, sore throat, dysphagia,  hemoptysis , cough, dyspnea, wheezing, chest pain, palpitations, orthopnea, edema, abdominal pain, nausea, melena, diarrhea, constipation, flank pain, dysuria, hematuria, urinary  Frequency, nocturia, numbness, tingling, seizures,  Focal weakness, Loss of consciousness,  Tremor, insomnia, depression, anxiety, and suicidal ideation.     Objective:  BP (!) 142/84   Pulse 66   Temp 97.6 F  (36.4 C) (Oral)   Ht '5\' 8"'$  (1.727 m)   Wt 210 lb 3.2 oz (95.3 kg)   SpO2 96%   BMI 31.96 kg/m   Physical Exam Vitals reviewed.  Constitutional:      General: He is not in acute distress.    Appearance: Normal appearance. He is normal weight. He is not ill-appearing, toxic-appearing or diaphoretic.  HENT:     Head: Normocephalic and atraumatic.     Right Ear: Tympanic membrane, ear canal and external ear normal. There is no impacted cerumen.     Left Ear: Tympanic membrane, ear canal and external ear normal. There is no impacted cerumen.     Nose: Nose normal.     Mouth/Throat:     Mouth: Mucous membranes are moist.     Pharynx: Oropharynx is clear.  Eyes:     General: No scleral icterus.       Right eye: No discharge.        Left eye: No discharge.     Conjunctiva/sclera: Conjunctivae normal.  Neck:     Thyroid: No thyromegaly.     Vascular: No carotid bruit or JVD.  Cardiovascular:     Rate and Rhythm: Normal rate and regular rhythm.     Heart sounds: Normal  heart sounds.  Pulmonary:     Effort: Pulmonary effort is normal. No respiratory distress.     Breath sounds: Normal breath sounds.  Abdominal:     General: Bowel sounds are normal.     Palpations: Abdomen is soft. There is no mass.     Tenderness: There is no abdominal tenderness. There is no guarding or rebound.  Musculoskeletal:        General: Normal range of motion.     Cervical back: Normal range of motion and neck supple.  Lymphadenopathy:     Cervical: No cervical adenopathy.  Skin:    General: Skin is warm and dry.  Neurological:     General: No focal deficit present.     Mental Status: He is alert and oriented to person, place, and time. Mental status is at baseline.  Psychiatric:        Mood and Affect: Mood normal.        Behavior: Behavior normal.        Thought Content: Thought content normal.        Judgment: Judgment normal.     Assessment & Plan:  Primary hypertension Assessment &  Plan: elevation noted today on current regimen of losartan/hct 50/12.5 .  Patient has been asked to check bp at home and submit readings in 4 weeks.    Orders: -     Microalbumin / creatinine urine ratio -     Comprehensive metabolic panel  Dyslipidemia with elevated low density lipoprotein cholesterol and abnormally low high density lipoprotein cholesterol Assessment & Plan: 10 yr risk of CAD using the AHA risk calculator is 32%.  However Patient has no incidental evidence of atherosclerosis  or coronary calcifications and had a low risk result on his myoview in 2022. No treatment advised at this time    Lab Results  Component Value Date   CHOL 146 09/21/2022   HDL 38.80 (L) 09/21/2022   LDLCALC 89 09/21/2022   LDLDIRECT 102.0 09/21/2022   TRIG 92.0 09/21/2022   CHOLHDL 4 09/21/2022     Orders: -     Lipid panel -     LDL cholesterol, direct  IFG (impaired fasting glucose) -     Comprehensive metabolic panel -     Hemoglobin A1c  Long-term use of high-risk medication -     TSH -     CBC with Differential/Platelet  Prostate cancer screening  Colon cancer screening -     Cologuard  B12 deficiency Assessment & Plan: Recurrent, has not had injections in several months.. repeat level normal   Lab Results  Component Value Date   VITAMINB12 437 09/21/2022      Orders: -     Vitamin B12  Total bilirubin, elevated -     Comprehensive metabolic panel; Future  Encounter for Medicare annual wellness exam Assessment & Plan: age appropriate education and counseling updated, referrals for preventative services and immunizations addressed, dietary and smoking counseling addressed, most recent labs reviewed.  I have personally reviewed and have noted:   1) the patient's medical and social history 2) The pt's use of alcohol, tobacco, and illicit drugs 3) The patient's current medications and supplements 4) Functional ability including ADL's, fall risk, home safety risk,  hearing and visual impairment 5) Diet and physical activities 6) Evidence for depression or mood disorder 7) The patient's height, weight, and BMI have been recorded in the chart    I have made referrals, and provided counseling and education  based on review of the above    OSA (obstructive sleep apnea) Assessment & Plan: Untreated for years due to mask intolerance.  He has poor sleep but remains uninterested in resuming CPAP. Weight loss advised and long term complications of untreated OSA reviewed with patient    Bilateral neuropathy of upper extremities Assessment & Plan: Secondary to cervical spine degenerative changes secondary to occupationally overuse of neck flexion. causing encroachment on neural foramina bilaterally at mulitple levels by Feb 2018 CT.   Other orders -     Citalopram Hydrobromide; Take 1 tablet (20 mg total) by mouth daily.  Dispense: 90 tablet; Refill: 3 -     Losartan Potassium-HCTZ; Take 1 tablet by mouth daily.  Dispense: 90 tablet; Refill: 3    Follow-up: Return in about 6 months (around 03/22/2023) for hypertension.   [name], MD

## 2022-09-21 NOTE — Patient Instructions (Addendum)
Cologuard has been ordered   Check BP once a month and let me know if readings are > 140/80

## 2022-09-23 NOTE — Assessment & Plan Note (Signed)
10 yr risk of CAD using the AHA risk calculator is 32%.  However Patient has no incidental evidence of atherosclerosis  or coronary calcifications and had a low risk result on his myoview in 2022. No treatment advised at this time    Lab Results  Component Value Date   CHOL 146 09/21/2022   HDL 38.80 (L) 09/21/2022   LDLCALC 89 09/21/2022   LDLDIRECT 102.0 09/21/2022   TRIG 92.0 09/21/2022   CHOLHDL 4 09/21/2022

## 2022-09-23 NOTE — Assessment & Plan Note (Signed)

## 2022-09-23 NOTE — Assessment & Plan Note (Signed)
Recurrent, has not had injections in several months.. repeat level normal   Lab Results  Component Value Date   VITAMINB12 437 09/21/2022

## 2022-09-23 NOTE — Assessment & Plan Note (Signed)
Untreated for years due to mask intolerance.  He has poor sleep but remains uninterested in resuming CPAP. Weight loss advised and long term complications of untreated OSA reviewed with patient

## 2022-09-24 NOTE — Assessment & Plan Note (Addendum)
Secondary to cervical spine degenerative changes secondary to occupationally overuse of neck flexion. causing encroachment on neural foramina bilaterally at mulitple levels by Feb 2018 CT.

## 2022-09-26 ENCOUNTER — Ambulatory Visit: Payer: Medicare PPO

## 2022-09-26 DIAGNOSIS — E538 Deficiency of other specified B group vitamins: Secondary | ICD-10-CM | POA: Diagnosis not present

## 2022-09-26 MED ORDER — CYANOCOBALAMIN 1000 MCG/ML IJ SOLN
1000.0000 ug | Freq: Once | INTRAMUSCULAR | Status: AC
  Administered 2022-09-26: 1000 ug via INTRAMUSCULAR

## 2022-09-26 NOTE — Progress Notes (Signed)
Pt presented for their vitamin B12 injection. Pt was identified through two identifiers. Pt tolerated shot well in their left  deltoid.  

## 2022-10-26 LAB — COLOGUARD: COLOGUARD: NEGATIVE

## 2022-10-27 ENCOUNTER — Other Ambulatory Visit (INDEPENDENT_AMBULATORY_CARE_PROVIDER_SITE_OTHER): Payer: Medicare PPO

## 2022-10-27 ENCOUNTER — Ambulatory Visit (INDEPENDENT_AMBULATORY_CARE_PROVIDER_SITE_OTHER): Payer: Medicare PPO

## 2022-10-27 DIAGNOSIS — R17 Unspecified jaundice: Secondary | ICD-10-CM

## 2022-10-27 DIAGNOSIS — E538 Deficiency of other specified B group vitamins: Secondary | ICD-10-CM | POA: Diagnosis not present

## 2022-10-27 LAB — COMPREHENSIVE METABOLIC PANEL
ALT: 16 U/L (ref 0–53)
AST: 15 U/L (ref 0–37)
Albumin: 4.3 g/dL (ref 3.5–5.2)
Alkaline Phosphatase: 59 U/L (ref 39–117)
BUN: 25 mg/dL — ABNORMAL HIGH (ref 6–23)
CO2: 31 mEq/L (ref 19–32)
Calcium: 9.8 mg/dL (ref 8.4–10.5)
Chloride: 103 mEq/L (ref 96–112)
Creatinine, Ser: 1.15 mg/dL (ref 0.40–1.50)
GFR: 61.94 mL/min (ref >60.00)
Glucose, Bld: 120 mg/dL — ABNORMAL HIGH (ref 70–99)
Potassium: 4.1 mEq/L (ref 3.5–5.1)
Sodium: 139 mEq/L (ref 135–145)
Total Bilirubin: 1.3 mg/dL — ABNORMAL HIGH (ref 0.2–1.2)
Total Protein: 6.3 g/dL (ref 6.0–8.3)

## 2022-10-27 MED ORDER — CYANOCOBALAMIN 1000 MCG/ML IJ SOLN
1000.0000 ug | Freq: Once | INTRAMUSCULAR | Status: AC
  Administered 2022-10-27: 1000 ug via INTRAMUSCULAR

## 2022-10-27 NOTE — Progress Notes (Addendum)
Patient arrived for his B12 injection. Patient was administered a B12 injection into his left deltoid. Patient tolerated B12 injection well and did not show any signs of distress or voice any concerns.

## 2022-10-28 ENCOUNTER — Telehealth: Payer: Self-pay

## 2022-10-28 NOTE — Telephone Encounter (Signed)
error 

## 2022-11-28 ENCOUNTER — Ambulatory Visit (INDEPENDENT_AMBULATORY_CARE_PROVIDER_SITE_OTHER): Payer: Medicare PPO

## 2022-11-28 DIAGNOSIS — E538 Deficiency of other specified B group vitamins: Secondary | ICD-10-CM | POA: Diagnosis not present

## 2022-11-28 MED ORDER — CYANOCOBALAMIN 1000 MCG/ML IJ SOLN
1000.0000 ug | Freq: Once | INTRAMUSCULAR | Status: AC
  Administered 2022-11-28: 1000 ug via INTRAMUSCULAR

## 2022-11-28 NOTE — Progress Notes (Signed)
presents today for injection per MD orders. B12 injection administered IM in left Upper Arm. Administration without incident. Patient tolerated well.  Nayah Lukens,cma   

## 2022-12-04 ENCOUNTER — Other Ambulatory Visit: Payer: Self-pay | Admitting: Internal Medicine

## 2022-12-05 ENCOUNTER — Other Ambulatory Visit: Payer: Self-pay | Admitting: Internal Medicine

## 2022-12-05 ENCOUNTER — Telehealth: Payer: Self-pay | Admitting: Internal Medicine

## 2022-12-05 DIAGNOSIS — I10 Essential (primary) hypertension: Secondary | ICD-10-CM

## 2022-12-05 MED ORDER — LOSARTAN POTASSIUM-HCTZ 50-12.5 MG PO TABS: 2.0000 | ORAL_TABLET | Freq: Every day | ORAL | 3 refills | Status: DC

## 2022-12-05 NOTE — Telephone Encounter (Signed)
Pt called stating the provider wanted him to check his BP readings   Jan 23rd-166/82  Jan.30-162/79  Feb 6 164/80,   Feb 23-168/85   Feb-24- 150/80  march 31 146/66

## 2022-12-05 NOTE — Addendum Note (Signed)
Addended by: [name] on: 12/05/2022 05:53 PM   Modules accepted: Orders

## 2022-12-05 NOTE — Telephone Encounter (Signed)
Pt notified & BP check & lab appt scheduled on 12/15/22. Medication was refilled earlier today.

## 2022-12-15 ENCOUNTER — Ambulatory Visit (INDEPENDENT_AMBULATORY_CARE_PROVIDER_SITE_OTHER): Payer: Medicare PPO

## 2022-12-15 DIAGNOSIS — I10 Essential (primary) hypertension: Secondary | ICD-10-CM | POA: Diagnosis not present

## 2022-12-15 LAB — BASIC METABOLIC PANEL
BUN: 28 mg/dL — ABNORMAL HIGH (ref 6–23)
CO2: 30 mEq/L (ref 19–32)
Calcium: 9.7 mg/dL (ref 8.4–10.5)
Chloride: 101 mEq/L (ref 96–112)
Creatinine, Ser: 1.25 mg/dL (ref 0.40–1.50)
GFR: 55.99 mL/min — ABNORMAL LOW (ref >60.00)
Glucose, Bld: 85 mg/dL (ref 70–99)
Potassium: 3.8 mEq/L (ref 3.5–5.1)
Sodium: 139 mEq/L (ref 135–145)

## 2022-12-15 NOTE — Progress Notes (Signed)
Pt presented in office for BP check. I checked his BP in his left arm and it was 144/98 O2 was 97 Hr was 68. I had pt relax for about 10 minutes and I rechecked it in his left arm and it was 138/88 O2 was 97 and HR was 67. Pt did not show any signs of HA or dizziness. Pt was encouraged to keep a check on his bp

## 2022-12-18 ENCOUNTER — Other Ambulatory Visit: Payer: Self-pay | Admitting: Internal Medicine

## 2022-12-18 DIAGNOSIS — R944 Abnormal results of kidney function studies: Secondary | ICD-10-CM

## 2023-01-02 ENCOUNTER — Telehealth: Payer: Self-pay | Admitting: Internal Medicine

## 2023-01-02 ENCOUNTER — Ambulatory Visit (INDEPENDENT_AMBULATORY_CARE_PROVIDER_SITE_OTHER): Payer: Medicare PPO

## 2023-01-02 DIAGNOSIS — E538 Deficiency of other specified B group vitamins: Secondary | ICD-10-CM | POA: Diagnosis not present

## 2023-01-02 MED ORDER — CYANOCOBALAMIN 1000 MCG/ML IJ SOLN
1000.0000 ug | Freq: Once | INTRAMUSCULAR | Status: AC
  Administered 2023-01-02: 1000 ug via INTRAMUSCULAR

## 2023-01-02 MED ORDER — LOSARTAN POTASSIUM-HCTZ 50-12.5 MG PO TABS: 2.0000 | ORAL_TABLET | Freq: Every day | ORAL | 3 refills | Status: DC

## 2023-01-02 NOTE — Telephone Encounter (Signed)
Medication has been refilled and pt is aware.  

## 2023-01-02 NOTE — Telephone Encounter (Signed)
Prescription Request Pt will like to speak to Barron concerning previous med. He will like a phone call 01/02/2023  LOV: 09/21/2022  What is the name of the medication or equipment? losartan-hydrochlorothiazide (HYZAAR) 50-12.5 MG tablet  Have you contacted your pharmacy to request a refill? Yes   Which pharmacy would you like this sent to?   [facility][id number] [place] [address] [place] [address] AT [facility] OF [address] [place] [address] Phone: [phone] Fax: [phone]    Patient notified that their request is being sent to the clinical staff for review and that they should receive a response within 2 business days.   Please advise at [facility] [phone]   As per pt, because the dosage has increase, he will not have enough for 90 days, they will finish sooner.

## 2023-01-02 NOTE — Progress Notes (Signed)
Patient arrived for a B12 injection and it was administered into his right deltoid. Patient tolerated the injection well and did not show any signs of distress or voice any concerns.

## 2023-01-23 MED ORDER — LOSARTAN POTASSIUM-HCTZ 50-12.5 MG PO TABS: 2.0000 | ORAL_TABLET | Freq: Every day | ORAL | 3 refills | Status: DC

## 2023-01-23 NOTE — Addendum Note (Signed)
Addended by: Sandy Salaam on: 01/23/2023 12:23 PM   Modules accepted: Orders

## 2023-01-23 NOTE — Telephone Encounter (Signed)
Quantity was corrected and rx was sent. Pt is aware.

## 2023-01-23 NOTE — Telephone Encounter (Signed)
Patient's wife called. Dr Darrick Huntsman only refill his medication for 90 pills and he takes 2 pills. Patient will run out.

## 2023-02-06 ENCOUNTER — Ambulatory Visit (INDEPENDENT_AMBULATORY_CARE_PROVIDER_SITE_OTHER): Payer: Medicare PPO | Admitting: *Deleted

## 2023-02-06 DIAGNOSIS — E538 Deficiency of other specified B group vitamins: Secondary | ICD-10-CM | POA: Diagnosis not present

## 2023-02-06 MED ORDER — CYANOCOBALAMIN 1000 MCG/ML IJ SOLN
1000.0000 ug | Freq: Once | INTRAMUSCULAR | Status: AC
Start: 2023-02-06 — End: 2023-02-06
  Administered 2023-02-06: 1000 ug via INTRAMUSCULAR

## 2023-02-06 NOTE — Progress Notes (Signed)
Patient received B12 injection in left arm. Tolerated it well with no complaints or concerns.

## 2023-03-02 ENCOUNTER — Telehealth: Payer: Self-pay

## 2023-03-02 NOTE — Telephone Encounter (Signed)
Pt's wife stated that she would call back to schedule an appointment.

## 2023-03-02 NOTE — Telephone Encounter (Signed)
Spoke with pt's wife and she stated that the medication they are requesting a refill on is the Alprazolam that has not been refilled in 10 years. I advised the wife that pt will need to schedule an appt to discuss the need for a refill since it has been 10 years and pt is due for a 6 month follow up. Wife stated that she will have to call back to schedule an appointment.

## 2023-03-02 NOTE — Telephone Encounter (Signed)
Prescription Request  03/02/2023  LOV: Visit date not found  What is the name of the medication or equipment? Medication for anxiety attacks, she is not sure of the name   Have you contacted your pharmacy to request a refill? No   Which pharmacy would you like this sent to?  [facility] DR[facility] #[id number] [place], [place] - [address] AT [facility] OF [address] & [address] [place] [address] Phone: [phone] Fax: [phone]    Patient notified that their request is being sent to the clinical staff for review and that they should receive a response within 2 business days.   Please advise at Mobile [phone] (mobile)  Amy states patient's medication he has left is ten years old.

## 2023-03-13 ENCOUNTER — Ambulatory Visit (INDEPENDENT_AMBULATORY_CARE_PROVIDER_SITE_OTHER): Payer: Medicare PPO

## 2023-03-13 DIAGNOSIS — E538 Deficiency of other specified B group vitamins: Secondary | ICD-10-CM

## 2023-03-13 MED ORDER — CYANOCOBALAMIN 1000 MCG/ML IJ SOLN
1000.0000 ug | Freq: Once | INTRAMUSCULAR | Status: AC
Start: 2023-03-13 — End: 2023-03-13
  Administered 2023-03-13: 1000 ug via INTRAMUSCULAR

## 2023-03-13 NOTE — Progress Notes (Signed)
Pt presented for their vitamin B12 injection. Pt was identified through two identifiers. Pt tolerated shot well in their right deltoid.  

## 2023-04-13 ENCOUNTER — Ambulatory Visit (INDEPENDENT_AMBULATORY_CARE_PROVIDER_SITE_OTHER): Payer: Medicare PPO

## 2023-04-13 DIAGNOSIS — E538 Deficiency of other specified B group vitamins: Secondary | ICD-10-CM | POA: Diagnosis not present

## 2023-04-13 MED ORDER — CYANOCOBALAMIN 1000 MCG/ML IJ SOLN
1000.0000 ug | Freq: Once | INTRAMUSCULAR | Status: AC
Start: 2023-04-13 — End: 2023-04-13
  Administered 2023-04-13: 1000 ug via INTRAMUSCULAR

## 2023-04-13 NOTE — Progress Notes (Signed)
Per Dr. Darrick Huntsman a B12 injection was administered into his right deltoid. Patient tolerated the injection well.  Patient did not do left arm due to having a bug bite that he had scratched and his arm was a little raw and sore.

## 2023-04-20 ENCOUNTER — Telehealth: Payer: Self-pay | Admitting: Internal Medicine

## 2023-04-20 NOTE — Telephone Encounter (Signed)
LMTCB. Pt will need to schedule an appt to discuss his concerns.

## 2023-04-20 NOTE — Telephone Encounter (Signed)
  [name] just called concerning her husband Austin Duran. She said he is not sleeping at night. And he's having anxiety. And he stays up half the night. She would like for someone to call her. She wants to know what can she do. Her number is [phone].

## 2023-04-25 NOTE — Telephone Encounter (Signed)
 Pt is scheduled for 05/15/2023

## 2023-04-27 ENCOUNTER — Telehealth: Payer: Self-pay | Admitting: Internal Medicine

## 2023-04-27 NOTE — Telephone Encounter (Signed)
LMTCB

## 2023-04-27 NOTE — Telephone Encounter (Signed)
Requesting: Alprazolam Contract: No UDS: No Last Visit: 09/21/2022 Next Visit: 05/15/2023 Last Refill: not sure last one I can see in epic says 2012  Please Advise

## 2023-04-27 NOTE — Telephone Encounter (Signed)
Alprazolam refill denied   he has not been prescribed it in YEARS.  Did he give a reason why he is requesting this,  or is it a refill request from the pharmacy?

## 2023-04-27 NOTE — Telephone Encounter (Signed)
Patient just called and said he is almost out of his medication. The name is Alerazolam .5mg  (Xanax). He said the prescription is not on his MyChart anymore. He said he only has a few more. He doesn't have his next appointment til September 9th. His pharmacy he uses is [facility] #[id number] [facility] - [address] [facility] [address] AT [facility] OF [address], [place] [address] Phone: [phone]  Fax: [phone] DEA #: [id number]  His number is [phone].

## 2023-04-28 MED ORDER — ALPRAZOLAM 0.25 MG PO TABS: 0.2500 mg | ORAL_TABLET | Freq: Two times a day (BID) | ORAL | 0 refills | Status: DC | PRN

## 2023-04-28 NOTE — Telephone Encounter (Signed)
Alprazolam has been refilled for #20 tablets to get him to the appt

## 2023-04-28 NOTE — Telephone Encounter (Signed)
Patient states he is returning our call.  I read message from Dr. Duncan Dull to patient.  Patient states he is about out of this medication.  Patient states he has been taking this prescription as needed and patient states for the last 30 days he has started having panic attacks more frequently.  Patient states he has a lot going on at work and he thinks this may be the cause.  Patient states he has an appointment on 05/15/2023 with Dr. Darrick Huntsman and he would like to know if he can have enough medication to get him through to that appointment.

## 2023-04-28 NOTE — Telephone Encounter (Signed)
Pt was notified.  

## 2023-05-15 ENCOUNTER — Encounter: Payer: Self-pay | Admitting: Internal Medicine

## 2023-05-15 ENCOUNTER — Ambulatory Visit (INDEPENDENT_AMBULATORY_CARE_PROVIDER_SITE_OTHER): Payer: Medicare PPO | Admitting: Internal Medicine

## 2023-05-15 ENCOUNTER — Ambulatory Visit: Payer: Medicare PPO

## 2023-05-15 VITALS — BP 140/82 | HR 70 | Ht 68.0 in | Wt 213.8 lb

## 2023-05-15 DIAGNOSIS — Z23 Encounter for immunization: Secondary | ICD-10-CM

## 2023-05-15 DIAGNOSIS — G4733 Obstructive sleep apnea (adult) (pediatric): Secondary | ICD-10-CM | POA: Diagnosis not present

## 2023-05-15 DIAGNOSIS — E538 Deficiency of other specified B group vitamins: Secondary | ICD-10-CM

## 2023-05-15 DIAGNOSIS — F411 Generalized anxiety disorder: Secondary | ICD-10-CM | POA: Diagnosis not present

## 2023-05-15 DIAGNOSIS — I10 Essential (primary) hypertension: Secondary | ICD-10-CM | POA: Diagnosis not present

## 2023-05-15 MED ORDER — CYANOCOBALAMIN 1000 MCG/ML IJ SOLN
1000.0000 ug | Freq: Once | INTRAMUSCULAR | Status: AC
Start: 2023-05-15 — End: 2023-05-15
  Administered 2023-05-15: 1000 ug via INTRAMUSCULAR

## 2023-05-15 MED ORDER — SERTRALINE HCL 100 MG PO TABS: 100.0000 mg | ORAL_TABLET | Freq: Every day | ORAL | 1 refills | Status: DC

## 2023-05-15 MED ORDER — ALPRAZOLAM 0.25 MG PO TABS: 0.2500 mg | ORAL_TABLET | Freq: Two times a day (BID) | ORAL | 2 refills | Status: AC | PRN

## 2023-05-15 NOTE — Patient Instructions (Signed)
For your anxiety:  1) I am changing the citalopram to sertraline.  Still once daily in the morning  2) alprazolam refilled but the maximum dose is 0.25 mg at bedtime since you have untreated Sleep Apnea and the risk of respiratory failure increases with dose   3) Dont' forget that exercise ( walking counts) is recommended therapy !  Return for comprehensive labs in a few weeks.  4 hour fast is sufficient

## 2023-05-15 NOTE — Assessment & Plan Note (Addendum)
Well controlled on current regimen  based on home readings . Renal function is due , no changes today.

## 2023-05-15 NOTE — Assessment & Plan Note (Signed)
Recurrent, has not had injections in several months.. repeat level was normal.  Continue oral supplementation  Lab Results  Component Value Date   VITAMINB12 437 09/21/2022

## 2023-05-15 NOTE — Assessment & Plan Note (Signed)
Untreated for years due to mask intolerance.  He has poor sleep but remains uninterested in resuming CPAP. Weight loss advised and long term complications of untreated OSA reviewed with patient  

## 2023-05-15 NOTE — Assessment & Plan Note (Addendum)
Advised to start setraline and stop citalopram. Alprazolam refilled at 0.25 mg (max dose for concurrent untreated OSA) return in 1 month

## 2023-05-15 NOTE — Progress Notes (Signed)
Subjective:  Patient ID: Austin Duran, male    DOB: 1946-01-03  Age: 77 y.o. MRN: [id number]  CC: The primary encounter diagnosis was B12 deficiency. Diagnoses of Encounter for immunization, Primary hypertension, Anxiety state, and OSA (obstructive sleep apnea) were also pertinent to this visit.   HPI Austin Duran presents for  Chief Complaint  Patient presents with   Medical Management of Chronic Issues   1) HTN:  using losartan hct and diltiazem home reading  124.76  2) Insomnia/anxiety:  reported by wife who called office in mid August.   Alprazolam 0.  25 mg dose sent in on  August 24 ,  had not taken in 10 years  but used the higher dose 0.5 in the past   3) OSA untreated for years.   4)    Outpatient Medications Prior to Visit  Medication Sig Dispense Refill   Cholecalciferol (VITAMIN D-3 PO) Take 1 tablet by mouth daily.     diltiazem (CARDIZEM CD) 120 MG 24 hr capsule TAKE 1 CAPSULE(120 MG) BY MOUTH DAILY 90 capsule 3   losartan-hydrochlorothiazide (HYZAAR) 50-12.5 MG tablet Take 2 tablets by mouth daily. 180 tablet 3   meloxicam (MOBIC) 15 MG tablet Take 1 tablet (15 mg total) by mouth every other day. 45 tablet 1   ALPRAZolam (XANAX) 0.25 MG tablet Take 1 tablet (0.25 mg total) by mouth 2 (two) times daily as needed for anxiety. 20 tablet 0   citalopram (CELEXA) 20 MG tablet TAKE 1 TABLET(20 MG) BY MOUTH DAILY 90 tablet 3   furosemide (LASIX) 20 MG tablet TAKE 1 TABLET BY MOUTH 1 TO 2 TIMES WEEKLY AS NEEDED FOR INCREASE SHORTNESS OF BREATH/ SWELLING (Patient not taking: Reported on 09/01/2022) 25 tablet 0   No facility-administered medications prior to visit.    Review of Systems;  Patient denies headache, fevers, malaise, unintentional weight loss, skin rash, eye pain, sinus congestion and sinus pain, sore throat, dysphagia,  hemoptysis , cough, dyspnea, wheezing, chest pain, palpitations, orthopnea, edema, abdominal pain, nausea, melena, diarrhea, constipation,  flank pain, dysuria, hematuria, urinary  Frequency, nocturia, numbness, tingling, seizures,  Focal weakness, Loss of consciousness,  Tremor, insomnia, depression, anxiety, and suicidal ideation.      Objective:  BP (!) 140/82   Pulse 70   Ht 5\' 8"  (1.727 m)   Wt 213 lb 12.8 oz (97 kg)   SpO2 97%   BMI 32.51 kg/m   BP Readings from Last 3 Encounters:  05/15/23 (!) 140/82  09/21/22 (!) 142/84  09/01/22 132/70    Wt Readings from Last 3 Encounters:  05/15/23 213 lb 12.8 oz (97 kg)  09/21/22 210 lb 3.2 oz (95.3 kg)  09/01/22 210 lb (95.3 kg)    Physical Exam Vitals reviewed.  Constitutional:      General: He is not in acute distress.    Appearance: Normal appearance. He is normal weight. He is not ill-appearing, toxic-appearing or diaphoretic.  HENT:     Head: Normocephalic.  Eyes:     General: No scleral icterus.       Right eye: No discharge.        Left eye: No discharge.     Conjunctiva/sclera: Conjunctivae normal.  Cardiovascular:     Rate and Rhythm: Normal rate and regular rhythm.     Heart sounds: Normal heart sounds.  Pulmonary:     Effort: Pulmonary effort is normal. No respiratory distress.     Breath sounds: Normal breath sounds.  Musculoskeletal:  General: Normal range of motion.     Cervical back: Normal range of motion.  Skin:    General: Skin is warm and dry.  Neurological:     General: No focal deficit present.     Mental Status: He is alert and oriented to person, place, and time. Mental status is at baseline.  Psychiatric:        Mood and Affect: Mood normal.        Behavior: Behavior normal.        Thought Content: Thought content normal.        Judgment: Judgment normal.     Lab Results  Component Value Date   HGBA1C 5.8 09/21/2022   HGBA1C 5.7 09/13/2021   HGBA1C 5.9 10/21/2019    Lab Results  Component Value Date   CREATININE 1.25 12/15/2022   CREATININE 1.15 10/27/2022   CREATININE 1.08 09/21/2022    Lab Results   Component Value Date   WBC 7.1 09/21/2022   HGB 15.2 09/21/2022   HCT 44.5 09/21/2022   PLT 270.0 09/21/2022   GLUCOSE 85 12/15/2022   CHOL 146 09/21/2022   TRIG 92.0 09/21/2022   HDL 38.80 (L) 09/21/2022   LDLDIRECT 102.0 09/21/2022   LDLCALC 89 09/21/2022   ALT 16 10/27/2022   AST 15 10/27/2022   NA 139 12/15/2022   K 3.8 12/15/2022   CL 101 12/15/2022   CREATININE 1.25 12/15/2022   BUN 28 (H) 12/15/2022   CO2 30 12/15/2022   TSH 1.04 09/21/2022   PSA 1.20 09/13/2021   HGBA1C 5.8 09/21/2022   MICROALBUR <0.7 09/21/2022    No results found.  Assessment & Plan:  .B12 deficiency Assessment & Plan: Recurrent, has not had injections in several months.. repeat level was normal.  Continue oral supplementation  Lab Results  Component Value Date   VITAMINB12 437 09/21/2022      Orders: -     Cyanocobalamin  Encounter for immunization -     Flu Vaccine Trivalent High Dose (Fluad)  Primary hypertension Assessment & Plan: Well controlled on current regimen  based on home readings . Renal function is due , no changes today.   Orders: -     Comprehensive metabolic panel; Future -     Lipid panel; Future  Anxiety state Assessment & Plan: Advised to start setraline and stop citalopram. Alprazolam refilled at 0.25 mg (max dose for concurrent untreated OSA) return in 1 month    OSA (obstructive sleep apnea) Assessment & Plan: Untreated for years due to mask intolerance.  He has poor sleep but remains uninterested in resuming CPAP. Weight loss advised and long term complications of untreated OSA reviewed with patient    Other orders -     Sertraline HCl; Take 1 tablet (100 mg total) by mouth daily.  Dispense: 90 tablet; Refill: 1 -     ALPRAZolam; Take 1 tablet (0.25 mg total) by mouth 2 (two) times daily as needed for anxiety.  Dispense: 60 tablet; Refill: 2     I provided 35 minutes of face-to-face time during this encounter reviewing patient's last visit with  me, patient's  most recent visit with cardiology,  nephrology,  and neurology,  recent surgical and non surgical procedures, previous  labs and imaging studies, counseling on currently addressed issues,  and post visit ordering to diagnostics and therapeutics .   Follow-up: Return in about 3 months (around 08/14/2023).   Sherlene Shams, MD

## 2023-05-25 DIAGNOSIS — M3501 Sicca syndrome with keratoconjunctivitis: Secondary | ICD-10-CM | POA: Diagnosis not present

## 2023-05-25 DIAGNOSIS — H43813 Vitreous degeneration, bilateral: Secondary | ICD-10-CM | POA: Diagnosis not present

## 2023-05-25 DIAGNOSIS — Z961 Presence of intraocular lens: Secondary | ICD-10-CM | POA: Diagnosis not present

## 2023-06-19 ENCOUNTER — Ambulatory Visit: Payer: Medicare PPO

## 2023-06-19 DIAGNOSIS — E538 Deficiency of other specified B group vitamins: Secondary | ICD-10-CM

## 2023-06-19 MED ORDER — CYANOCOBALAMIN 1000 MCG/ML IJ SOLN
1000.0000 ug | Freq: Once | INTRAMUSCULAR | Status: AC
Start: 2023-06-19 — End: 2023-06-19
  Administered 2023-06-19: 1000 ug via INTRAMUSCULAR

## 2023-06-19 NOTE — Progress Notes (Signed)
Patient presented for a B12 injection and it was administered into his left deltoid. Patient tolerated the injection well.

## 2023-07-24 ENCOUNTER — Ambulatory Visit (INDEPENDENT_AMBULATORY_CARE_PROVIDER_SITE_OTHER): Payer: Medicare PPO

## 2023-07-24 DIAGNOSIS — E538 Deficiency of other specified B group vitamins: Secondary | ICD-10-CM | POA: Diagnosis not present

## 2023-07-24 MED ORDER — CYANOCOBALAMIN 1000 MCG/ML IJ SOLN
1000.0000 ug | Freq: Once | INTRAMUSCULAR | Status: AC
Start: 2023-07-24 — End: 2023-07-24
  Administered 2023-07-24: 1000 ug via INTRAMUSCULAR

## 2023-07-24 NOTE — Progress Notes (Signed)
Pt presented for their vitamin B12 injection. Pt was identified through two identifiers. Pt tolerated shot well in their right deltoid.  

## 2023-08-21 ENCOUNTER — Ambulatory Visit: Payer: Medicare PPO

## 2023-08-21 DIAGNOSIS — E538 Deficiency of other specified B group vitamins: Secondary | ICD-10-CM | POA: Diagnosis not present

## 2023-08-21 MED ORDER — CYANOCOBALAMIN 1000 MCG/ML IJ SOLN
1000.0000 ug | Freq: Once | INTRAMUSCULAR | Status: AC
Start: 2023-08-21 — End: 2023-08-21
  Administered 2023-08-21: 1000 ug via INTRAMUSCULAR

## 2023-08-21 NOTE — Progress Notes (Signed)
Pt presented for their vitamin B12 injection. Pt was identified through two identifiers. Pt tolerated shot well in their left  deltoid.  

## 2023-08-25 DIAGNOSIS — I493 Ventricular premature depolarization: Secondary | ICD-10-CM | POA: Insufficient documentation

## 2023-08-26 ENCOUNTER — Other Ambulatory Visit: Payer: Self-pay | Admitting: Internal Medicine

## 2023-09-04 ENCOUNTER — Encounter: Payer: Self-pay | Admitting: Internal Medicine

## 2023-09-04 ENCOUNTER — Ambulatory Visit: Payer: Medicare PPO | Attending: Internal Medicine | Admitting: Internal Medicine

## 2023-09-04 VITALS — BP 130/66 | HR 78 | Ht 68.0 in | Wt 205.0 lb

## 2023-09-04 DIAGNOSIS — I4891 Unspecified atrial fibrillation: Secondary | ICD-10-CM

## 2023-09-04 DIAGNOSIS — I493 Ventricular premature depolarization: Secondary | ICD-10-CM | POA: Diagnosis not present

## 2023-09-04 MED ORDER — DILTIAZEM HCL ER COATED BEADS 120 MG PO CP24: ORAL_CAPSULE | ORAL | 3 refills | Status: DC

## 2023-09-04 NOTE — Patient Instructions (Signed)
Medication Instructions:  Your physician recommends that you continue on your current medications as directed. Please refer to the Current Medication list given to you today.  *If you need a refill on your cardiac medications before your next appointment, please call your pharmacy*   Lab Work: None ordered.  If you have labs (blood work) drawn today and your tests are completely normal, you will receive your results only by: MyChart Message (if you have MyChart) OR A paper copy in the mail If you have any lab test that is abnormal or we need to change your treatment, we will call you to review the results.   Testing/Procedures: None ordered.    Follow-Up: At [facility], you and your health needs are our priority.  As part of our continuing mission to provide you with exceptional heart care, we have created designated Provider Care Teams.  These Care Teams include your primary Cardiologist (physician) and Advanced Practice Providers (APPs -  Physician Assistants and Nurse Practitioners) who all work together to provide you with the care you need, when you need it.  We recommend signing up for the patient portal called "MyChart".  Sign up information is provided on this After Visit Summary.  MyChart is used to connect with patients for Virtual Visits (Telemedicine).  Patients are able to view lab/test results, encounter notes, upcoming appointments, etc.  Non-urgent messages can be sent to your provider as well.   To learn more about what you can do with MyChart, go to [email or web address].    Your next appointment:   12 months with Dr Graciela Husbands  ** Please call and let us know if you would like to start on a SGLT2 medication**

## 2023-09-04 NOTE — Progress Notes (Signed)
Patient Care Team: Sherlene Shams, MD as PCP - General (Internal Medicine)   HPI  Austin Duran is a 77 y.o. male Seen in follow-up for PVCs and atrial fib detected on recent event recorder    1/22 c/o  dyspnea on exertion.  Myoview   negative  No chest pain.  Occasional peripheral edema no recent nocturnal dyspnea orthopnea.  No palpitations or syncope.  Chronic stable dyspnea.  This is evaluated as above. [name]  Untreated sleep apnea.   DATE TEST EF   7/18 Echo   55-65   % LVH mild  1/22  MYOVIEW 55-65% Normal Perfusion   Date Cr K Hgb  12//18 0.98 4.1   10/19  1.17 4.2   2/21 1.1 3.9 14.7  1/23 1.1 3.9     Thromboembolic risk factors ( age -38, HTN-1) for a CHADSVASc Score of.  >3  Dx with B12 deficiency + IF  Records and Results Reviewed   Past Medical History:  Diagnosis Date   Depression    Hearing loss    had a perforated right TM,   followed by Dr. Jenne Campus   Hypertension    Panic attacks    PVC's (premature ventricular contractions)     Past Surgical History:  Procedure Laterality Date   CATARACT EXTRACTION Bilateral Aug 2013   Porfilio   CATARACT EXTRACTION, BILATERAL  May 2013   Porfilio   EYE SURGERY  2008   eyelid surgery, Dr. Kemper Durie   FRACTURE SURGERY     TYMPANIC MEMBRANE REPAIR     [facility] , right     Current Meds  Medication Sig   ALPRAZolam (XANAX) 0.25 MG tablet Take 1 tablet (0.25 mg total) by mouth 2 (two) times daily as needed for anxiety.   Cholecalciferol (VITAMIN D-3 PO) Take 1 tablet by mouth daily.   diltiazem (CARDIZEM CD) 120 MG 24 hr capsule TAKE 1 CAPSULE(120 MG) BY MOUTH DAILY   furosemide (LASIX) 20 MG tablet TAKE 1 TABLET BY MOUTH 1 TO 2 TIMES WEEKLY AS NEEDED FOR INCREASE SHORTNESS OF BREATH/ SWELLING   losartan-hydrochlorothiazide (HYZAAR) 50-12.5 MG tablet Take 2 tablets by mouth daily.   meloxicam (MOBIC) 15 MG tablet Take 1 tablet (15 mg total) by mouth every other day.   sertraline (ZOLOFT) 100 MG tablet  Take 1 tablet (100 mg total) by mouth daily.    Allergies  Allergen Reactions   Augmentin [Amoxicillin-Pot Clavulanate] Nausea Only   Lisinopril Cough      Review of Systems negative except from HPI and PMH  Physical Exam BP 130/66 (BP Location: Left Arm, Patient Position: Sitting, Cuff Size: Normal)   Pulse 78   Ht 5\' 8"  (1.727 m)   Wt 205 lb (93 kg)   SpO2 97%   BMI 31.17 kg/m  Well developed and nourished in no acute distress HENT normal Neck supple with JVP-8 Clear Regular rate and rhythm, no murmurs or gallops Abd-soft with active BS No Clubbing cyanosis tr edema Skin-warm and dry A & Oriented  Grossly normal sensory and motor function  ECG sinus at 78 Interval 16/09/38  Assessment and  Plan  Dyspnea on exertion/HFpEF  PVCs-RVOT-exercise associated   Hypertension with hypertensive heart disease   Obesity-BMI greater than 30  Atrial Fib SCAF   No interval palps, continue dilt  BP well controlled on losartan HCT.  However, with his HFpEF, have suggested that he consider an SGLT2 with its 4% absolute risk reduction of  hospitalization over 1 year.  He will consider this.  Would also anticipate discontinuing his losartan and using spironolactone in its stead.  Also discussed his sleep apnea therapy, he is not inclined towards a mask but gave him a review of a sleep apnea pillow

## 2023-09-08 DIAGNOSIS — J069 Acute upper respiratory infection, unspecified: Secondary | ICD-10-CM | POA: Diagnosis not present

## 2023-09-25 ENCOUNTER — Ambulatory Visit: Payer: Medicare PPO

## 2023-09-25 DIAGNOSIS — E538 Deficiency of other specified B group vitamins: Secondary | ICD-10-CM

## 2023-09-25 MED ORDER — CYANOCOBALAMIN 1000 MCG/ML IJ SOLN
1000.0000 ug | Freq: Once | INTRAMUSCULAR | Status: AC
Start: 2023-09-25 — End: 2023-09-25
  Administered 2023-09-25: 1000 ug via INTRAMUSCULAR

## 2023-09-25 NOTE — Progress Notes (Signed)
Pt presented for their vitamin B12 injection. Pt was identified through two identifiers. Pt tolerated shot well in their right deltoid.  

## 2023-10-20 ENCOUNTER — Ambulatory Visit: Payer: Medicare PPO | Admitting: *Deleted

## 2023-10-20 VITALS — Ht 68.0 in | Wt 200.0 lb

## 2023-10-20 DIAGNOSIS — Z0000 Encounter for general adult medical examination without abnormal findings: Secondary | ICD-10-CM | POA: Diagnosis not present

## 2023-10-20 NOTE — Patient Instructions (Signed)
Austin Duran , Thank you for taking time to come for your Medicare Wellness Visit. I appreciate your ongoing commitment to your health goals. Please review the following plan we discussed and let me know if I can assist you in the future.   Referrals/Orders/Follow-Ups/Clinician Recommendations: None  This is a list of the screening recommended for you and due dates:  Health Maintenance  Topic Date Due   Medicare Annual Wellness Visit  10/19/2024   DTaP/Tdap/Td vaccine (3 - Td or Tdap) 02/14/2026   Pneumonia Vaccine  Completed   Flu Shot  Completed   Hepatitis C Screening  Completed   Zoster (Shingles) Vaccine  Completed   HPV Vaccine  Aged Out   Colon Cancer Screening  Discontinued   COVID-19 Vaccine  Discontinued   Cologuard (Stool DNA test)  Discontinued    Advanced directives: (Declined) Advance directive discussed with you today. Even though you declined this today, please call our office should you change your mind, and we can give you the proper paperwork for you to fill out.  /Next Medicare Annual Wellness Visit scheduled for next year: Yes  /

## 2023-10-20 NOTE — Progress Notes (Signed)
 Subjective:   Austin Duran is a 78 y.o. male who presents for Medicare Annual/Subsequent preventive examination.  Visit Complete: Virtual I connected with  Austin Duran on 10/20/23 by a audio enabled telemedicine application and verified that I am speaking with the correct person using two identifiers. This patient declined Interactive audio and Acupuncturist. Therefore the visit was completed with audio only.   Patient Location: Other:  at work  Provider Location: Home Office  I discussed the limitations of evaluation and management by telemedicine. The patient expressed understanding and agreed to proceed.  Vital Signs: Because this visit was a virtual/telehealth visit, some criteria may be missing or patient reported. Any vitals not documented were not able to be obtained and vitals that have been documented are patient reported.   Cardiac Risk Factors include: advanced age (>67men, >62 women);obesity (BMI >30kg/m2);male gender;hypertension     Objective:    Today's Vitals   10/20/23 1049  Weight: 200 lb (90.7 kg)  Height: 5\' 8"  (1.727 m)   Body mass index is 30.41 kg/m.     10/20/2023   11:00 AM 03/10/2022   11:53 AM 08/14/2021    9:01 AM 07/09/2021    9:30 AM 03/09/2021   12:56 PM 03/06/2020   11:38 AM 03/06/2019   11:33 AM  Advanced Directives  Does Patient Have a Medical Advance Directive? No No No No No Yes No  Type of Careers adviser;Living will   Does patient want to make changes to medical advance directive?     No - Patient declined No - Patient declined   Copy of Healthcare Power of Attorney in Chart?      No - copy requested   Would patient like information on creating a medical advance directive? No - Patient declined No - Patient declined     No - Patient declined    Current Medications (verified) Outpatient Encounter Medications as of 10/20/2023  Medication Sig   ALPRAZolam (XANAX) 0.25 MG tablet Take 1 tablet  (0.25 mg total) by mouth 2 (two) times daily as needed for anxiety.   Cholecalciferol (VITAMIN D-3 PO) Take 1 tablet by mouth daily.   diltiazem (CARDIZEM CD) 120 MG 24 hr capsule TAKE 1 CAPSULE(120 MG) BY MOUTH DAILY   furosemide (LASIX) 20 MG tablet TAKE 1 TABLET BY MOUTH 1 TO 2 TIMES WEEKLY AS NEEDED FOR INCREASE SHORTNESS OF BREATH/ SWELLING   losartan-hydrochlorothiazide (HYZAAR) 50-12.5 MG tablet Take 2 tablets by mouth daily.   meloxicam (MOBIC) 15 MG tablet Take 1 tablet (15 mg total) by mouth every other day.   sertraline (ZOLOFT) 100 MG tablet Take 1 tablet (100 mg total) by mouth daily.   No facility-administered encounter medications on file as of 10/20/2023.    Allergies (verified) Augmentin [amoxicillin-pot clavulanate] and Lisinopril   History: Past Medical History:  Diagnosis Date   Depression    Hearing loss    had a perforated right TM,   followed by Dr. Jenne Campus   Hypertension    Panic attacks    PVC's (premature ventricular contractions)    Past Surgical History:  Procedure Laterality Date   CATARACT EXTRACTION Bilateral Aug 2013   Porfilio   CATARACT EXTRACTION, BILATERAL  May 2013   Porfilio   EYE SURGERY  2008   eyelid surgery, Dr. Kemper Durie   FRACTURE SURGERY     TYMPANIC MEMBRANE REPAIR     [facility] , right  Family History  Problem Relation Age of Onset   Cancer Maternal Aunt        lung   Cancer Paternal Uncle        colon   Social History   Socioeconomic History   Marital status: Married    Spouse name: Not on file   Number of children: Not on file   Years of education: Not on file   Highest education level: Not on file  Occupational History    Comment: works 5 days a week  Tobacco Use   Smoking status: Former    Current packs/day: 0.00    Types: Cigarettes    Quit date: 08/03/2004    Years since quitting: 19.2   Smokeless tobacco: Never  Vaping Use   Vaping status: Never Used  Substance and Sexual Activity   Alcohol use: Yes     Alcohol/week: 5.0 standard drinks of alcohol    Types: 5 Standard drinks or equivalent per week   Drug use: No   Sexual activity: Yes  Other Topics Concern   Not on file  Social History Narrative   Married   Social Drivers of Health   Financial Resource Strain: Low Risk  (10/20/2023)   Overall Financial Resource Strain (CARDIA)    Difficulty of Paying Living Expenses: Not hard at all  Food Insecurity: No Food Insecurity (10/20/2023)   Hunger Vital Sign    Worried About Running Out of Food in the Last Year: Never true    Ran Out of Food in the Last Year: Never true  Transportation Needs: No Transportation Needs (10/20/2023)   PRAPARE - Administrator, Civil Service (Medical): No    Lack of Transportation (Non-Medical): No  Physical Activity: Inactive (10/20/2023)   Exercise Vital Sign    Days of Exercise per Week: 0 days    Minutes of Exercise per Session: 0 min  Stress: No Stress Concern Present (10/20/2023)   [employer] of Occupational Health - Occupational Stress Questionnaire    Feeling of Stress : Not at all  Social Connections: Moderately Isolated (10/20/2023)   Social Connection and Isolation Panel [NHANES]    Frequency of Communication with Friends and Family: More than three times a week    Frequency of Social Gatherings with Friends and Family: More than three times a week    Attends Religious Services: Never    Database administrator or Organizations: No    Attends Engineer, structural: Never    Marital Status: Married    Tobacco Counseling Counseling given: Not Answered   Clinical Intake:  Pre-visit preparation completed: Yes  Pain : No/denies pain     BMI - recorded: 30.41 Nutritional Status: BMI > 30  Obese Nutritional Risks: None Diabetes: No  How often do you need to have someone help you when you read instructions, pamphlets, or other written materials from your doctor or pharmacy?: 1 - Never  Interpreter Needed?:  No  Information entered by :: R. Sabeen Piechocki LPN   Activities of Daily Living    10/20/2023   10:50 AM  In your present state of health, do you have any difficulty performing the following activities:  Hearing? 1  Comment wears aids  Vision? 0  Comment readers  Difficulty concentrating or making decisions? 0  Walking or climbing stairs? 0  Dressing or bathing? 0  Doing errands, shopping? 0  Preparing Food and eating ? N  Using the Toilet? N  In the past six  months, have you accidently leaked urine? N  Do you have problems with loss of bowel control? N  Managing your Medications? N  Managing your Finances? N  Housekeeping or managing your Housekeeping? N    Patient Care Team: Sherlene Shams, MD as PCP - General (Internal Medicine) Duke Salvia, MD as Consulting Physician (Cardiology)  Indicate any recent Medical Services you may have received from other than [facility] providers in the past year (date may be approximate).     Assessment:   This is a routine wellness examination for [place].  Hearing/Vision screen Hearing Screening - Comments:: Wears aids Vision Screening - Comments:: readers   Goals Addressed             This Visit's Progress    Patient Stated       Wants to exercise more       Depression Screen    10/20/2023   10:56 AM 05/15/2023    4:26 PM 09/21/2022   11:05 AM 03/10/2022   11:53 AM 03/09/2021   12:55 PM 03/06/2020   11:39 AM 03/06/2019   11:34 AM  PHQ 2/9 Scores  PHQ - 2 Score 0 0 0 0 0 0 0  PHQ- 9 Score 1  0        Fall Risk    10/20/2023   10:53 AM 05/15/2023    4:26 PM 09/21/2022   11:05 AM 03/10/2022   11:59 AM 03/09/2021   12:58 PM  Fall Risk   Falls in the past year? 1 0 0 0 0  Number falls in past yr: 0 0 0  0  Injury with Fall? 0 0 0  0  Risk for fall due to : History of fall(s);Impaired balance/gait No Fall Risks No Fall Risks    Follow up Falls evaluation completed;Falls prevention discussed Falls evaluation completed Falls evaluation  completed Falls evaluation completed Falls evaluation completed    MEDICARE RISK AT HOME: Medicare Risk at Home Any stairs in or around the home?: Yes If so, are there any without handrails?: No Home free of loose throw rugs in walkways, pet beds, electrical cords, etc?: Yes Adequate lighting in your home to reduce risk of falls?: Yes Life alert?: No Use of a cane, walker or w/c?: No Grab bars in the bathroom?: Yes Shower chair or bench in shower?: Yes Elevated toilet seat or a handicapped toilet?: Yes  Cognitive Function:    03/06/2020   11:55 AM 07/15/2016   10:18 AM 07/23/2015    3:47 PM  MMSE - Mini Mental State Exam  Not completed: Unable to complete    Orientation to time  5 5  Orientation to Place  5 5  Registration  3 3  Attention/ Calculation  5 5  Recall  3 3  Language- name 2 objects  2 2  Language- repeat  1 1  Language- follow 3 step command  3 3  Language- read & follow direction  1 1  Write a sentence  1 1  Copy design  1 1  Total score  30 30        10/20/2023   11:00 AM 03/10/2022   12:02 PM 03/09/2021   12:58 PM 03/06/2019   11:35 AM 07/18/2017   10:41 AM  6CIT Screen  What Year? 0 points 0 points 0 points 0 points 0 points  What month? 0 points 0 points 0 points 0 points 0 points  What time? 0 points 0 points 0 points  0 points 0 points  Count back from 20 0 points 0 points 0 points 0 points 0 points  Months in reverse 0 points 0 points 0 points 0 points 0 points  Repeat phrase 0 points 0 points 0 points 0 points 0 points  Total Score 0 points 0 points 0 points 0 points 0 points    Immunizations Immunization History  Administered Date(s) Administered   Fluad Quad(high Dose 65+) 07/28/2020, 07/21/2021   Fluad Trivalent(High Dose 65+) 05/15/2023   Influenza Split 08/04/2011, 07/04/2012, 06/15/2015   Influenza, High Dose Seasonal PF 07/02/2013, 07/18/2017, 05/11/2019   Influenza,inj,Quad PF,6+ Mos 07/10/2014, 04/26/2016   Influenza,inj,quad, With  Preservative 06/13/2018   Influenza-Unspecified 06/28/2022   Pneumococcal Conjugate-13 07/02/2013   Pneumococcal Polysaccharide-23 07/10/2014, 08/08/2016   Pneumococcal-Unspecified 07/10/2009   Tdap 08/04/2011, 02/15/2016   Zoster Recombinant(Shingrix) 06/28/2022, 09/02/2022   Zoster, Live 07/26/2012    TDAP status: Up to date  Flu Vaccine status: Up to date  Pneumococcal vaccine status: Up to date  Covid-19 vaccine status: Information provided on how to obtain vaccines.   Qualifies for Shingles Vaccine? Yes   Zostavax completed Yes   Shingrix Completed?: Yes  Screening Tests Health Maintenance  Topic Date Due   Medicare Annual Wellness (AWV)  09/22/2023   DTaP/Tdap/Td (3 - Td or Tdap) 02/14/2026   Pneumonia Vaccine 1+ Years old  Completed   INFLUENZA VACCINE  Completed   Hepatitis C Screening  Completed   Zoster Vaccines- Shingrix  Completed   HPV VACCINES  Aged Out   Colonoscopy  Discontinued   COVID-19 Vaccine  Discontinued   Fecal DNA (Cologuard)  Discontinued    Health Maintenance  Health Maintenance Due  Topic Date Due   Medicare Annual Wellness (AWV)  09/22/2023    Colorectal cancer screening: Type of screening: Cologuard. Completed 10/2022. Repeat every 3 years  Lung Cancer Screening: (Low Dose CT Chest recommended if Age 71-80 years, 20 pack-year currently smoking OR have quit w/in 15years.) does not qualify.     Additional Screening:  Hepatitis C Screening: does qualify; Completed 07/2015  Vision Screening: Recommended annual ophthalmology exams for early detection of glaucoma and other disorders of the eye. Is the patient up to date with their annual eye exam?  Yes  Who is the provider or what is the name of the office in which the patient attends annual eye exams? [facility] If pt is not established with a provider, would they like to be referred to a provider to establish care? No .   Dental Screening: Recommended annual dental exams for  proper oral hygiene   Community Resource Referral / Chronic Care Management: CRR required this visit?  No   CCM required this visit?  No     Plan:     I have personally reviewed and noted the following in the patient's chart:   Medical and social history Use of alcohol, tobacco or illicit drugs  Current medications and supplements including opioid prescriptions. Patient is not currently taking opioid prescriptions. Functional ability and status Nutritional status Physical activity Advanced directives List of other physicians Hospitalizations, surgeries, and ER visits in previous 12 months Vitals Screenings to include cognitive, depression, and falls Referrals and appointments  In addition, I have reviewed and discussed with patient certain preventive protocols, quality metrics, and best practice recommendations. A written personalized care plan for preventive services as well as general preventive health recommendations were provided to patient.     Sydell Axon, [place]   1/61/0960  After Visit Summary: (Pick Up) Due to this being a telephonic visit, with patients personalized plan was offered to patient and patient has requested to Pick up at office.  Nurse Notes: None

## 2023-10-23 ENCOUNTER — Ambulatory Visit (INDEPENDENT_AMBULATORY_CARE_PROVIDER_SITE_OTHER): Payer: Medicare PPO

## 2023-10-23 DIAGNOSIS — E538 Deficiency of other specified B group vitamins: Secondary | ICD-10-CM | POA: Diagnosis not present

## 2023-10-23 MED ORDER — CYANOCOBALAMIN 1000 MCG/ML IJ SOLN
1000.0000 ug | Freq: Once | INTRAMUSCULAR | Status: AC
Start: 2023-10-23 — End: 2023-10-23
  Administered 2023-10-23: 1000 ug via INTRAMUSCULAR

## 2023-10-23 NOTE — Progress Notes (Signed)
 Pt presented for their vitamin B12 injection. Pt was identified through two identifiers. Pt tolerated shot well in their left deltoid.

## 2023-11-19 NOTE — Progress Notes (Unsigned)
Pt received B12 injection in right deltoid muscle. Pt tolerated it well with no complaints or concerns.  

## 2023-11-20 ENCOUNTER — Ambulatory Visit (INDEPENDENT_AMBULATORY_CARE_PROVIDER_SITE_OTHER): Payer: Medicare PPO | Admitting: *Deleted

## 2023-11-20 DIAGNOSIS — E538 Deficiency of other specified B group vitamins: Secondary | ICD-10-CM | POA: Diagnosis not present

## 2023-11-20 MED ORDER — CYANOCOBALAMIN 1000 MCG/ML IJ SOLN
1000.0000 ug | Freq: Once | INTRAMUSCULAR | Status: AC
  Administered 2023-11-20: 1000 ug via INTRAMUSCULAR

## 2023-11-21 ENCOUNTER — Other Ambulatory Visit: Payer: Self-pay | Admitting: Internal Medicine

## 2023-11-29 DIAGNOSIS — H9211 Otorrhea, right ear: Secondary | ICD-10-CM | POA: Diagnosis not present

## 2023-12-13 DIAGNOSIS — H90A31 Mixed conductive and sensorineural hearing loss, unilateral, right ear with restricted hearing on the contralateral side: Secondary | ICD-10-CM | POA: Diagnosis not present

## 2023-12-13 DIAGNOSIS — H9211 Otorrhea, right ear: Secondary | ICD-10-CM | POA: Diagnosis not present

## 2023-12-18 ENCOUNTER — Other Ambulatory Visit: Payer: Self-pay | Admitting: Unknown Physician Specialty

## 2023-12-18 DIAGNOSIS — H72821 Total perforations of tympanic membrane, right ear: Secondary | ICD-10-CM

## 2023-12-20 ENCOUNTER — Ambulatory Visit: Admitting: Internal Medicine

## 2023-12-22 ENCOUNTER — Encounter: Payer: Self-pay | Admitting: Unknown Physician Specialty

## 2023-12-25 ENCOUNTER — Ambulatory Visit (INDEPENDENT_AMBULATORY_CARE_PROVIDER_SITE_OTHER)

## 2023-12-25 ENCOUNTER — Ambulatory Visit
Admission: RE | Admit: 2023-12-25 | Discharge: 2023-12-25 | Disposition: A | Discharge status: Home / Self Care | Source: Ambulatory Visit | Attending: Unknown Physician Specialty | Admitting: Unknown Physician Specialty

## 2023-12-25 DIAGNOSIS — H72821 Total perforations of tympanic membrane, right ear: Secondary | ICD-10-CM | POA: Diagnosis not present

## 2023-12-25 DIAGNOSIS — E538 Deficiency of other specified B group vitamins: Secondary | ICD-10-CM

## 2023-12-25 DIAGNOSIS — H748X2 Other specified disorders of left middle ear and mastoid: Secondary | ICD-10-CM | POA: Diagnosis not present

## 2023-12-25 MED ORDER — CYANOCOBALAMIN 1000 MCG/ML IJ SOLN
1000.0000 ug | Freq: Once | INTRAMUSCULAR | Status: AC
  Administered 2023-12-25: 1000 ug via INTRAMUSCULAR

## 2023-12-25 NOTE — Progress Notes (Signed)
 Pt presented for their vitamin B12 injection. Pt was identified through two identifiers. Pt tolerated shot well in their left deltoid.

## 2024-01-18 DIAGNOSIS — H90A31 Mixed conductive and sensorineural hearing loss, unilateral, right ear with restricted hearing on the contralateral side: Secondary | ICD-10-CM | POA: Diagnosis not present

## 2024-01-18 DIAGNOSIS — H9211 Otorrhea, right ear: Secondary | ICD-10-CM | POA: Diagnosis not present

## 2024-01-22 ENCOUNTER — Ambulatory Visit

## 2024-01-22 DIAGNOSIS — E538 Deficiency of other specified B group vitamins: Secondary | ICD-10-CM | POA: Diagnosis not present

## 2024-01-22 MED ORDER — CYANOCOBALAMIN 1000 MCG/ML IJ SOLN
1000.0000 ug | Freq: Once | INTRAMUSCULAR | Status: AC
  Administered 2024-01-22: 1000 ug via INTRAMUSCULAR

## 2024-01-22 NOTE — Progress Notes (Signed)
 Patient is in office today for a nurse visit for B12 Injection. Patient Injection was given in the  Right deltoid. Patient tolerated injection well.

## 2024-01-26 DIAGNOSIS — H903 Sensorineural hearing loss, bilateral: Secondary | ICD-10-CM | POA: Diagnosis not present

## 2024-02-20 ENCOUNTER — Other Ambulatory Visit: Payer: Self-pay | Admitting: Internal Medicine

## 2024-02-20 NOTE — Telephone Encounter (Signed)
 Copied from CRM [id number]. Topic: Clinical - Medication Refill >> Feb 20, 2024  4:52 PM Peg Bouton F wrote: Medication: losartan -hydrochlorothiazide (HYZAAR) 50-12.5 MG tablet  Has the patient contacted their pharmacy? Yes (Agent: If no, request that the patient contact the pharmacy for the refill. If patient does not wish to contact the pharmacy document the reason why and proceed with request.) (Agent: If yes, when and what did the pharmacy advise?)  This is the patient's preferred pharmacy:  [id number], [place] - [address] AT [facility] [address] [place] [address] Phone: [phone] Fax: [phone]  Is this the correct pharmacy for this prescription? Yes If no, delete pharmacy and type the correct one.   Has the prescription been filled recently? No  Is the patient out of the medication? No  Has the patient been seen for an appointment in the last year OR does the patient have an upcoming appointment? No  Can we respond through MyChart? No  Agent: Please be advised that Rx refills may take up to 3 business days. We ask that you follow-up with your pharmacy.

## 2024-02-21 MED ORDER — LOSARTAN POTASSIUM-HCTZ 50-12.5 MG PO TABS: 2.0000 | ORAL_TABLET | Freq: Every day | ORAL | 0 refills | Status: DC

## 2024-02-22 ENCOUNTER — Ambulatory Visit

## 2024-02-26 ENCOUNTER — Ambulatory Visit (INDEPENDENT_AMBULATORY_CARE_PROVIDER_SITE_OTHER)

## 2024-02-26 DIAGNOSIS — E538 Deficiency of other specified B group vitamins: Secondary | ICD-10-CM

## 2024-02-26 MED ORDER — CYANOCOBALAMIN 1000 MCG/ML IJ SOLN
1000.0000 ug | Freq: Once | INTRAMUSCULAR | Status: AC
  Administered 2024-02-26: 1000 ug via INTRAMUSCULAR

## 2024-02-26 NOTE — Progress Notes (Signed)
 Patient presented for B 12 injection to left deltoid, patient voiced no concerns nor showed any signs of distress during injection.

## 2024-03-14 ENCOUNTER — Ambulatory Visit: Payer: Medicare PPO | Admitting: Dermatology

## 2024-03-27 ENCOUNTER — Ambulatory Visit

## 2024-03-27 DIAGNOSIS — E538 Deficiency of other specified B group vitamins: Secondary | ICD-10-CM | POA: Diagnosis not present

## 2024-03-27 MED ORDER — CYANOCOBALAMIN 1000 MCG/ML IJ SOLN
1000.0000 ug | Freq: Once | INTRAMUSCULAR | Status: AC
  Administered 2024-03-27: 1000 ug via INTRAMUSCULAR

## 2024-03-27 NOTE — Progress Notes (Signed)
 Patient was administered a B12i njection into his right deltoid. Patient tolerated the b12 injection well.

## 2024-03-28 ENCOUNTER — Ambulatory Visit: Admitting: Dermatology

## 2024-03-28 DIAGNOSIS — W908XXA Exposure to other nonionizing radiation, initial encounter: Secondary | ICD-10-CM

## 2024-03-28 DIAGNOSIS — D489 Neoplasm of uncertain behavior, unspecified: Secondary | ICD-10-CM

## 2024-03-28 DIAGNOSIS — D229 Melanocytic nevi, unspecified: Secondary | ICD-10-CM | POA: Diagnosis not present

## 2024-03-28 DIAGNOSIS — Z1283 Encounter for screening for malignant neoplasm of skin: Secondary | ICD-10-CM

## 2024-03-28 DIAGNOSIS — L578 Other skin changes due to chronic exposure to nonionizing radiation: Secondary | ICD-10-CM

## 2024-03-28 DIAGNOSIS — L821 Other seborrheic keratosis: Secondary | ICD-10-CM | POA: Diagnosis not present

## 2024-03-28 DIAGNOSIS — L820 Inflamed seborrheic keratosis: Secondary | ICD-10-CM | POA: Diagnosis not present

## 2024-03-28 DIAGNOSIS — D492 Neoplasm of unspecified behavior of bone, soft tissue, and skin: Secondary | ICD-10-CM | POA: Diagnosis not present

## 2024-03-28 DIAGNOSIS — L814 Other melanin hyperpigmentation: Secondary | ICD-10-CM

## 2024-03-28 DIAGNOSIS — Z85828 Personal history of other malignant neoplasm of skin: Secondary | ICD-10-CM

## 2024-03-28 DIAGNOSIS — Z86018 Personal history of other benign neoplasm: Secondary | ICD-10-CM

## 2024-03-28 NOTE — Progress Notes (Signed)
 New Patient Visit   Subjective  Austin Duran is a 78 y.o. male who presents for the following: Skin Cancer Screening and Full Body Skin Exam Hx of bcc, hx of dysplastic nevi,   Reports a spot behind his left ear he would like treated  The patient presents for Total-Body Skin Exam (TBSE) for skin cancer screening and mole check. The patient has spots, moles and lesions to be evaluated, some may be new or changing and the patient may have concern these could be cancer.    The following portions of the chart were reviewed this encounter and updated as appropriate: medications, allergies, medical history  Review of Systems:  No other skin or systemic complaints except as noted in HPI or Assessment and Plan.  Objective  Well appearing patient in no apparent distress; mood and affect are within normal limits.  A full examination was performed including scalp, head, eyes, ears, nose, lips, neck, chest, axillae, abdomen, back, buttocks, bilateral upper extremities, bilateral lower extremities, hands, feet, fingers, toes, fingernails, and toenails. All findings within normal limits unless otherwise noted below.   Relevant physical exam findings are noted in the Assessment and Plan.  face x 17 (17) Erythematous stuck-on, waxy papule or plaque left proximal mandible 1.1 cm mammillated papule    Assessment & Plan   SKIN CANCER SCREENING PERFORMED TODAY.  ACTINIC DAMAGE - Chronic condition, secondary to cumulative UV/sun exposure - diffuse scaly erythematous macules with underlying dyspigmentation - Recommend daily broad spectrum sunscreen SPF 30+ to sun-exposed areas, reapply every 2 hours as needed.  - Staying in the shade or wearing long sleeves, sun glasses (UVA+UVB protection) and wide brim hats (4-inch brim around the entire circumference of the hat) are also recommended for sun protection.  - Call for new or changing lesions.  LENTIGINES, SEBORRHEIC KERATOSES, HEMANGIOMAS -  Benign normal skin lesions - Benign-appearing - Call for any changes  MELANOCYTIC NEVI - Tan-brown and/or pink-flesh-colored symmetric macules and papules - Benign appearing on exam today - Observation - Call clinic for new or changing moles - Recommend daily use of broad spectrum spf 30+ sunscreen to sun-exposed areas.    HISTORY OF BASAL CELL CARCINOMA OF THE SKIN Upper Back -  2010 Pathology scanned into media - No evidence of recurrence today - Recommend regular full body skin exams - Recommend daily broad spectrum sunscreen SPF 30+ to sun-exposed areas, reapply every 2 hours as needed.  - Call if any new or changing lesions are noted between office visits  HISTORY OF DYSPLASTIC NEVUS 08/14/2008 Right shoulder and abdomen - both with slight melanocytic atypia  Pathology scanned into media  No evidence of recurrence today Recommend regular full body skin exams Recommend daily broad spectrum sunscreen SPF 30+ to sun-exposed areas, reapply every 2 hours as needed.  Call if any new or changing lesions are noted between office visits      INFLAMED SEBORRHEIC KERATOSIS (17) face x 17 (17) Symptomatic, irritating, patient would like treated.  Vs viral warts  Destruction of lesion - face x 17 (17) Complexity: simple   Destruction method: cryotherapy   Informed consent: discussed and consent obtained   Timeout:  patient name, date of birth, surgical site, and procedure verified Lesion destroyed using liquid nitrogen: Yes   Region frozen until ice ball extended beyond lesion: Yes   Outcome: patient tolerated procedure well with no complications   Post-procedure details: wound care instructions given    NEOPLASM OF UNCERTAIN BEHAVIOR left proximal  mandible Epidermal / dermal shaving  Lesion diameter (cm):  1.1 Informed consent: discussed and consent obtained   Timeout: patient name, date of birth, surgical site, and procedure verified   Procedure prep:  Patient was  prepped and draped in usual sterile fashion Prep type:  Isopropyl alcohol Anesthesia: the lesion was anesthetized in a standard fashion   Anesthetic:  1% lidocaine w/ epinephrine 1-100,000 buffered w/ 8.4% NaHCO3 Instrument used: scissors   Hemostasis achieved with: pressure, aluminum chloride and electrodesiccation   Outcome: patient tolerated procedure well   Post-procedure details: sterile dressing applied and wound care instructions given   Dressing type: bandage and petrolatum    Specimen 1 - Surgical pathology Differential Diagnosis: isk r/o ca   Check Margins: yes Isk r/o ca   Return in about 1 year (around 03/28/2025) for TBSE.  IEleanor Blush, CMA, am acting as scribe for Alm Rhyme, MD.   Documentation: I have reviewed the above documentation for accuracy and completeness, and I agree with the above.  Alm Rhyme, MD

## 2024-03-28 NOTE — Patient Instructions (Addendum)

## 2024-04-01 LAB — SURGICAL PATHOLOGY

## 2024-04-02 ENCOUNTER — Encounter: Payer: Self-pay | Admitting: Dermatology

## 2024-04-02 ENCOUNTER — Ambulatory Visit: Payer: Self-pay | Admitting: Dermatology

## 2024-04-02 NOTE — Telephone Encounter (Addendum)
 Called and discussed bx results with patient. He verbalized understanding and denied further questions. ----- Message from Austin Duran sent at 04/02/2024 10:13 AM EDT ----- FINAL DIAGNOSIS        1. Skin, left proximal mandible :       SEBORRHEIC KERATOSIS, INFLAMED   Benign inflamed keratosis No further treatment needed  ----- Message ----- From: Interface, Lab In Three Zero One Sent: 04/01/2024   5:17 PM EDT To: Austin JAYSON Rhyme, MD

## 2024-04-11 DIAGNOSIS — H7201 Central perforation of tympanic membrane, right ear: Secondary | ICD-10-CM | POA: Diagnosis not present

## 2024-04-29 ENCOUNTER — Ambulatory Visit

## 2024-04-29 ENCOUNTER — Ambulatory Visit (INDEPENDENT_AMBULATORY_CARE_PROVIDER_SITE_OTHER)

## 2024-04-29 DIAGNOSIS — E538 Deficiency of other specified B group vitamins: Secondary | ICD-10-CM

## 2024-04-29 MED ORDER — CYANOCOBALAMIN 1000 MCG/ML IJ SOLN
1000.0000 ug | Freq: Once | INTRAMUSCULAR | Status: AC
  Administered 2024-04-29: 1000 ug via INTRAMUSCULAR

## 2024-04-29 NOTE — Progress Notes (Signed)
 Pt presented for their vitamin B12 injection. Pt was identified through two identifiers. Pt tolerated shot well in their left deltoid.

## 2024-05-16 ENCOUNTER — Other Ambulatory Visit: Payer: Self-pay | Admitting: Internal Medicine

## 2024-05-17 ENCOUNTER — Other Ambulatory Visit: Payer: Self-pay | Admitting: Internal Medicine

## 2024-05-17 NOTE — Telephone Encounter (Signed)
 Called pt to confirm if he is still using this medication he stated he doesn't need it anymore you can refuse this medication

## 2024-05-22 ENCOUNTER — Telehealth: Payer: Self-pay

## 2024-05-22 MED ORDER — SERTRALINE HCL 100 MG PO TABS: ORAL_TABLET | ORAL | 1 refills | Status: DC

## 2024-05-22 NOTE — Addendum Note (Signed)
 Addended by: HARRIETTE RAISIN on: 05/22/2024 11:09 AM   Modules accepted: Orders

## 2024-05-22 NOTE — Telephone Encounter (Addendum)
 Spoke with pt's wife to apologize for the miss understanding of what the medication was used for but that we have refilled the medication. Pt's wife stated that pt was advised that the medication was used for nausea and that is why he stated that he did not need the medication any more. I let pt's wife know that the medication is not for nausea it is for depression/anxiety.

## 2024-05-22 NOTE — Telephone Encounter (Signed)
 Copied from CRM [id number]. Topic: Clinical - Medication Question >> May 22, 2024 10:38 AM Mia F wrote: Reason for CRM: Pt wife called to see why pt sertraline  (ZOLOFT ) 100 MG tablet was denied. Message in chart stating pt says he does not need it anymore was relayed. His wife says he does need the medication and he was doing much better with it. Pt was with wife and stated he thought the medication was for nausea. Both agreed that he does need medication. Please contact wife to pt once sent. Please send to [facility] #[id number] [facility], [place] - [address] AT [facility] [address] [place] [address] Phone: [phone] Fax: [phone]

## 2024-05-25 ENCOUNTER — Other Ambulatory Visit: Payer: Self-pay | Admitting: Internal Medicine

## 2024-05-27 DIAGNOSIS — Z961 Presence of intraocular lens: Secondary | ICD-10-CM | POA: Diagnosis not present

## 2024-05-27 DIAGNOSIS — H35039 Hypertensive retinopathy, unspecified eye: Secondary | ICD-10-CM | POA: Diagnosis not present

## 2024-05-27 DIAGNOSIS — M3501 Sicca syndrome with keratoconjunctivitis: Secondary | ICD-10-CM | POA: Diagnosis not present

## 2024-05-27 DIAGNOSIS — H43813 Vitreous degeneration, bilateral: Secondary | ICD-10-CM | POA: Diagnosis not present

## 2024-06-03 ENCOUNTER — Ambulatory Visit (INDEPENDENT_AMBULATORY_CARE_PROVIDER_SITE_OTHER)

## 2024-06-03 DIAGNOSIS — E538 Deficiency of other specified B group vitamins: Secondary | ICD-10-CM | POA: Diagnosis not present

## 2024-06-03 MED ORDER — CYANOCOBALAMIN 1000 MCG/ML IJ SOLN
1000.0000 ug | Freq: Once | INTRAMUSCULAR | Status: AC
  Administered 2024-06-03: 1000 ug via INTRAMUSCULAR

## 2024-06-03 NOTE — Progress Notes (Signed)
 Pt presented for their vitamin B12 injection. Pt was identified through two identifiers. Pt tolerated shot well in their right deltoid.

## 2024-06-27 ENCOUNTER — Ambulatory Visit: Admitting: Internal Medicine

## 2024-06-27 ENCOUNTER — Encounter: Payer: Self-pay | Admitting: Internal Medicine

## 2024-06-27 ENCOUNTER — Other Ambulatory Visit: Payer: Self-pay

## 2024-06-27 VITALS — BP 140/84 | HR 66 | Ht 68.0 in | Wt 202.2 lb

## 2024-06-27 DIAGNOSIS — I5032 Chronic diastolic (congestive) heart failure: Secondary | ICD-10-CM | POA: Diagnosis not present

## 2024-06-27 DIAGNOSIS — Z79899 Other long term (current) drug therapy: Secondary | ICD-10-CM | POA: Diagnosis not present

## 2024-06-27 DIAGNOSIS — H903 Sensorineural hearing loss, bilateral: Secondary | ICD-10-CM

## 2024-06-27 DIAGNOSIS — R7301 Impaired fasting glucose: Secondary | ICD-10-CM | POA: Diagnosis not present

## 2024-06-27 DIAGNOSIS — E785 Hyperlipidemia, unspecified: Secondary | ICD-10-CM | POA: Diagnosis not present

## 2024-06-27 DIAGNOSIS — I10 Essential (primary) hypertension: Secondary | ICD-10-CM

## 2024-06-27 DIAGNOSIS — E538 Deficiency of other specified B group vitamins: Secondary | ICD-10-CM | POA: Diagnosis not present

## 2024-06-27 DIAGNOSIS — Z23 Encounter for immunization: Secondary | ICD-10-CM | POA: Diagnosis not present

## 2024-06-27 DIAGNOSIS — Z0000 Encounter for general adult medical examination without abnormal findings: Secondary | ICD-10-CM | POA: Diagnosis not present

## 2024-06-27 DIAGNOSIS — I4891 Unspecified atrial fibrillation: Secondary | ICD-10-CM | POA: Diagnosis not present

## 2024-06-27 LAB — CBC WITH DIFFERENTIAL/PLATELET
Basophils Absolute: 0 K/uL (ref 0.0–0.1)
Basophils Relative: 0.6 % (ref 0.0–3.0)
Eosinophils Absolute: 0.1 K/uL (ref 0.0–0.7)
Eosinophils Relative: 2 % (ref 0.0–5.0)
HCT: 45.8 % (ref 39.0–52.0)
Hemoglobin: 15.3 g/dL (ref 13.0–17.0)
Lymphocytes Relative: 17.1 % (ref 12.0–46.0)
Lymphs Abs: 1.1 K/uL (ref 0.7–4.0)
MCHC: 33.5 g/dL (ref 30.0–36.0)
MCV: 92.1 fl (ref 78.0–100.0)
Monocytes Absolute: 0.9 K/uL (ref 0.1–1.0)
Monocytes Relative: 13 % — ABNORMAL HIGH (ref 3.0–12.0)
Neutro Abs: 4.5 K/uL (ref 1.4–7.7)
Neutrophils Relative %: 67.3 % (ref 43.0–77.0)
Platelets: 247 K/uL (ref 150.0–400.0)
RBC: 4.97 Mil/uL (ref 4.22–5.81)
RDW: 12.9 % (ref 11.5–15.5)
WBC: 6.7 K/uL (ref 4.0–10.5)

## 2024-06-27 LAB — COMPREHENSIVE METABOLIC PANEL WITH GFR
ALT: 17 U/L (ref 0–53)
AST: 16 U/L (ref 0–37)
Albumin: 4.7 g/dL (ref 3.5–5.2)
Alkaline Phosphatase: 57 U/L (ref 39–117)
BUN: 26 mg/dL — ABNORMAL HIGH (ref 6–23)
CO2: 31 meq/L (ref 19–32)
Calcium: 9.9 mg/dL (ref 8.4–10.5)
Chloride: 101 meq/L (ref 96–112)
Creatinine, Ser: 1.15 mg/dL (ref 0.40–1.50)
GFR: 61.22 mL/min (ref >60.00)
Glucose, Bld: 100 mg/dL — ABNORMAL HIGH (ref 70–99)
Potassium: 4.3 meq/L (ref 3.5–5.1)
Sodium: 140 meq/L (ref 135–145)
Total Bilirubin: 1.6 mg/dL — ABNORMAL HIGH (ref 0.2–1.2)
Total Protein: 6.8 g/dL (ref 6.0–8.3)

## 2024-06-27 LAB — LIPID PANEL
Cholesterol: 153 mg/dL (ref 0–200)
HDL: 34.8 mg/dL — ABNORMAL LOW (ref >39.00)
LDL Cholesterol: 95 mg/dL (ref 0–99)
NonHDL: 118.2
Total CHOL/HDL Ratio: 4
Triglycerides: 116 mg/dL (ref 0.0–149.0)
VLDL: 23.2 mg/dL (ref 0.0–40.0)

## 2024-06-27 LAB — MICROALBUMIN / CREATININE URINE RATIO
Creatinine,U: 124.8 mg/dL
Microalb Creat Ratio: UNDETERMINED mg/g (ref 0.0–30.0)
Microalb, Ur: <0.7 mg/dL

## 2024-06-27 LAB — HEMOGLOBIN A1C: Hgb A1c MFr Bld: 5.7 % (ref 4.6–6.5)

## 2024-06-27 LAB — LDL CHOLESTEROL, DIRECT: Direct LDL: 112 mg/dL

## 2024-06-27 LAB — TSH: TSH: 1.21 u[IU]/mL (ref 0.35–5.50)

## 2024-06-27 MED ORDER — LOSARTAN POTASSIUM-HCTZ 100-25 MG PO TABS: 1.0000 | ORAL_TABLET | Freq: Every day | ORAL | 1 refills | Status: DC

## 2024-06-27 MED ORDER — AMLODIPINE BESYLATE 2.5 MG PO TABS: 2.5000 mg | ORAL_TABLET | Freq: Every day | ORAL | 1 refills | Status: DC

## 2024-06-27 MED ORDER — SPIRONOLACTONE 50 MG PO TABS: 50.0000 mg | ORAL_TABLET | Freq: Every day | ORAL | 1 refills | Status: AC

## 2024-06-27 MED ORDER — EMPAGLIFLOZIN 10 MG PO TABS: 10.0000 mg | ORAL_TABLET | Freq: Every day | ORAL | 2 refills | Status: DC

## 2024-06-27 NOTE — Progress Notes (Signed)
 Patient ID: Austin Duran, male    DOB: [birth year]  Age: 78 y.o. MRN: [id number]  The patient is here for annual preventive examination and management of other chronic and acute problems.   The risk factors are reflected in the social history.   The roster of all physicians providing medical care to patient - is listed in the Snapshot section of the chart.   Activities of daily living:  The patient is 100% independent in all ADLs: dressing, toileting, feeding as well as independent mobility   Home safety : The patient has smoke detectors in the home. They wear seatbelts.  There are no unsecured firearms at home. There is no violence in the home.    There is no risks for hepatitis, STDs or HIV. There is no   history of blood transfusion. They have no travel history to infectious disease endemic areas of the world.   The patient has seen their dentist in the last six month. They have seen their eye doctor in the last year. The patient has hearing loss and recently tried a hearing aid which occluded his right calan anc dcause otitis media due to his history of perforated eardrum  with regard to whispered voices and some television programs.  They have deferred audiologic testing in the last year.  They do not  have excessive sun exposure. Discussed the need for sun protection: hats, long sleeves and use of sunscreen if there is significant sun exposure.    Diet: the importance of a healthy diet is discussed. They do have a healthy diet.   The benefits of regular aerobic exercise were discussed. The patient  walks 3  to 5 days per week  for  30 minutes.    Depression screen: there are no signs or vegative symptoms of depression- irritability, change in appetite, anhedonia, sadness/tearfullness.   The following portions of the patient's history were reviewed and updated as appropriate: allergies, current medications, past family history, past medical history,  past surgical history, past social  history  and problem list.   Visual acuity was not assessed per patient preference since the patient has regular follow up with an  ophthalmologist. Hearing and body mass index were assessed and reviewed.    During the course of the visit the patient was educated and counseled about appropriate screening and preventive services including : fall prevention , diabetes screening, nutrition counseling, colorectal cancer screening, and recommended immunizations.    Chief Complaint:  HPI     Annual Exam    Additional comments: Physical       Last edited by Harriette Raisin, CMA on 06/27/2024  9:32 AM.        Review of Symptoms  Patient denies headache, fevers, malaise, unintentional weight loss, skin rash, eye pain, sinus congestion and sinus pain, sore throat, dysphagia,  hemoptysis , cough, dyspnea, wheezing, chest pain, palpitations, orthopnea, edema, abdominal pain, nausea, melena, diarrhea, constipation, flank pain, dysuria, hematuria, urinary  Frequency, nocturia, numbness, tingling, seizures,  Focal weakness, Loss of consciousness,  Tremor, insomnia, depression, anxiety, and suicidal ideation.    Physical Exam:  BP (!) 140/84   Pulse 66   Ht 5' 8 (1.727 m)   Wt 202 lb 3.2 oz (91.7 kg)   SpO2 96%   BMI 30.74 kg/m    Physical Exam Vitals reviewed.  Constitutional:      General: He is not in acute distress.    Appearance: Normal appearance. He is normal weight. He is not  ill-appearing, toxic-appearing or diaphoretic.  HENT:     Head: Normocephalic and atraumatic.     Right Ear: Tympanic membrane, ear canal and external ear normal. There is no impacted cerumen.     Left Ear: Tympanic membrane, ear canal and external ear normal. There is no impacted cerumen.     Nose: Nose normal.     Mouth/Throat:     Mouth: Mucous membranes are moist.     Pharynx: Oropharynx is clear.  Eyes:     General: No scleral icterus.       Right eye: No discharge.        Left eye: No  discharge.     Conjunctiva/sclera: Conjunctivae normal.  Neck:     Thyroid : No thyromegaly.     Vascular: No carotid bruit or JVD.  Cardiovascular:     Rate and Rhythm: Normal rate and regular rhythm.     Heart sounds: Normal heart sounds.  Pulmonary:     Effort: Pulmonary effort is normal. No respiratory distress.     Breath sounds: Normal breath sounds.  Abdominal:     General: Bowel sounds are normal.     Palpations: Abdomen is soft. There is no mass.     Tenderness: There is no abdominal tenderness. There is no guarding or rebound.  Musculoskeletal:        General: Normal range of motion.     Cervical back: Normal range of motion and neck supple.  Lymphadenopathy:     Cervical: No cervical adenopathy.  Skin:    General: Skin is warm and dry.  Neurological:     General: No focal deficit present.     Mental Status: He is alert and oriented to person, place, and time. Mental status is at baseline.  Psychiatric:        Mood and Affect: Mood normal.        Behavior: Behavior normal.        Thought Content: Thought content normal.        Judgment: Judgment normal.    Assessment and Plan: Primary hypertension -     Comprehensive metabolic panel with GFR -     Microalbumin / creatinine urine ratio  Dyslipidemia with elevated low density lipoprotein cholesterol and abnormally low high density lipoprotein cholesterol -     Lipid panel -     LDL cholesterol, direct  Encounter for preventive health examination Assessment & Plan: age appropriate education and counseling updated, referrals for preventative services and immunizations addressed, dietary and smoking counseling addressed, most recent labs reviewed.  I have personally reviewed and have noted:   1) the patient's medical and social history 2) The pt's use of alcohol, tobacco, and illicit drugs 3) The patient's current medications and supplements 4) Functional ability including ADL's, fall risk, home safety risk,  hearing and visual impairment 5) Diet and physical activities 6) Evidence for depression or mood disorder 7) The patient's height, weight, and BMI have been recorded in the chart    I have made referrals, and provided counseling and education based on review of the above    IFG (impaired fasting glucose) -     Comprehensive metabolic panel with GFR -     Hemoglobin A1c  Long-term use of high-risk medication -     CBC with Differential/Platelet -     TSH  B12 deficiency  Atrial fibrillation, unspecified type (HCC) Assessment & Plan: PAROXYSMA,L Managed with diltiazem  CD.  Likely due to untreated OSA  Due  to mask intolerance.   dR KLEIN HAS recommended addingSGLT 2 inhibitor  due to HFPEF   Need for influenza vaccination -     Flu vaccine HIGH DOSE PF(Fluzone Trivalent)  Chronic heart failure with preserved ejection fraction (HFpEF) (HCC) Assessment & Plan: Changing BP regimen based on review of EP Klein's December recs.  Stopping losartan .  Starting spironlactone and jardiance.    Hearing loss, sensorineural, asymmetrical Assessment & Plan: He tried in a hearing aid on his own that occluded his right canal and caused an otitis media which was treated by Herminio    Other orders -     Spironolactone; Take 1 tablet (50 mg total) by mouth daily.  Dispense: 90 tablet; Refill: 1 -     Empagliflozin; Take 1 tablet (10 mg total) by mouth daily before breakfast.  Dispense: 30 tablet; Refill: 2    No follow-ups on file.  Verneita LITTIE Kettering, MD

## 2024-06-27 NOTE — Assessment & Plan Note (Signed)
 He tried in a hearing aid on his own that occluded his right canal and caused an otitis media which was treated by Herminio

## 2024-06-27 NOTE — Assessment & Plan Note (Signed)
 PAROXYSMA,L Managed with diltiazem  CD.  Likely due to untreated OSA  Due to mask intolerance.   dR KLEIN HAS recommended addingSGLT 2 inhibitor  due to HFPEF

## 2024-06-27 NOTE — Assessment & Plan Note (Signed)
 Changing BP regimen based on review of EP Klein's December recs.  Stopping losartan .  Starting spironlactone and jardiance.

## 2024-06-27 NOTE — Patient Instructions (Addendum)
 Your kidney function  was off last year.   Your GFR (glomerular filtration rate) had dropped to < 60 ml/min which means  your  kidneys are not filtering as much blood per minute as they used to.  This can happen as a result of dehydration ,  medications (meloxicam  or other NSAIDS) ,  or kidney disease   If this is still present on today's labs,  I will have you suspend meloxicam   and return on a day you are well hydrated  .  You need about 60 ounces of water or non caffeinated beverages daily   to preserve kidney function .    Instead of meloxicam  ( or aleve, or motrin or advil)  use tylenol  . It will not affect kidneys, or stomach or BP  You can take 3000 mg of acetominophen (tylenol) every day safely  In divided doses (750 mg  every 6 hours  Or 1000 mg every 8 hours.)   B12 shots: ask your wife if she would like to take over your monthly B12 injections    I am changing your blood pressure regimen based on Dr celine comments after your December visit.  Stop the losartan /hcTZ AND start spironolactone and Jardiance.  Both have been sent to your pharmacy. The Jardiance will make you urinate, much like the hydrochlorothiazide did.  It is also protective of the kidneys   Check BP  once daily  after one week and send me a few readings  via phone

## 2024-06-27 NOTE — Assessment & Plan Note (Signed)

## 2024-06-30 ENCOUNTER — Ambulatory Visit: Payer: Self-pay | Admitting: Internal Medicine

## 2024-07-01 ENCOUNTER — Other Ambulatory Visit: Payer: Self-pay

## 2024-07-01 MED ORDER — EMPAGLIFLOZIN 10 MG PO TABS: 10.0000 mg | ORAL_TABLET | Freq: Every day | ORAL | 2 refills | Status: DC

## 2024-07-03 ENCOUNTER — Ambulatory Visit

## 2024-07-03 DIAGNOSIS — E538 Deficiency of other specified B group vitamins: Secondary | ICD-10-CM | POA: Diagnosis not present

## 2024-07-03 MED ORDER — CYANOCOBALAMIN 1000 MCG/ML IJ SOLN
1000.0000 ug | Freq: Once | INTRAMUSCULAR | Status: AC
  Administered 2024-07-03: 1000 ug via INTRAMUSCULAR

## 2024-07-03 NOTE — Progress Notes (Signed)
 Patient was administered a B12 injection into his left deltoid. Patient tolerated the B12 injection well.

## 2024-08-05 ENCOUNTER — Ambulatory Visit

## 2024-08-05 DIAGNOSIS — E538 Deficiency of other specified B group vitamins: Secondary | ICD-10-CM

## 2024-08-05 MED ORDER — CYANOCOBALAMIN 1000 MCG/ML IJ SOLN
1000.0000 ug | Freq: Once | INTRAMUSCULAR | Status: AC
  Administered 2024-08-05: 1000 ug via INTRAMUSCULAR

## 2024-08-05 NOTE — Progress Notes (Signed)
 Pt presented for their vitamin B12 injection. Pt was identified through two identifiers. Pt tolerated shot well in their right deltoid.

## 2024-08-26 ENCOUNTER — Other Ambulatory Visit: Payer: Self-pay

## 2024-08-26 MED ORDER — SERTRALINE HCL 100 MG PO TABS: ORAL_TABLET | ORAL | 1 refills | Status: AC

## 2024-09-09 ENCOUNTER — Ambulatory Visit (INDEPENDENT_AMBULATORY_CARE_PROVIDER_SITE_OTHER): Admitting: *Deleted

## 2024-09-09 DIAGNOSIS — E538 Deficiency of other specified B group vitamins: Secondary | ICD-10-CM | POA: Diagnosis not present

## 2024-09-09 MED ORDER — CYANOCOBALAMIN 1000 MCG/ML IJ SOLN
1000.0000 ug | Freq: Once | INTRAMUSCULAR | Status: AC
  Administered 2024-09-09: 1000 ug via INTRAMUSCULAR

## 2024-09-09 NOTE — Progress Notes (Signed)
Pt received B12 injection in left deltoid muscle. Pt tolerated it well with no complaints or concerns.  

## 2024-09-17 ENCOUNTER — Other Ambulatory Visit: Payer: Self-pay

## 2024-09-17 MED ORDER — EMPAGLIFLOZIN 10 MG PO TABS: 10.0000 mg | ORAL_TABLET | Freq: Every day | ORAL | 2 refills | Status: AC

## 2024-10-08 ENCOUNTER — Other Ambulatory Visit: Payer: Self-pay | Admitting: Student

## 2024-10-08 MED ORDER — DILTIAZEM HCL ER COATED BEADS 120 MG PO CP24: ORAL_CAPSULE | ORAL | 0 refills | Status: AC

## 2024-10-08 NOTE — Telephone Encounter (Signed)
 Valid encounter within last 12 months 10/24/24 w Beecher

## 2024-10-10 ENCOUNTER — Ambulatory Visit: Admitting: Physician Assistant

## 2024-10-14 ENCOUNTER — Ambulatory Visit

## 2024-10-23 ENCOUNTER — Ambulatory Visit: Payer: Medicare PPO

## 2024-10-24 ENCOUNTER — Ambulatory Visit: Admitting: Cardiology

## 2025-01-02 ENCOUNTER — Ambulatory Visit: Admitting: Internal Medicine

## 2025-04-03 ENCOUNTER — Ambulatory Visit: Admitting: Dermatology
